# Patient Record
Sex: Male | Born: 1940 | Race: White | Hispanic: No | Marital: Married | Smoking: Former smoker
Health system: Southern US, Community
[De-identification: ages and names within clinical notes are randomized; demographics above are authoritative.]

## PROBLEM LIST (undated history)

## (undated) DIAGNOSIS — C801 Malignant (primary) neoplasm, unspecified: Secondary | ICD-10-CM

## (undated) DIAGNOSIS — R0981 Nasal congestion: Secondary | ICD-10-CM

## (undated) DIAGNOSIS — C880 Waldenstrom macroglobulinemia: Secondary | ICD-10-CM

## (undated) DIAGNOSIS — R531 Weakness: Secondary | ICD-10-CM

## (undated) DIAGNOSIS — D510 Vitamin B12 deficiency anemia due to intrinsic factor deficiency: Secondary | ICD-10-CM

## (undated) DIAGNOSIS — R634 Abnormal weight loss: Secondary | ICD-10-CM

## (undated) DIAGNOSIS — J449 Chronic obstructive pulmonary disease, unspecified: Secondary | ICD-10-CM

## (undated) DIAGNOSIS — R509 Fever, unspecified: Secondary | ICD-10-CM

## (undated) DIAGNOSIS — R6883 Chills (without fever): Secondary | ICD-10-CM

## (undated) DIAGNOSIS — E785 Hyperlipidemia, unspecified: Secondary | ICD-10-CM

## (undated) DIAGNOSIS — F1910 Other psychoactive substance abuse, uncomplicated: Secondary | ICD-10-CM

## (undated) DIAGNOSIS — R591 Generalized enlarged lymph nodes: Secondary | ICD-10-CM

## (undated) HISTORY — DX: Other psychoactive substance abuse, uncomplicated: F19.10

## (undated) HISTORY — DX: Abnormal weight loss: R63.4

## (undated) HISTORY — DX: Waldenstrom macroglobulinemia: C88.0

## (undated) HISTORY — DX: Vitamin B12 deficiency anemia due to intrinsic factor deficiency: D51.0

## (undated) HISTORY — DX: Nasal congestion: R09.81

## (undated) HISTORY — DX: Chronic obstructive pulmonary disease, unspecified: J44.9

## (undated) HISTORY — DX: Weakness: R53.1

## (undated) HISTORY — DX: Hyperlipidemia, unspecified: E78.5

## (undated) HISTORY — DX: Malignant (primary) neoplasm, unspecified: C80.1

## (undated) HISTORY — DX: Fever, unspecified: R50.9

## (undated) HISTORY — DX: Chills (without fever): R68.83

## (undated) HISTORY — DX: Generalized enlarged lymph nodes: R59.1

---

## 1998-04-15 HISTORY — PX: BACK SURGERY: SHX140

## 1999-03-19 ENCOUNTER — Encounter: Payer: Self-pay | Admitting: Family Medicine

## 1999-03-19 ENCOUNTER — Encounter: Admission: RE | Admit: 1999-03-19 | Discharge: 1999-03-19 | Payer: Self-pay | Admitting: Family Medicine

## 1999-04-04 ENCOUNTER — Encounter: Payer: Self-pay | Admitting: Neurosurgery

## 1999-04-06 ENCOUNTER — Encounter: Payer: Self-pay | Admitting: Neurosurgery

## 1999-04-06 ENCOUNTER — Ambulatory Visit (HOSPITAL_COMMUNITY): Admission: RE | Admit: 1999-04-06 | Discharge: 1999-04-06 | Payer: Self-pay | Admitting: Neurosurgery

## 2001-10-07 ENCOUNTER — Encounter: Admission: RE | Admit: 2001-10-07 | Discharge: 2001-10-07 | Payer: Self-pay | Admitting: Family Medicine

## 2001-10-07 ENCOUNTER — Encounter: Payer: Self-pay | Admitting: Family Medicine

## 2003-08-22 ENCOUNTER — Encounter: Admission: RE | Admit: 2003-08-22 | Discharge: 2003-08-22 | Payer: Self-pay | Admitting: Family Medicine

## 2003-08-29 ENCOUNTER — Encounter: Admission: RE | Admit: 2003-08-29 | Discharge: 2003-08-29 | Payer: Self-pay | Admitting: Family Medicine

## 2003-09-21 ENCOUNTER — Ambulatory Visit (HOSPITAL_COMMUNITY): Admission: RE | Admit: 2003-09-21 | Discharge: 2003-09-21 | Payer: Self-pay | Admitting: Family Medicine

## 2004-08-13 ENCOUNTER — Emergency Department (HOSPITAL_COMMUNITY): Admission: EM | Admit: 2004-08-13 | Discharge: 2004-08-13 | Payer: Self-pay | Admitting: Family Medicine

## 2004-09-03 ENCOUNTER — Encounter: Admission: RE | Admit: 2004-09-03 | Discharge: 2004-09-03 | Payer: Self-pay | Admitting: Family Medicine

## 2004-09-19 ENCOUNTER — Ambulatory Visit: Payer: Self-pay | Admitting: Pulmonary Disease

## 2004-09-25 ENCOUNTER — Ambulatory Visit: Payer: Self-pay | Admitting: Pulmonary Disease

## 2004-10-09 ENCOUNTER — Encounter (INDEPENDENT_AMBULATORY_CARE_PROVIDER_SITE_OTHER): Payer: Self-pay | Admitting: *Deleted

## 2004-10-09 ENCOUNTER — Ambulatory Visit: Admission: RE | Admit: 2004-10-09 | Discharge: 2004-10-09 | Payer: Self-pay | Admitting: Pulmonary Disease

## 2004-10-09 ENCOUNTER — Ambulatory Visit: Payer: Self-pay | Admitting: Pulmonary Disease

## 2004-11-01 ENCOUNTER — Ambulatory Visit: Payer: Self-pay | Admitting: Pulmonary Disease

## 2004-12-31 ENCOUNTER — Encounter: Admission: RE | Admit: 2004-12-31 | Discharge: 2004-12-31 | Payer: Self-pay | Admitting: Pulmonary Disease

## 2008-08-10 ENCOUNTER — Encounter: Admission: RE | Admit: 2008-08-10 | Discharge: 2008-08-10 | Payer: Self-pay | Admitting: Family Medicine

## 2009-01-01 ENCOUNTER — Encounter: Payer: Self-pay | Admitting: Emergency Medicine

## 2009-01-02 ENCOUNTER — Inpatient Hospital Stay (HOSPITAL_COMMUNITY): Admission: RE | Admit: 2009-01-02 | Discharge: 2009-01-05 | Payer: Self-pay

## 2009-10-30 ENCOUNTER — Encounter: Admission: RE | Admit: 2009-10-30 | Discharge: 2009-10-30 | Payer: Self-pay | Admitting: Internal Medicine

## 2010-02-14 ENCOUNTER — Encounter (INDEPENDENT_AMBULATORY_CARE_PROVIDER_SITE_OTHER): Payer: Self-pay | Admitting: *Deleted

## 2010-03-05 ENCOUNTER — Encounter (INDEPENDENT_AMBULATORY_CARE_PROVIDER_SITE_OTHER): Payer: Self-pay | Admitting: *Deleted

## 2010-03-07 ENCOUNTER — Ambulatory Visit: Payer: Self-pay | Admitting: Gastroenterology

## 2010-03-27 ENCOUNTER — Ambulatory Visit: Payer: Self-pay | Admitting: Gastroenterology

## 2010-05-15 NOTE — Miscellaneous (Signed)
Summary: [facility] Previsit/prep  Clinical Lists Changes  Medications: Added new medication of MOVIPREP 100 GM  SOLR (PEG-KCL-NACL-NASULF-NA ASC-C) As per prep instructions. - Signed Rx of MOVIPREP 100 GM  SOLR (PEG-KCL-NACL-NASULF-NA ASC-C) As per prep instructions.;  #1 x 0;  Signed;  Entered by: Wyona Almas RN;  Authorized by: Louis Meckel MD;  Method used: Print then Give to Patient Observations: Added new observation of NKA: T (03/07/2010 7:43)    Prescriptions: MOVIPREP 100 GM  SOLR (PEG-KCL-NACL-NASULF-NA ASC-C) As per prep instructions.  #1 x 0   Entered by:   Wyona Almas RN   Authorized by:   Louis Meckel MD   Signed by:   Wyona Almas RN on 03/07/2010   Method used:   Print then Give to Patient   RxID:   [id number]   Appended Document: [facility] Previsit/prep    Clinical Lists Changes  Medications: Added new medication of MOVIPREP 100 GM  SOLR (PEG-KCL-NACL-NASULF-NA ASC-C) As per prep instructions. - Signed Removed medication of MOVIPREP 100 GM  SOLR (PEG-KCL-NACL-NASULF-NA ASC-C) As per prep instructions. Rx of MOVIPREP 100 GM  SOLR (PEG-KCL-NACL-NASULF-NA ASC-C) As per prep instructions.;  #1 x 0;  Signed;  Entered by: Wyona Almas RN;  Authorized by: Louis Meckel MD;  Method used: Electronically to [facility]  [phone]*, [address], [place], [place], [place]  [address], Ph: [phone] or [phone], Fax: [phone]    Prescriptions: MOVIPREP 100 GM  SOLR (PEG-KCL-NACL-NASULF-NA ASC-C) As per prep instructions.  #1 x 0   Entered by:   Wyona Almas RN   Authorized by:   Louis Meckel MD   Signed by:   Wyona Almas RN on 03/07/2010   Method used:   Electronically to        [facility]  [phone]* (retail)       [address]       [place], [place]  [address]       Ph: [phone] or [phone]       Fax: [phone]   RxID:   [id number]

## 2010-05-15 NOTE — Letter (Signed)
Summary: [facility] Instructions  [facility]  [address] [place], [place] [address]   Phone: [phone]  Fax: [phone]       [name] [name]    1940/12/19    MRN: [id number]        Procedure Day /Date: Tuesday 03-27-10     Arrival Time: 10:00 a.m.     Procedure Time: 11:00 a.m.     Location of Procedure:                    _x _   [facility] (4th Floor)                        PREPARATION FOR COLONOSCOPY WITH MOVIPREP   Starting 5 days prior to your procedure  03-22-10 do not eat nuts, seeds, popcorn, corn, beans, peas,  salads, or any raw vegetables.  Do not take any fiber supplements (e.g. Metamucil, Citrucel, and Benefiber).  THE DAY BEFORE YOUR PROCEDURE         DATE:  03-26-10  DAY:  Monday  1.  Drink clear liquids the entire day-NO SOLID FOOD  2.  Do not drink anything colored red or purple.  Avoid juices with pulp.  No orange juice.  3.  Drink at least 64 oz. (8 glasses) of fluid/clear liquids during the day to prevent dehydration and help the prep work efficiently.  CLEAR LIQUIDS INCLUDE: Water Jello Ice Popsicles Tea (sugar ok, no milk/cream) Powdered fruit flavored drinks Coffee (sugar ok, no milk/cream) Gatorade Juice: apple, white grape, white cranberry  Lemonade Clear bullion, consomm, broth Carbonated beverages (any kind) Strained chicken noodle soup Hard Candy                             4.  In the morning, mix first dose of MoviPrep solution:    Empty 1 Pouch A and 1 Pouch B into the disposable container    Add lukewarm drinking water to the top line of the container. Mix to dissolve    Refrigerate (mixed solution should be used within 24 hrs)  5.  Begin drinking the prep at 5:00 p.m. The MoviPrep container is divided by 4 marks.   Every 15 minutes drink the solution down to the next mark (approximately 8 oz) until the full liter is complete.   6.  Follow completed prep with 16 oz of clear liquid of your choice  (Nothing red or purple).  Continue to drink clear liquids until bedtime.  7.  Before going to bed, mix second dose of MoviPrep solution:    Empty 1 Pouch A and 1 Pouch B into the disposable container    Add lukewarm drinking water to the top line of the container. Mix to dissolve    Refrigerate  THE DAY OF YOUR PROCEDURE      DATE:  03-27-10   DAY:  Tuesday  Beginning at  6:00 a.m. (5 hours before procedure):         1. Every 15 minutes, drink the solution down to the next mark (approx 8 oz) until the full liter is complete.  2. Follow completed prep with 16 oz. of clear liquid of your choice.    3. You may drink clear liquids until  9:00 a.m. (2 HOURS BEFORE PROCEDURE).   MEDICATION INSTRUCTIONS  Unless otherwise instructed, you should take regular prescription medications with a small sip of water  as early as possible the morning of your procedure.         OTHER INSTRUCTIONS  You will need a responsible adult at least 69 years of age to accompany you and drive you home.   This person must remain in the waiting room during your procedure.  Wear loose fitting clothing that is easily removed.  Leave jewelry and other valuables at home.  However, you may wish to bring a book to read or  an iPod/MP3 player to listen to music as you wait for your procedure to start.  Remove all body piercing jewelry and leave at home.  Total time from sign-in until discharge is approximately 2-3 hours.  You should go home directly after your procedure and rest.  You can resume normal activities the  day after your procedure.  The day of your procedure you should not:   Drive   Make legal decisions   Operate machinery   Drink alcohol   Return to work  You will receive specific instructions about eating, activities and medications before you leave.    The above instructions have been reviewed and explained to me by   Wyona Almas RN  March 07, 2010 8:23 AM     I  fully understand and can verbalize these instructions _____________________________ Date _________

## 2010-05-15 NOTE — Letter (Signed)
Summary: Pre Visit Letter Revised  [facility]  [address] [place], [place] [address]   Phone: [phone]  Fax: [phone]        02/14/2010 MRN: [id number] [facility] [address] [place], [place]  [address]             Procedure Date:  03/27/2010   Welcome to the Gastroenterology Division at [facility].    You are scheduled to see a nurse for your pre-procedure visit on 03/07/2010 at 8:00AM on the 3rd floor at [facility], [address]  We ask that you try to arrive at our office 15 minutes prior to your appointment time to allow for check-in.  Please take a minute to review the attached form.  If you answer "Yes" to one or more of the questions on the first page, we ask that you call the person listed at your earliest opportunity.  If you answer "No" to all of the questions, please complete the rest of the form and bring it to your appointment.    Your nurse visit will consist of discussing your medical and surgical history, your immediate family medical history, and your medications.   If you are unable to list all of your medications on the form, please bring the medication bottles to your appointment and we will list them.  We will need to be aware of both prescribed and over the counter drugs.  We will need to know exact dosage information as well.    Please be prepared to read and sign documents such as consent forms, a financial agreement, and acknowledgement forms.  If necessary, and with your consent, a friend or relative is welcome to sit-in on the nurse visit with you.  Please bring your insurance card so that we may make a copy of it.  If your insurance requires a referral to see a specialist, please bring your referral form from your primary care physician.  No co-pay is required for this nurse visit.     If you cannot keep your appointment, please call [phone] to cancel or reschedule prior to your appointment date.  This  allows [place] the opportunity to schedule an appointment for another patient in need of care.    Thank you for choosing [facility] for your medical needs.  We appreciate the opportunity to care for you.  Please visit [place] at our website  to learn more about our practice.  Sincerely, The Gastroenterology Division

## 2010-05-17 NOTE — Procedures (Signed)
Summary: Colonoscopy  Patient: [name] [name] Note: All result statuses are Final unless otherwise noted.  Tests: (1) Colonoscopy (COL)   COL Colonoscopy           DONE      [facility]     [address]     [place], [place]  [address]           COLONOSCOPY PROCEDURE REPORT           PATIENT:  [name], [name]  MR#:  [id number]     BIRTHDATE:  [birth year], 69 yrs. old  GENDER:  male           ENDOSCOPIST:  [name] Hair. Arlyce Dice, MD     Referred by:  [name] [name], M.D.           PROCEDURE DATE:  03/27/2010     PROCEDURE:  Diagnostic Colonoscopy     ASA CLASS:  Class II     INDICATIONS:  1) screening  2) history of pre-cancerous     (adenomatous) colon polyps Index polypectomy 2006           MEDICATIONS:   Fentanyl 100 mcg IV, Versed 10 mg IV           DESCRIPTION OF PROCEDURE:   After the risks benefits and     alternatives of the procedure were thoroughly explained, informed     consent was obtained.  Digital rectal exam was performed and     revealed no abnormalities.   The LB CF-[id number] endoscope     was introduced through the anus and advanced to the cecum, which     was identified by both the appendix and ileocecal valve, without     limitations.  The quality of the prep was excellent, using     MoviPrep.  The instrument was then slowly withdrawn as the colon     was fully examined.     <<PROCEDUREIMAGES>>           FINDINGS:  Mild diverticulosis was found in the sigmoid colon (see     image2).  Diverticula were found in the ascending colon (see     image4).  This was otherwise a normal examination of the colon     (see image3, image6, image9, image10, image13, and image14).     Retroflexed views in the rectum revealed no abnormalities.    The     time to cecum =  4.0  minutes. The scope was then withdrawn (time     =  5.0  min) from the patient and the procedure completed.           COMPLICATIONS:  None           ENDOSCOPIC IMPRESSION:     1) Mild diverticulosis  in the sigmoid colon     2) Diverticula in the ascending colon     3) Otherwise normal examination     RECOMMENDATIONS:     1) Colonoscopy           REPEAT EXAM:   10 year(s) Colonoscopy           ______________________________     [name] Hair. Arlyce Dice, MD           CC:           n.     eSIGNED:   Barbette Hair. [name] at 03/27/2010 12:02 PM           [name] [name], [id number]  Note: An  exclamation mark (!) indicates a result that was not dispersed into the flowsheet. Document Creation Date: 03/27/2010 12:05 PM _______________________________________________________________________  (1) Order result status: Final Collection or observation date-time: 03/27/2010 11:56 Requested date-time:  Receipt date-time:  Reported date-time:  Referring Physician:   Ordering Physician: [name] [name] [phone]) Specimen Source:  Source: [name] [name] Order Number: [id number] Lab site:   Appended Document: Colonoscopy    Clinical Lists Changes  Observations: Added new observation of COLONNXTDUE: 03/2020 (03/27/2010 12:20)

## 2010-05-18 ENCOUNTER — Other Ambulatory Visit: Payer: Self-pay | Admitting: Dermatology

## 2010-07-20 LAB — BASIC METABOLIC PANEL
BUN: 6 mg/dL (ref 6–23)
BUN: 7 mg/dL (ref 6–23)
CO2: 30 mEq/L (ref 19–32)
Chloride: 96 mEq/L (ref 96–112)
Creatinine, Ser: 0.66 mg/dL (ref 0.4–1.5)
GFR calc non Af Amer: >60 mL/min (ref >60)
GFR calc non Af Amer: >60 mL/min (ref >60)
Potassium: 3.9 mEq/L (ref 3.5–5.1)
Potassium: 4.3 mEq/L (ref 3.5–5.1)
Sodium: 134 mEq/L — ABNORMAL LOW (ref 135–145)

## 2010-07-20 LAB — CBC
HCT: 37.3 % — ABNORMAL LOW (ref 39.0–52.0)
HCT: 33.7 % — ABNORMAL LOW (ref 39.0–52.0)
Hemoglobin: 11.6 g/dL — ABNORMAL LOW (ref 13.0–17.0)
Hemoglobin: 12.7 g/dL — ABNORMAL LOW (ref 13.0–17.0)
MCHC: 34.6 g/dL (ref 30.0–36.0)
MCV: 95.5 fL (ref 78.0–100.0)
MCV: 95.9 fL (ref 78.0–100.0)
MCV: 96.6 fL (ref 78.0–100.0)
Platelets: 171 10*3/uL (ref 150–400)
Platelets: 152 10*3/uL (ref 150–400)
Platelets: 172 10*3/uL (ref 150–400)
RDW: 13.1 % (ref 11.5–15.5)
RDW: 13.3 % (ref 11.5–15.5)
RDW: 14.1 % (ref 11.5–15.5)
WBC: 7.2 10*3/uL (ref 4.0–10.5)
WBC: 7.9 10*3/uL (ref 4.0–10.5)

## 2010-07-20 LAB — URINALYSIS, ROUTINE W REFLEX MICROSCOPIC
Bilirubin Urine: NEGATIVE
Hgb urine dipstick: NEGATIVE
Nitrite: NEGATIVE
Protein, ur: NEGATIVE mg/dL
pH: 6 (ref 5.0–8.0)

## 2010-07-20 LAB — COMPREHENSIVE METABOLIC PANEL
ALT: 21 U/L (ref 0–53)
CO2: 25 mEq/L (ref 19–32)
Creatinine, Ser: 0.75 mg/dL (ref 0.4–1.5)
GFR calc Af Amer: >60 mL/min (ref >60)
GFR calc non Af Amer: >60 mL/min (ref >60)
Glucose, Bld: 125 mg/dL — ABNORMAL HIGH (ref 70–99)
Potassium: 4.1 mEq/L (ref 3.5–5.1)
Sodium: 132 mEq/L — ABNORMAL LOW (ref 135–145)
Total Bilirubin: 0.4 mg/dL (ref 0.3–1.2)
Total Protein: 7.1 g/dL (ref 6.0–8.3)

## 2010-07-20 LAB — POCT CARDIAC MARKERS: Troponin i, poc: <0.05 ng/mL (ref 0.00–0.09)

## 2010-07-20 LAB — DIFFERENTIAL
Basophils Absolute: 0 10*3/uL (ref 0.0–0.1)
Basophils Relative: 0 % (ref 0–1)
Lymphs Abs: 2.5 10*3/uL (ref 0.7–4.0)
Monocytes Absolute: 1.3 10*3/uL — ABNORMAL HIGH (ref 0.1–1.0)
Neutrophils Relative %: 66 % (ref 43–77)

## 2010-07-20 LAB — PROTIME-INR: Prothrombin Time: 12.8 seconds (ref 11.6–15.2)

## 2010-07-20 LAB — TYPE AND SCREEN

## 2010-07-20 LAB — APTT: aPTT: 26 seconds (ref 24–37)

## 2010-07-20 LAB — ABO/RH: ABO/RH(D): A NEG

## 2010-08-31 NOTE — Op Note (Signed)
NAME:  [name] [name], [name] [name]                  ACCOUNT NO.:  [id number]   MEDICAL RECORD NO.:  [id number]          PATIENT TYPE:  AMB   LOCATION:  CARD                         FACILITY:  [facility]   PHYSICIAN:  Marcelyn Bruins, M.D. [facility] DATE OF BIRTH:  Jun 26, 1940   DATE OF PROCEDURE:  10/09/2004  DATE OF DISCHARGE:                                 OPERATIVE REPORT   PROCEDURE:  Flexible fiberoptic bronchoscopy with biopsy.   INDICATION:  Innumerable pulmonary nodules of unknown etiology.   OPERATOR:  Dr. Shelle Iron.   ANESTHESIA:  Demerol 100 mg IV, Versed 10 mg IV, and topical 1% lidocaine in  both __________ and airways during the procedure.   DESCRIPTION:  After obtaining informed consent and under close  cardiopulmonary monitoring, the above preop anesthesia was given, and the  fiberoptic scope was passed through the right naris and into the posterior  pharynx where there were no lesions or other abnormalities seen.  Vocal  cords appeared to be within normal limits and moved bilaterally with  phonation.  The scope was then passed into the trachea where it was examined  along its entire length down to the level of the carina all of which was  normal.  The left and right tracheobronchial trees were examined serially to  the subsegmental level with no endobronchial abnormality being found.  Bronchoalveolar lavage was then done from the right upper lobe and the right  lower lobe with good return being obtained.  This was sent for the usual  cytologic and bacteriologic evaluation as well as cell count and  differential.  Transbronchial biopsies were then done under fluoroscopic  guidance from the right lower lobe and right middle lobe with good biopsies  and specimens being obtained.  There was no obvious pneumothorax using  fluoroscopy postprocedure.  Overall, the patient tolerated the procedure  well, and there were no complications.  A chest x-ray is pending at time of  dictation to formally rule out  pneumothorax postbiopsy.       KC/MEDQ  D:  10/09/2004  T:  10/09/2004  Job:  [id number]

## 2011-05-17 ENCOUNTER — Ambulatory Visit: Payer: Medicare Other

## 2011-05-17 ENCOUNTER — Ambulatory Visit (INDEPENDENT_AMBULATORY_CARE_PROVIDER_SITE_OTHER): Payer: Medicare Other | Admitting: Family Medicine

## 2011-05-17 VITALS — BP 112/68 | HR 96 | Temp 97.5°F | Resp 20 | Ht 68.0 in | Wt 148.8 lb

## 2011-05-17 DIAGNOSIS — R0600 Dyspnea, unspecified: Secondary | ICD-10-CM

## 2011-05-17 DIAGNOSIS — E785 Hyperlipidemia, unspecified: Secondary | ICD-10-CM

## 2011-05-17 DIAGNOSIS — R0609 Other forms of dyspnea: Secondary | ICD-10-CM

## 2011-05-17 DIAGNOSIS — R634 Abnormal weight loss: Secondary | ICD-10-CM

## 2011-05-17 DIAGNOSIS — H902 Conductive hearing loss, unspecified: Secondary | ICD-10-CM

## 2011-05-17 LAB — CBC WITH DIFFERENTIAL/PLATELET
Basophils Absolute: 0 10*3/uL (ref 0.0–0.1)
Basophils Relative: 1 % (ref 0–1)
Eosinophils Absolute: 0.1 10*3/uL (ref 0.0–0.7)
Eosinophils Relative: 2 % (ref 0–5)
HCT: 33.4 % — ABNORMAL LOW (ref 39.0–52.0)
Hemoglobin: 10.3 g/dL — ABNORMAL LOW (ref 13.0–17.0)
Lymphocytes Relative: 26 % (ref 12–46)
Lymphs Abs: 2.1 10*3/uL (ref 0.7–4.0)
MCH: 24.4 pg — ABNORMAL LOW (ref 26.0–34.0)
MCHC: 30.8 g/dL (ref 30.0–36.0)
MCV: 79.1 fL (ref 78.0–100.0)
Monocytes Absolute: 1.3 10*3/uL — ABNORMAL HIGH (ref 0.1–1.0)
Monocytes Relative: 17 % — ABNORMAL HIGH (ref 3–12)
Neutro Abs: 4.6 10*3/uL (ref 1.7–7.7)
Neutrophils Relative %: 57 % (ref 43–77)
Platelets: 297 10*3/uL (ref 150–400)
RBC: 4.22 MIL/uL (ref 4.22–5.81)
RDW: 15.8 % — ABNORMAL HIGH (ref 11.5–15.5)
WBC: 8.1 10*3/uL (ref 4.0–10.5)

## 2011-05-17 LAB — POCT UA - MICROSCOPIC ONLY
Bacteria, U Microscopic: NEGATIVE
Casts, Ur, LPF, POC: NEGATIVE
Crystals, Ur, HPF, POC: NEGATIVE
Epithelial cells, urine per micros: NEGATIVE
Yeast, UA: NEGATIVE

## 2011-05-17 LAB — COMPREHENSIVE METABOLIC PANEL
ALT: <8 U/L (ref 0–53)
AST: 7 U/L (ref 0–37)
Albumin: 3.1 g/dL — ABNORMAL LOW (ref 3.5–5.2)
Alkaline Phosphatase: 64 U/L (ref 39–117)
BUN: 10 mg/dL (ref 6–23)
CO2: 25 mEq/L (ref 19–32)
Calcium: 9.3 mg/dL (ref 8.4–10.5)
Chloride: 98 mEq/L (ref 96–112)
Creat: 0.68 mg/dL (ref 0.50–1.35)
Glucose, Bld: 107 mg/dL — ABNORMAL HIGH (ref 70–99)
Potassium: 4.6 mEq/L (ref 3.5–5.3)
Sodium: 137 mEq/L (ref 135–145)
Total Bilirubin: 0.5 mg/dL (ref 0.3–1.2)
Total Protein: 7.9 g/dL (ref 6.0–8.3)

## 2011-05-17 LAB — POCT URINALYSIS DIPSTICK
Blood, UA: NEGATIVE
Glucose, UA: NEGATIVE
Leukocytes, UA: NEGATIVE
Nitrite, UA: NEGATIVE
Spec Grav, UA: 1.015
Urobilinogen, UA: 1
pH, UA: 6

## 2011-05-17 LAB — IFOBT (OCCULT BLOOD): IFOBT: NEGATIVE

## 2011-05-17 LAB — POCT SEDIMENTATION RATE: POCT SED RATE: 123 mm/hr — AB (ref 0–22)

## 2011-05-17 LAB — TSH: TSH: 3.518 u[IU]/mL (ref 0.350–4.500)

## 2011-05-17 NOTE — Patient Instructions (Signed)
Stop drinking alcohol and return for lab results on Monday morning

## 2011-05-17 NOTE — Progress Notes (Signed)
  Subjective:    Patient ID: [name] [name], male    DOB: [birth year], 71 y.o.   MRN: [id number]  Shortness of Breath This is a chronic problem. The current episode started more than 1 year ago. The problem occurs constantly. The problem has been gradually worsening. Pertinent negatives include no abdominal pain, chest pain, claudication, coryza, ear pain, fever, headaches, orthopnea, PND, rash, rhinorrhea, sore throat, sputum production, syncope, vomiting or wheezing. Hemoptysis: night sweats. The symptoms are aggravated by any activity and occupational exposure. Risk factors include no known risk factors. He has tried beta agonist inhalers for the symptoms. The treatment provided no relief.   Ex-smoker   Review of Systems  Constitutional: Positive for chills and fatigue. Negative for fever.  HENT: Negative for ear pain, congestion, sore throat, facial swelling, rhinorrhea and neck stiffness.   Respiratory: Positive for shortness of breath. Negative for cough (rare nonproductive cough), sputum production and wheezing. Hemoptysis: night sweats.   Cardiovascular: Negative for chest pain, orthopnea, claudication, syncope and PND.  Gastrointestinal: Negative for vomiting and abdominal pain.  Musculoskeletal: Negative.   Skin: Negative for rash.  Neurological: Negative for headaches.  Hematological: Negative.        Objective:   Physical Exam  Constitutional: He is oriented to person, place, and time. He appears well-developed and well-nourished.  HENT:  Head: Normocephalic and atraumatic.  Mouth/Throat: Oropharynx is clear and moist.  Eyes: Conjunctivae are normal. Pupils are equal, round, and reactive to light.  Neck: Normal range of motion. Neck supple.  Cardiovascular: Normal rate, regular rhythm, normal heart sounds and intact distal pulses.   Pulmonary/Chest: Effort normal and breath sounds normal.  Abdominal: Soft. Bowel sounds are normal.  Musculoskeletal: Normal range of motion.    Neurological: He is alert and oriented to person, place, and time.  Skin: Skin is warm and dry.  Psychiatric: He has a normal mood and affect. His behavior is normal.   [facility] reading (PRIMARY) by  Dr. Milus Glazier:  Old left rib fx's.  No mass  ESR 123 U/A neg  I spent the better part of an hour with patient and significant other  Pulse ox was 90 initially and went up to 95 with walking     Assessment & Plan:  Weight loss and dyspnea on exertion which needs further investigation.  The former may be from alcohol overuse and the latter COPD.  We will need further referrals which have been made.  Also we will get an audiology referral.

## 2011-05-18 ENCOUNTER — Encounter: Payer: Self-pay | Admitting: Family Medicine

## 2011-05-20 ENCOUNTER — Ambulatory Visit (INDEPENDENT_AMBULATORY_CARE_PROVIDER_SITE_OTHER): Payer: Medicare Other | Admitting: Family Medicine

## 2011-05-20 ENCOUNTER — Encounter: Payer: Self-pay | Admitting: Family Medicine

## 2011-05-20 ENCOUNTER — Telehealth: Payer: Self-pay

## 2011-05-20 VITALS — BP 108/68 | HR 100 | Temp 98.8°F | Resp 16 | Ht 68.5 in | Wt 157.0 lb

## 2011-05-20 DIAGNOSIS — F1020 Alcohol dependence, uncomplicated: Secondary | ICD-10-CM

## 2011-05-20 DIAGNOSIS — J449 Chronic obstructive pulmonary disease, unspecified: Secondary | ICD-10-CM | POA: Insufficient documentation

## 2011-05-20 DIAGNOSIS — R0600 Dyspnea, unspecified: Secondary | ICD-10-CM

## 2011-05-20 DIAGNOSIS — R634 Abnormal weight loss: Secondary | ICD-10-CM

## 2011-05-20 DIAGNOSIS — D649 Anemia, unspecified: Secondary | ICD-10-CM

## 2011-05-20 DIAGNOSIS — F1010 Alcohol abuse, uncomplicated: Secondary | ICD-10-CM

## 2011-05-20 DIAGNOSIS — R5381 Other malaise: Secondary | ICD-10-CM

## 2011-05-20 MED ORDER — ALBUTEROL SULFATE HFA 108 (90 BASE) MCG/ACT IN AERS: 2.0000 | INHALATION_SPRAY | Freq: Four times a day (QID) | RESPIRATORY_TRACT | Status: DC | PRN

## 2011-05-20 MED ORDER — METHYLPREDNISOLONE 4 MG PO KIT: PACK | ORAL | Status: AC

## 2011-05-20 MED ORDER — PRENATAL RX 60-1 MG PO TABS: 1.0000 | ORAL_TABLET | Freq: Every day | ORAL | Status: DC

## 2011-05-20 NOTE — Telephone Encounter (Signed)
Patient wife called to let Dr. Milus Glazier know that patient would not be coming in till after his ct scan instead of coming in Tuesday at 430pm, patient will come in on tues or Friday of next week.

## 2011-05-20 NOTE — Progress Notes (Signed)
71-year-old gentleman comes in for a recheck. His main complaint is weakness of the legs and 30 pound weight loss over the last 3 months. In addition he has hearing loss and dyspnea. I saw him today with his ex-wife who takes care of him. She explains that he drinks 3 sixpacks of beer every day and that he just sits around and does nothing.  The patient had a colonoscopy several years ago and told that he did not have to come back for 10 years patient denies cough nausea vomiting blood in stool or chest pain. HBG 10.7, ESR 123  Objective: Labs were reviewed today. The anemia in particular was identified and the patient was told that he should not drink anymore.  Chest was clear abdomen was soft without hepatosplenomegaly, and no tenderness. He is alert and cooperative and was ambulating normally.  Assessment: Abnormal weight loss possibly related to alcoholism but also there is a possibility the patient has an occult cancer.  Plan: CT of abdomen, prenatal vitamins, referrals have been made to audiology. I will see the patient back in one week and he is instructed to avoid alcohol. I spent 35 minutes with the patient and his ex-wife outlining the plan and reviewing laboratory results.

## 2011-05-20 NOTE — Patient Instructions (Signed)
Return next Monday around 8:15

## 2011-05-21 NOTE — Telephone Encounter (Signed)
See message. FYI. 

## 2011-05-22 ENCOUNTER — Ambulatory Visit
Admission: RE | Admit: 2011-05-22 | Discharge: 2011-05-22 | Disposition: A | Discharge status: Home / Self Care | Payer: Medicare Other | Source: Ambulatory Visit | Attending: Family Medicine | Admitting: Family Medicine

## 2011-05-22 DIAGNOSIS — R634 Abnormal weight loss: Secondary | ICD-10-CM

## 2011-05-22 MED ORDER — IOHEXOL 300 MG/ML  SOLN
100.0000 mL | Freq: Once | INTRAMUSCULAR | Status: AC | PRN
  Administered 2011-05-22: 100 mL via INTRAVENOUS

## 2011-05-23 ENCOUNTER — Telehealth: Payer: Self-pay

## 2011-05-23 NOTE — Telephone Encounter (Signed)
.  [facility] BETTY WOULD LIKE TO KNOW RESULTS OF HER HUSBAND'S MRI PLEASE CALL HER AT [phone], HE IS HARD OF HEARING

## 2011-05-24 ENCOUNTER — Other Ambulatory Visit: Payer: Self-pay | Admitting: Internal Medicine

## 2011-05-24 DIAGNOSIS — R634 Abnormal weight loss: Secondary | ICD-10-CM

## 2011-05-24 DIAGNOSIS — R1909 Other intra-abdominal and pelvic swelling, mass and lump: Secondary | ICD-10-CM

## 2011-05-24 NOTE — Telephone Encounter (Signed)
Ct scan done on Feb 4th showed mass in the right groin.  A follow-up study (Pet scan) has been ordered today to get a closer look at this area to see what the next step is.  Please follow-up here in the clinic a few days after the Pet scan is done so Dr L. Can see how pt is doing.

## 2011-05-25 NOTE — Telephone Encounter (Signed)
Spoke with wife and informed her of the results.  Wife states understanding and will call back if any questions or concerns

## 2011-05-28 ENCOUNTER — Institutional Professional Consult (permissible substitution): Payer: Self-pay | Admitting: Critical Care Medicine

## 2011-05-31 ENCOUNTER — Telehealth: Payer: Self-pay

## 2011-05-31 NOTE — Telephone Encounter (Signed)
[employer] called about this pts prior auth for a PET scan. They require addtl info before they can approve. They require labs, previous imaging results, documentation of findings/symptoms, or any info that will prove the existence of a solid tumor before they will approve PET scan. [facility] phone [phone] Fax [phone] Case # [id number]

## 2011-06-01 NOTE — Telephone Encounter (Signed)
[name] [name],   Please get this started.  Thanks, [employer]

## 2011-06-03 NOTE — Telephone Encounter (Signed)
Called [employer] back to check status of precert. PET has been approved. Notification #[id number], exp 07/18/11

## 2011-06-03 NOTE — Telephone Encounter (Signed)
Called [employer] and provided add'l clinical information. Had to go to Phys review, but marked "urgent" - should receive fax within 3 hours with decision if PET approved.

## 2011-06-10 NOTE — Telephone Encounter (Signed)
I'm sure that I routed this back to you after receiving this notification, but am not seeing it on routing hx, so I'll send it again.

## 2011-06-10 NOTE — Telephone Encounter (Signed)
This appears to have been approved so it likely needs to go to referrals to be scheduled.  Mikle Sternberg

## 2011-06-10 NOTE — Telephone Encounter (Signed)
I also had routed message back to Referrals and talked with Lupita Leash to make sure she was aware.

## 2011-06-13 ENCOUNTER — Encounter (HOSPITAL_COMMUNITY)
Admission: RE | Admit: 2011-06-13 | Discharge: 2011-06-13 | Disposition: A | Discharge status: Home / Self Care | Payer: Medicare Other | Source: Ambulatory Visit | Attending: Internal Medicine | Admitting: Internal Medicine

## 2011-06-13 DIAGNOSIS — R634 Abnormal weight loss: Secondary | ICD-10-CM

## 2011-06-13 DIAGNOSIS — R599 Enlarged lymph nodes, unspecified: Secondary | ICD-10-CM | POA: Insufficient documentation

## 2011-06-13 DIAGNOSIS — Z85828 Personal history of other malignant neoplasm of skin: Secondary | ICD-10-CM | POA: Insufficient documentation

## 2011-06-13 DIAGNOSIS — I2510 Atherosclerotic heart disease of native coronary artery without angina pectoris: Secondary | ICD-10-CM | POA: Insufficient documentation

## 2011-06-13 DIAGNOSIS — R1909 Other intra-abdominal and pelvic swelling, mass and lump: Secondary | ICD-10-CM

## 2011-06-13 MED ORDER — FLUDEOXYGLUCOSE F - 18 (FDG) INJECTION
22.0000 | Freq: Once | INTRAVENOUS | Status: AC | PRN
  Administered 2011-06-13: 19.9 via INTRAVENOUS

## 2011-06-15 ENCOUNTER — Other Ambulatory Visit: Payer: Self-pay | Admitting: Family Medicine

## 2011-06-15 DIAGNOSIS — R591 Generalized enlarged lymph nodes: Secondary | ICD-10-CM

## 2011-06-19 ENCOUNTER — Ambulatory Visit (INDEPENDENT_AMBULATORY_CARE_PROVIDER_SITE_OTHER): Payer: Medicare Other | Admitting: Family Medicine

## 2011-06-19 VITALS — BP 98/59 | HR 94 | Temp 98.5°F | Resp 16 | Ht 68.0 in | Wt 145.0 lb

## 2011-06-19 DIAGNOSIS — H9190 Unspecified hearing loss, unspecified ear: Secondary | ICD-10-CM | POA: Insufficient documentation

## 2011-06-19 DIAGNOSIS — R634 Abnormal weight loss: Secondary | ICD-10-CM

## 2011-06-19 NOTE — Progress Notes (Signed)
71 yo man with progressive weight loss, night sweats, weakness, knee pain, but good appetite for the past 1 year.  Since he was seen here 1 month ago, he has completely stopped alcohol.  He continues to lose weight, though. 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  We still don't have an oncology referral made.  O:  Seen with ex-wife Alert. 7-8 cm smooth liver edge MCL RUQ, nontender.  No masses. Heart reg, 80 bpm, no murmur Chest: clear Skin:  Clear.  A:  Suspect lymphoma  P:  Consult Arlan Organ, MD

## 2011-06-21 ENCOUNTER — Telehealth: Payer: Self-pay | Admitting: Hematology & Oncology

## 2011-06-21 NOTE — Telephone Encounter (Signed)
Left pt message to call for appointment °

## 2011-06-28 ENCOUNTER — Other Ambulatory Visit (HOSPITAL_BASED_OUTPATIENT_CLINIC_OR_DEPARTMENT_OTHER): Payer: Medicare Other | Admitting: Lab

## 2011-06-28 ENCOUNTER — Ambulatory Visit (HOSPITAL_BASED_OUTPATIENT_CLINIC_OR_DEPARTMENT_OTHER): Payer: Medicare Other | Admitting: Hematology & Oncology

## 2011-06-28 ENCOUNTER — Ambulatory Visit: Payer: Medicare Other

## 2011-06-28 VITALS — BP 101/62 | HR 94 | Temp 97.2°F | Ht 69.0 in | Wt 147.0 lb

## 2011-06-28 DIAGNOSIS — C8590 Non-Hodgkin lymphoma, unspecified, unspecified site: Secondary | ICD-10-CM

## 2011-06-28 DIAGNOSIS — D649 Anemia, unspecified: Secondary | ICD-10-CM

## 2011-06-28 DIAGNOSIS — R599 Enlarged lymph nodes, unspecified: Secondary | ICD-10-CM

## 2011-06-28 LAB — CHCC SATELLITE - SMEAR

## 2011-06-28 LAB — CBC WITH DIFFERENTIAL (CANCER CENTER ONLY)
Eosinophils Absolute: 0 10*3/uL (ref 0.0–0.5)
MONO#: 1.3 10*3/uL — ABNORMAL HIGH (ref 0.1–0.9)
MONO%: 19 % — ABNORMAL HIGH (ref 0.0–13.0)
NEUT#: 3.6 10*3/uL (ref 1.5–6.5)
Platelets: 245 10*3/uL (ref 145–400)
RBC: 3.8 10*6/uL — ABNORMAL LOW (ref 4.20–5.70)
WBC: 6.7 10*3/uL (ref 4.0–10.0)
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
## 2011-06-28 NOTE — Progress Notes (Signed)
This office note has been dictated.

## 2011-07-02 LAB — PROTEIN ELECTROPHORESIS, SERUM, WITH REFLEX
Beta 2: 31.5 % — ABNORMAL HIGH (ref 3.2–6.5)
Beta Globulin: 5.5 % (ref 4.7–7.2)
Gamma Globulin: 3.6 % — ABNORMAL LOW (ref 11.1–18.8)
M-Spike, %: 2.09 g/dL
Total Protein, Serum Electrophoresis: 6.9 g/dL (ref 6.0–8.3)

## 2011-07-02 LAB — COMPREHENSIVE METABOLIC PANEL
ALT: <8 U/L (ref 0–53)
Albumin: 2.9 g/dL — ABNORMAL LOW (ref 3.5–5.2)
Alkaline Phosphatase: 60 U/L (ref 39–117)
CO2: 25 mEq/L (ref 19–32)
Glucose, Bld: 98 mg/dL (ref 70–99)
Potassium: 4.9 mEq/L (ref 3.5–5.3)
Sodium: 135 mEq/L (ref 135–145)
Total Protein: 6.9 g/dL (ref 6.0–8.3)

## 2011-07-02 LAB — IFE INTERPRETATION

## 2011-07-02 LAB — KAPPA/LAMBDA LIGHT CHAINS
Kappa:Lambda Ratio: 277.27 — ABNORMAL HIGH (ref 0.26–1.65)
Lambda Free Lght Chn: 0.11 mg/dL — ABNORMAL LOW (ref 0.57–2.63)

## 2011-07-02 LAB — IGG, IGA, IGM: IgG (Immunoglobin G), Serum: 262 mg/dL — ABNORMAL LOW (ref 650–1600)

## 2011-07-04 ENCOUNTER — Encounter (INDEPENDENT_AMBULATORY_CARE_PROVIDER_SITE_OTHER): Payer: Self-pay

## 2011-07-05 ENCOUNTER — Ambulatory Visit (INDEPENDENT_AMBULATORY_CARE_PROVIDER_SITE_OTHER): Payer: Medicare Other | Admitting: Surgery

## 2011-07-05 ENCOUNTER — Encounter (INDEPENDENT_AMBULATORY_CARE_PROVIDER_SITE_OTHER): Payer: Self-pay | Admitting: Surgery

## 2011-07-05 VITALS — BP 110/58 | HR 68 | Temp 97.9°F | Resp 16 | Ht 68.0 in | Wt 146.6 lb

## 2011-07-05 DIAGNOSIS — R591 Generalized enlarged lymph nodes: Secondary | ICD-10-CM

## 2011-07-05 DIAGNOSIS — R634 Abnormal weight loss: Secondary | ICD-10-CM

## 2011-07-05 DIAGNOSIS — R599 Enlarged lymph nodes, unspecified: Secondary | ICD-10-CM
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
## 2011-07-05 NOTE — Patient Instructions (Signed)
1.  I will speak to Dr. Myna Hidalgo, then call Mrs. Dawood.

## 2011-07-06 DIAGNOSIS — R591 Generalized enlarged lymph nodes: Secondary | ICD-10-CM | POA: Insufficient documentation

## 2011-07-08 ENCOUNTER — Telehealth: Payer: Self-pay | Admitting: *Deleted

## 2011-07-08 ENCOUNTER — Other Ambulatory Visit: Payer: Self-pay | Admitting: Hematology & Oncology

## 2011-07-08 DIAGNOSIS — R599 Enlarged lymph nodes, unspecified: Secondary | ICD-10-CM

## 2011-07-09 ENCOUNTER — Ambulatory Visit (HOSPITAL_BASED_OUTPATIENT_CLINIC_OR_DEPARTMENT_OTHER): Payer: Medicare Other | Admitting: Hematology & Oncology

## 2011-07-09 ENCOUNTER — Other Ambulatory Visit (HOSPITAL_BASED_OUTPATIENT_CLINIC_OR_DEPARTMENT_OTHER): Payer: Medicare Other | Admitting: Lab

## 2011-07-09 ENCOUNTER — Ambulatory Visit: Payer: Medicare Other

## 2011-07-09 ENCOUNTER — Other Ambulatory Visit (HOSPITAL_COMMUNITY)
Admission: RE | Admit: 2011-07-09 | Discharge: 2011-07-09 | Disposition: A | Discharge status: Home / Self Care | Payer: Medicare Other | Source: Ambulatory Visit | Attending: Hematology & Oncology | Admitting: Hematology & Oncology

## 2011-07-09 VITALS — BP 96/51 | HR 89 | Temp 97.2°F | Wt 141.0 lb

## 2011-07-09 DIAGNOSIS — R591 Generalized enlarged lymph nodes: Secondary | ICD-10-CM

## 2011-07-09 DIAGNOSIS — R599 Enlarged lymph nodes, unspecified: Secondary | ICD-10-CM

## 2011-07-09 DIAGNOSIS — D509 Iron deficiency anemia, unspecified: Secondary | ICD-10-CM | POA: Insufficient documentation

## 2011-07-09 DIAGNOSIS — D6481 Anemia due to antineoplastic chemotherapy: Secondary | ICD-10-CM

## 2011-07-09 DIAGNOSIS — C8589 Other specified types of non-Hodgkin lymphoma, extranodal and solid organ sites: Secondary | ICD-10-CM | POA: Insufficient documentation

## 2011-07-09 LAB — CBC WITH DIFFERENTIAL (CANCER CENTER ONLY)
BASO%: 0.5 % (ref 0.0–2.0)
EOS%: 0.3 % (ref 0.0–7.0)
LYMPH#: 2.1 10*3/uL (ref 0.9–3.3)
MONO#: 1.6 10*3/uL — ABNORMAL HIGH (ref 0.1–0.9)
NEUT#: 3.9 10*3/uL (ref 1.5–6.5)
Platelets: 231 10*3/uL (ref 145–400)
RDW: 16.2 % — ABNORMAL HIGH (ref 11.1–15.7)
WBC: 7.7 10*3/uL (ref 4.0–10.0)

## 2011-07-09 NOTE — Progress Notes (Unsigned)
Opened in error

## 2011-07-09 NOTE — Progress Notes (Signed)
This office note has been dictated.

## 2011-07-09 NOTE — Patient Instructions (Signed)
Bone Marrow Aspiration, Bone Marrow Biopsy Care After Read the instructions outlined below and refer to this sheet in the next few weeks. These discharge instructions provide you with general information on caring for yourself after you leave the hospital. Your caregiver may also give you specific instructions. While your treatment has been planned according to the most current medical practices available, unavoidable complications occasionally occur. If you have any problems or questions after discharge, call Dr. Myna Hidalgo.  FINDING OUT THE RESULTS OF YOUR TEST Not all test results are available during your visit. If your test results are not back during the visit, make an appointment with your caregiver to find out the results. Do not assume everything is normal if you have not heard from your caregiver or the medical facility. It is important for you to follow up on all of your test results.   HOME CARE INSTRUCTIONS   Only take over-the-counter or prescription medicines for pain, discomfort, and or fever as directed by your caregiver.    Keep your dressing clean and dry. You may replace dressing with a bandage after 24 hours.   You may take a bath or shower after 24 hours.   Use an ice pack for 20 minutes every 2 hours while awake for pain as needed.  SEEK MEDICAL CARE IF:   There is redness, swelling, or increasing pain at the biopsy site.   There is pus coming from the biopsy site.   There is drainage from a biopsy site lasting longer than one day.   An unexplained oral temperature above 102 F (38.9 C) develops.  SEEK IMMEDIATE MEDICAL CARE IF:   You develop a rash.   You have difficulty breathing.   You develop any reaction or side effects to medications given.  Document Released: 10/19/2004 Document Revised: 03/21/2011 Document Reviewed: 03/29/2008 [facility] Patient Information 2012 [place], [place].

## 2011-07-09 NOTE — Procedures (Signed)
[name] [name] was brought to the treatment room at the [facility].  We needed to do a bone marrow biopsy on him for his lymphadenopathy.  He signed his consent form.  We did the appropriate time-out procedure.  His left posterior iliac crest region was prepped and draped in sterile fashion.  10 cc of 2% lidocaine was infiltrated under the skin down to the periosteum.  A #11 scalpel was used to make an incision into the skin.  We then obtained an aspirate.  Unfortunately, the aspirate was very dilute without any spicules.  I then tried a second aspirate without much success.  I then used a Jamshidi biopsy needle.  I got about a 5 cm core.  This core will be sent for flow cytometry and cytogenetics.  We will also have it sent off for pathology.  [name] [name] tolerated the procedure well.  I dressed the entry site in the left posterior iliac crest region sterilely.  There were no complications with the procedure.    ______________________________ Josph Macho, M.D. PRE/MEDQ  D:  07/09/2011  T:  07/09/2011  Job:  [id number]

## 2011-07-09 NOTE — Progress Notes (Unsigned)
[facility] BONE MARROW BIOPSY/ASPIRATE PROGRESS NOTE  Patient tolerated well. Pressure dressing applied to the left hip with instructions to leave in place for 24 hours. Patient instructed to report any bleeding that saturates dressing and to take pain medication Tylenol as directed. Dressing dry and intact to the left hip on discharge.

## 2011-07-10 ENCOUNTER — Other Ambulatory Visit: Payer: Self-pay | Admitting: Hematology & Oncology

## 2011-07-10 ENCOUNTER — Encounter: Payer: Self-pay | Admitting: Hematology & Oncology

## 2011-07-10 ENCOUNTER — Telehealth: Payer: Self-pay | Admitting: Hematology & Oncology

## 2011-07-10 DIAGNOSIS — C880 Waldenstrom macroglobulinemia: Secondary | ICD-10-CM

## 2011-07-10 HISTORY — DX: Waldenstrom macroglobulinemia: C88.0

## 2011-07-10 NOTE — Telephone Encounter (Signed)
I called the patient's common-law wife. Although they're not married, she really takes care of all of his medical issues.  I told her that he has a lymphoma. 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  I told her that chemotherapy we have 80-90% chance okay remission  Unfortunately he is drinking quite a bit. I that his alcohol use were too low for this lymph will. I told  her that with chemotherapy, that patients often stop drinking..  I would use Rituxan/Cytoxan/Decadron I think is a very reasonable option for him given his overall performance status and other health issues. I do expect an 80-90% response with this.  I am glad that we did do his bone marrow test. This occurred only gives [place] the diagnosis and that we do not need to proceed with any other testing.  We will try to get started next week.

## 2011-07-11 ENCOUNTER — Telehealth: Payer: Self-pay | Admitting: Hematology & Oncology

## 2011-07-11 NOTE — Telephone Encounter (Signed)
Pt aware of 4-4 and 4-25 appointments

## 2011-07-17 ENCOUNTER — Other Ambulatory Visit: Payer: Self-pay | Admitting: Hematology & Oncology

## 2011-07-18 ENCOUNTER — Other Ambulatory Visit (HOSPITAL_BASED_OUTPATIENT_CLINIC_OR_DEPARTMENT_OTHER): Payer: Medicare Other | Admitting: Lab

## 2011-07-18 ENCOUNTER — Ambulatory Visit: Payer: Medicare Other

## 2011-07-18 ENCOUNTER — Other Ambulatory Visit: Payer: Medicare Other | Admitting: Lab

## 2011-07-18 ENCOUNTER — Ambulatory Visit (HOSPITAL_BASED_OUTPATIENT_CLINIC_OR_DEPARTMENT_OTHER): Payer: Medicare Other

## 2011-07-18 VITALS — BP 90/56 | HR 83 | Temp 97.4°F | Ht 68.0 in | Wt 146.5 lb

## 2011-07-18 DIAGNOSIS — C8589 Other specified types of non-Hodgkin lymphoma, extranodal and solid organ sites: Secondary | ICD-10-CM

## 2011-07-18 DIAGNOSIS — Z5111 Encounter for antineoplastic chemotherapy: Secondary | ICD-10-CM

## 2011-07-18 DIAGNOSIS — C880 Waldenstrom macroglobulinemia: Secondary | ICD-10-CM

## 2011-07-18 DIAGNOSIS — Z5112 Encounter for antineoplastic immunotherapy: Secondary | ICD-10-CM

## 2011-07-18 LAB — CBC WITH DIFFERENTIAL (CANCER CENTER ONLY)
BASO#: 0.1 10*3/uL (ref 0.0–0.2)
BASO%: 0.8 % (ref 0.0–2.0)
HCT: 28.6 % — ABNORMAL LOW (ref 38.7–49.9)
HGB: 8.7 g/dL — ABNORMAL LOW (ref 13.0–17.1)
LYMPH#: 1.8 10*3/uL (ref 0.9–3.3)
MONO#: 1.4 10*3/uL — ABNORMAL HIGH (ref 0.1–0.9)
NEUT#: 3.1 10*3/uL (ref 1.5–6.5)
NEUT%: 49.4 % (ref 40.0–80.0)
WBC: 6.4 10*3/uL (ref 4.0–10.0)

## 2011-07-18 MED ORDER — SODIUM CHLORIDE 0.9 % IV SOLN
600.0000 mg/m2 | Freq: Once | INTRAVENOUS | Status: AC
  Administered 2011-07-18: 1060 mg via INTRAVENOUS
  Filled 2011-07-18: qty 53

## 2011-07-18 MED ORDER — ONDANSETRON HCL 8 MG PO TABS: 8.0000 mg | ORAL_TABLET | Freq: Three times a day (TID) | ORAL | Status: DC | PRN

## 2011-07-18 MED ORDER — PREDNISONE 20 MG PO TABS: ORAL_TABLET | ORAL | Status: DC

## 2011-07-18 MED ORDER — PROMETHAZINE HCL 12.5 MG PO TABS: 12.5000 mg | ORAL_TABLET | Freq: Four times a day (QID) | ORAL | Status: DC | PRN

## 2011-07-18 MED ORDER — VINCRISTINE SULFATE CHEMO INJECTION 1 MG/ML
1.5000 mg | Freq: Once | INTRAVENOUS | Status: AC
  Administered 2011-07-18: 1.5 mg via INTRAVENOUS
  Filled 2011-07-18: qty 1.5

## 2011-07-18 MED ORDER — DIPHENHYDRAMINE HCL 25 MG PO CAPS
50.0000 mg | ORAL_CAPSULE | Freq: Once | ORAL | Status: AC
  Administered 2011-07-18: 50 mg via ORAL

## 2011-07-18 MED ORDER — ONDANSETRON 16 MG/50ML IVPB (CHCC)
16.0000 mg | Freq: Once | INTRAVENOUS | Status: AC
  Administered 2011-07-18: 16 mg via INTRAVENOUS

## 2011-07-18 MED ORDER — SODIUM CHLORIDE 0.9 % IV SOLN
375.0000 mg/m2 | Freq: Once | INTRAVENOUS | Status: AC
  Administered 2011-07-18: 700 mg via INTRAVENOUS
  Filled 2011-07-18: qty 70

## 2011-07-18 MED ORDER — DEXAMETHASONE SODIUM PHOSPHATE 4 MG/ML IJ SOLN
20.0000 mg | Freq: Once | INTRAMUSCULAR | Status: AC
  Administered 2011-07-18: 20 mg via INTRAVENOUS

## 2011-07-18 MED ORDER — SODIUM CHLORIDE 0.9 % IV SOLN
Freq: Once | INTRAVENOUS | Status: AC
  Administered 2011-07-18: 10:00:00 via INTRAVENOUS

## 2011-07-18 MED ORDER — ACETAMINOPHEN 325 MG PO TABS
650.0000 mg | ORAL_TABLET | Freq: Once | ORAL | Status: AC
  Administered 2011-07-18: 650 mg via ORAL

## 2011-07-18 NOTE — Patient Instructions (Signed)
[facility] Discharge Instructions for Patients Receiving Chemotherapy  Today you received the following chemotherapy agents Rituxan, Cytoxan, Vincristine, Prednisone (to start tomorrow)  Prednisone 20 mg - take 3 tabs (60 mg) daily for 4 days starting the day after chemo. Restart with each cycle of treatment.  To help prevent nausea and vomiting after your treatment, we encourage you to take your nausea medication:  Phenergan 12.5 mg - take 1 tab every 6 hours as need for nausea  Zofran 8mg  - take 1 tab every 8 hours as needed for nausea or vomiting --- may take 1 tab every 12 hours starting the day after treatment for 2 days to                        prevent nausea.   Colace - this is a stool softener. Take 100mg  capsule 2-12 times a day as needed. If you have to take more than 6 capsules of Colace a day call the Cancer Center.  Senna - this is a mild laxative used to treat mild constipation. May take 2 tabs by mouth daily or up to twice a day as needed for mild constipation and Milk of Magnesia - this is a laxative used to treat moderate to severe constipation. May take 2-4 tablespoons every 8 hours as needed. May increase to 8 tablespoons x 1 dose and if no bowel movement call the Cancer Center    If you develop nausea and vomiting that is not controlled by your nausea medication, call the clinic. If it is after clinic hours your family physician or the after hours number for the clinic or go to the Emergency Department.   BELOW ARE SYMPTOMS THAT SHOULD BE REPORTED IMMEDIATELY:  *FEVER GREATER THAN 100.5 F  *CHILLS WITH OR WITHOUT FEVER  NAUSEA AND VOMITING THAT IS NOT CONTROLLED WITH YOUR NAUSEA MEDICATION  *UNUSUAL SHORTNESS OF BREATH  *UNUSUAL BRUISING OR BLEEDING  TENDERNESS IN MOUTH AND THROAT WITH OR WITHOUT PRESENCE OF ULCERS  *URINARY PROBLEMS  *BOWEL PROBLEMS  UNUSUAL RASH Items with * indicate a potential emergency and should be followed up as  soon as possible.  One of the nurses will contact you 24 hours after your treatment. Please let the nurse know about any problems that you may have experienced. Feel free to call the clinic you have any questions or concerns. The clinic phone number is [phone].   I have been informed and understand all the instructions given to me. I know to contact the clinic, my physician, or go to the Emergency Department if any problems should occur. I do not have any questions at this time, but understand that I may call the clinic during office hours   should I have any questions or need assistance in obtaining follow up care.    __________________________________________  _____________  __________ Signature of Patient or Authorized Representative            Date                   Time    __________________________________________ Nurse's Signature

## 2011-07-22 ENCOUNTER — Encounter: Payer: Self-pay | Admitting: *Deleted

## 2011-07-22 ENCOUNTER — Other Ambulatory Visit: Payer: Self-pay | Admitting: Family Medicine

## 2011-07-22 ENCOUNTER — Other Ambulatory Visit: Payer: Self-pay | Admitting: *Deleted

## 2011-07-22 LAB — COMPREHENSIVE METABOLIC PANEL
ALT: 8 U/L (ref 0–53)
AST: 9 U/L (ref 0–37)
Albumin: 2.8 g/dL — ABNORMAL LOW (ref 3.5–5.2)
BUN: 7 mg/dL (ref 6–23)
CO2: 28 mEq/L (ref 19–32)
Calcium: 8.7 mg/dL (ref 8.4–10.5)
Chloride: 96 mEq/L (ref 96–112)
Creatinine, Ser: 0.67 mg/dL (ref 0.50–1.35)
Potassium: 4.5 mEq/L (ref 3.5–5.3)

## 2011-07-22 LAB — PROTEIN ELECTROPHORESIS, SERUM, WITH REFLEX
Alpha-2-Globulin: 16 % — ABNORMAL HIGH (ref 7.1–11.8)
Beta Globulin: 5.2 % (ref 4.7–7.2)
Gamma Globulin: 3.5 % — ABNORMAL LOW (ref 11.1–18.8)
M-Spike, %: 2.18 g/dL
Total Protein, Serum Electrophoresis: 7 g/dL (ref 6.0–8.3)

## 2011-07-22 LAB — KAPPA/LAMBDA LIGHT CHAINS: Lambda Free Lght Chn: 0.05 mg/dL — ABNORMAL LOW (ref 0.57–2.63)

## 2011-07-22 LAB — IGG, IGA, IGM: IgG (Immunoglobin G), Serum: 289 mg/dL — ABNORMAL LOW (ref 650–1600)

## 2011-07-22 MED ORDER — LORAZEPAM 0.5 MG PO TABS: ORAL_TABLET | ORAL | Status: DC

## 2011-07-22 MED ORDER — TRAMADOL HCL 50 MG PO TABS: ORAL_TABLET | ORAL | Status: DC

## 2011-07-25 ENCOUNTER — Encounter: Payer: Self-pay | Admitting: Hematology & Oncology

## 2011-07-30 ENCOUNTER — Encounter (HOSPITAL_COMMUNITY): Payer: Self-pay | Admitting: Emergency Medicine

## 2011-07-30 ENCOUNTER — Emergency Department (HOSPITAL_COMMUNITY)
Admission: EM | Admit: 2011-07-30 | Discharge: 2011-07-30 | Disposition: A | Discharge status: Home / Self Care | Payer: Medicare Other | Attending: Emergency Medicine | Admitting: Emergency Medicine

## 2011-07-30 ENCOUNTER — Telehealth: Payer: Self-pay | Admitting: *Deleted

## 2011-07-30 ENCOUNTER — Emergency Department (HOSPITAL_COMMUNITY): Payer: Medicare Other

## 2011-07-30 DIAGNOSIS — Z79899 Other long term (current) drug therapy: Secondary | ICD-10-CM | POA: Insufficient documentation

## 2011-07-30 DIAGNOSIS — Z7982 Long term (current) use of aspirin: Secondary | ICD-10-CM | POA: Insufficient documentation

## 2011-07-30 DIAGNOSIS — E785 Hyperlipidemia, unspecified: Secondary | ICD-10-CM | POA: Insufficient documentation

## 2011-07-30 DIAGNOSIS — R059 Cough, unspecified: Secondary | ICD-10-CM | POA: Insufficient documentation

## 2011-07-30 DIAGNOSIS — J4489 Other specified chronic obstructive pulmonary disease: Secondary | ICD-10-CM | POA: Insufficient documentation

## 2011-07-30 DIAGNOSIS — R05 Cough: Secondary | ICD-10-CM

## 2011-07-30 DIAGNOSIS — J40 Bronchitis, not specified as acute or chronic: Secondary | ICD-10-CM

## 2011-07-30 DIAGNOSIS — J449 Chronic obstructive pulmonary disease, unspecified: Secondary | ICD-10-CM | POA: Insufficient documentation

## 2011-07-30 LAB — DIFFERENTIAL
Basophils Absolute: 0.1 10*3/uL (ref 0.0–0.1)
Basophils Relative: 1 % (ref 0–1)
Eosinophils Absolute: 0.1 10*3/uL (ref 0.0–0.7)
Eosinophils Relative: 1 % (ref 0–5)
Lymphocytes Relative: 16 % (ref 12–46)
Lymphs Abs: 0.9 10*3/uL (ref 0.7–4.0)
Monocytes Absolute: 1.1 10*3/uL — ABNORMAL HIGH (ref 0.1–1.0)
Monocytes Relative: 21 % — ABNORMAL HIGH (ref 3–12)
Neutro Abs: 3.3 10*3/uL (ref 1.7–7.7)
Neutrophils Relative %: 61 % (ref 43–77)

## 2011-07-30 LAB — CBC
HCT: 27.7 % — ABNORMAL LOW (ref 39.0–52.0)
Hemoglobin: 8.5 g/dL — ABNORMAL LOW (ref 13.0–17.0)
MCH: 23.5 pg — ABNORMAL LOW (ref 26.0–34.0)
MCHC: 30.7 g/dL (ref 30.0–36.0)
MCV: 76.7 fL — ABNORMAL LOW (ref 78.0–100.0)
Platelets: 219 10*3/uL (ref 150–400)
RBC: 3.61 MIL/uL — ABNORMAL LOW (ref 4.22–5.81)
RDW: 16.1 % — ABNORMAL HIGH (ref 11.5–15.5)
WBC: 5.4 10*3/uL (ref 4.0–10.5)

## 2011-07-30 LAB — BASIC METABOLIC PANEL
BUN: 8 mg/dL (ref 6–23)
CO2: 27 mEq/L (ref 19–32)
Calcium: 9.3 mg/dL (ref 8.4–10.5)
Chloride: 93 mEq/L — ABNORMAL LOW (ref 96–112)
Creatinine, Ser: 0.58 mg/dL (ref 0.50–1.35)
GFR calc Af Amer: >90 mL/min (ref >90)
GFR calc non Af Amer: >90 mL/min (ref >90)
Glucose, Bld: 98 mg/dL (ref 70–99)
Potassium: 4.1 mEq/L (ref 3.5–5.1)
Sodium: 129 mEq/L — ABNORMAL LOW (ref 135–145)

## 2011-07-30 MED ORDER — IPRATROPIUM BROMIDE 0.02 % IN SOLN
0.5000 mg | Freq: Once | RESPIRATORY_TRACT | Status: AC
  Administered 2011-07-30: 0.5 mg via RESPIRATORY_TRACT
  Filled 2011-07-30: qty 2.5

## 2011-07-30 MED ORDER — ALBUTEROL SULFATE (5 MG/ML) 0.5% IN NEBU
5.0000 mg | INHALATION_SOLUTION | Freq: Once | RESPIRATORY_TRACT | Status: AC
  Administered 2011-07-30: 5 mg via RESPIRATORY_TRACT
  Filled 2011-07-30: qty 1

## 2011-07-30 MED ORDER — SODIUM CHLORIDE 0.9 % IV BOLUS (SEPSIS)
1000.0000 mL | Freq: Once | INTRAVENOUS | Status: AC
  Administered 2011-07-30: 1000 mL via INTRAVENOUS

## 2011-07-30 MED ORDER — PROMETHAZINE-DM 6.25-15 MG/5ML PO SYRP: 5.0000 mL | ORAL_SOLUTION | Freq: Four times a day (QID) | ORAL | Status: AC | PRN

## 2011-07-30 MED ORDER — PREDNISONE 50 MG PO TABS: 50.0000 mg | ORAL_TABLET | Freq: Every day | ORAL | Status: AC

## 2011-07-30 MED ORDER — GUAIFENESIN ER 1200 MG PO TB12: 1.0000 | ORAL_TABLET | Freq: Two times a day (BID) | ORAL | Status: DC

## 2011-07-30 NOTE — Telephone Encounter (Signed)
Pt's wife called. He is having pain in his legs and knees. Is also unable to sleep at night. Reviewed with Dr Myna Hidalgo. This is most likely related to the Prednisone. To try Ultram for pain and Lorazepam for sleep. Reasoning for side effects and directions for use of medication given to wife with understanding. Will send to pharmacy on file.

## 2011-07-30 NOTE — ED Notes (Signed)
Thayer Ohm wants to ambulate pt to see how he will do on pulse ox.  Went into room.  Pt still has fluids to go in and wants to eat more of lunch before ambulation.

## 2011-07-30 NOTE — ED Provider Notes (Signed)
History     CSN: [id number]  Arrival date & time 07/30/11  1035   First MD Initiated Contact with Patient 07/30/11 1044      Chief Complaint  Patient presents with  . Cough    HPI Patient presents emergency Dept. with three-day history of cough.  The wife states, that he has had pneumonia  in the past.  Patient states that he has not had any fevers, chest pain, shortness of breath abdominal pain, nausea/vomiting, back pain, or weakness.  Patient states, that he has not tried any medications for his cough.  Patient was sent here by his primary care doctor.  Patient, states she is coughing up clear mucus. Past Medical History  Diagnosis Date  . Lymphadenopathy   . Hyperlipidemia   . COPD (chronic obstructive pulmonary disease)   . Chills   . Low grade fever   . Unintentional weight loss   . Nasal congestion   . Hearing loss   . Weakness   . Substance abuse     beer consumption - 6 per day.  . Cancer     lymphoma  . 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 Back surgery 2000    Family History  Problem Relation Age of Onset  . Cancer Mother     stomach  . Stroke Father     History  Substance Use Topics  . Smoking status: Former Smoker    Quit date: 07/04/2001  . Smokeless tobacco: Never Used  . Alcohol Use: Yes     6 beers per day.      Review of Systems All pertinent positives and negatives reviewed in the history of present illness  Allergies  Review of patient's allergies indicates no known allergies.  Home Medications   Current Outpatient Rx  Name Route Sig Dispense Refill  . ASPIRIN 81 MG PO TABS Oral Take 81 mg by mouth daily.    . OMEGA-3 FATTY ACIDS 1000 MG PO CAPS Oral Take 360 mg by mouth 2 (two) times daily.    Marland Kitchen LORAZEPAM 0.5 MG PO TABS  1-2 tabs PO/SL every 8 hours as needed for anxiety, sleep, or nausea. 30 tablet 0  . ONDANSETRON HCL 8 MG PO TABS Oral Take 1 tablet (8 mg total) by mouth every 8  (eight) hours as needed for nausea. 20 tablet 3  . PRAVASTATIN SODIUM 10 MG PO TABS Oral Take 10 mg by mouth daily.    Marland Kitchen TARON-C DHA 53.5-38-1 MG PO CAPS  TAKE 1 CAPSULE BY MOUTH DAILY 30 capsule 11  . PRESCRIPTION MEDICATION  [facility]11 pts on every 3 week cycle. Next treatment 08-08-11 followed by Dr Myna Hidalgo    . PROAIR HFA 108 (90 BASE) MCG/ACT IN AERS  as needed.    Marland Kitchen TRAMADOL HCL 50 MG PO TABS  1 - 2 tabs every 6 ours as needed 60 tablet 1  . PROMETHAZINE HCL 12.5 MG PO TABS Oral Take 1 tablet (12.5 mg total) by mouth every 6 (six) hours as needed for nausea. 30 tablet 3    BP 98/61  Pulse 89  Temp(Src) 98.5 F (36.9 C) (Oral)  Resp 19  SpO2 97%  Physical Exam  Constitutional: He is oriented to person, place, and time. He appears well-developed and well-nourished. No distress.  HENT:  Head: Normocephalic and atraumatic. No trismus in the jaw.  Right Ear: Tympanic membrane normal.  Left Ear: Tympanic membrane  normal.  Nose: Nose normal.  Mouth/Throat: Uvula is midline, oropharynx is clear and moist and mucous membranes are normal. No uvula swelling. No oropharyngeal exudate.  Eyes: Pupils are equal, round, and reactive to light.  Neck: Normal range of motion. Neck supple.  Pulmonary/Chest: Effort normal and breath sounds normal. No respiratory distress. He has no wheezes. He has no rales.       Patient has some mild rhonchi noted bilaterally.  Neurological: He is alert and oriented to person, place, and time.  Skin: Skin is warm and dry. No rash noted.    ED Course  Procedures (including critical care time)  Labs Reviewed  CBC - Abnormal; Notable for the following:    RBC 3.61 (*)    Hemoglobin 8.5 (*)    HCT 27.7 (*)    MCV 76.7 (*)    MCH 23.5 (*)    RDW 16.1 (*)    All other components within normal limits  DIFFERENTIAL - Abnormal; Notable for the following:    Monocytes Relative 21 (*)    Monocytes Absolute 1.1 (*)    All other components  within normal limits  BASIC METABOLIC PANEL - Abnormal; Notable for the following:    Sodium 129 (*)    Chloride 93 (*)    All other components within normal limits   Dg Chest 2 View  07/30/2011  *RADIOLOGY REPORT*  Clinical Data: Cough.  CHEST - 2 VIEW  Comparison: 05/17/2011  Findings: There is hyperinflation of the lungs compatible with COPD.  Chronic peribronchial thickening. Heart and mediastinal contours are within normal limits.  No focal opacities or effusions.  No acute bony abnormality.  IMPRESSION: COPD/chronic bronchitis.  Original Report Authenticated By: Cyndie Chime, M.D.    The patient is have any signs of pneumonia on chest x-ray.  Patient maintained his oxygen saturations in the upper 90s, as well as been here in the emergency room.  I have asked the nurse to ambulate the patient with the pulse oximetry.  He was given IV fluids as well.  Patient ambulated without any difficulty.  He is told to follow up with his primary care doctor for a recheck.  Advised him to return here for any worsening in his condition.  Patient ambulated with no issues and pulse ox remained stable.  MDM  MDM Reviewed: nursing note and vitals Interpretation: labs and x-ray            Carlyle Dolly, PA-C 07/30/11 2052

## 2011-07-30 NOTE — ED Notes (Signed)
Pt was fine ambulating in hallway.  States he would like to go home

## 2011-07-30 NOTE — Telephone Encounter (Signed)
Pt's wife called with concerns about [name] [name] breathing and "wet cough" Stated that "he sounds like his lungs are full of fluid". Asked if he was breathing heavy and she confirmed that he was especially when he coughs. He had a temp of 99.6 last night. She said she wanted to take him to the ER then. [name] [name] said he has "bad lungs...he gets pneumonia at least twice a year". Placed her on hold and reviewed with Dr Myna Hidalgo. He advised evaluation in the ED. [name] [name] said she was going to take him to [facility] ER. Instructed her to call EMS if she didn't feel safe driving him or if his condition deteriorated. She verbalized understanding but stated that she has been through this before and would be able to drive him.

## 2011-07-30 NOTE — Discharge Instructions (Signed)
Return here as needed. Follow up with your PCP for a recheck. Increase your fluid intake as well.

## 2011-07-30 NOTE — ED Notes (Signed)
Pt complains of productive cough for past 2-3 days.  Pt's wife states that pt has had pneumonia before but this time it is different.  Pt able to speak full sentence, no respiratory distress noted.  Pt states that he has SOB went moving around.  Pt on chemo for lymphoma.  Last chemo treatment 5 days ago, next treatment on 25th.

## 2011-07-31 NOTE — ED Provider Notes (Signed)
Medical screening examination/treatment/procedure(s) were performed by non-physician practitioner and as supervising physician I was immediately available for consultation/collaboration.  Raeford Razor, MD 07/31/11 [phone]

## 2011-08-08 ENCOUNTER — Other Ambulatory Visit (HOSPITAL_BASED_OUTPATIENT_CLINIC_OR_DEPARTMENT_OTHER): Payer: Medicare Other | Admitting: Lab

## 2011-08-08 ENCOUNTER — Ambulatory Visit (HOSPITAL_BASED_OUTPATIENT_CLINIC_OR_DEPARTMENT_OTHER): Payer: Medicare Other

## 2011-08-08 ENCOUNTER — Ambulatory Visit (HOSPITAL_BASED_OUTPATIENT_CLINIC_OR_DEPARTMENT_OTHER): Payer: Medicare Other | Admitting: Hematology & Oncology

## 2011-08-08 VITALS — BP 93/56 | HR 89 | Temp 97.0°F

## 2011-08-08 VITALS — BP 104/64 | HR 109 | Temp 96.8°F | Ht 68.0 in | Wt 145.0 lb

## 2011-08-08 DIAGNOSIS — C880 Waldenstrom macroglobulinemia: Secondary | ICD-10-CM

## 2011-08-08 DIAGNOSIS — C8589 Other specified types of non-Hodgkin lymphoma, extranodal and solid organ sites: Secondary | ICD-10-CM

## 2011-08-08 DIAGNOSIS — Z5111 Encounter for antineoplastic chemotherapy: Secondary | ICD-10-CM

## 2011-08-08 DIAGNOSIS — F1020 Alcohol dependence, uncomplicated: Secondary | ICD-10-CM

## 2011-08-08 DIAGNOSIS — R110 Nausea: Secondary | ICD-10-CM

## 2011-08-08 LAB — CBC WITH DIFFERENTIAL (CANCER CENTER ONLY)
BASO%: 0.4 % (ref 0.0–2.0)
Eosinophils Absolute: 0.1 10*3/uL (ref 0.0–0.5)
MONO#: 1.8 10*3/uL — ABNORMAL HIGH (ref 0.1–0.9)
MONO%: 20.4 % — ABNORMAL HIGH (ref 0.0–13.0)
NEUT#: 5.2 10*3/uL (ref 1.5–6.5)
Platelets: 260 10*3/uL (ref 145–400)
RBC: 4.05 10*6/uL — ABNORMAL LOW (ref 4.20–5.70)
WBC: 9 10*3/uL (ref 4.0–10.0)

## 2011-08-08 MED ORDER — ALBUTEROL SULFATE (2.5 MG/3ML) 0.083% IN NEBU
2.5000 mg | INHALATION_SOLUTION | Freq: Once | RESPIRATORY_TRACT | Status: AC
  Administered 2011-08-08: 2.5 mg via RESPIRATORY_TRACT
  Filled 2011-08-08: qty 3

## 2011-08-08 MED ORDER — SODIUM CHLORIDE 0.9 % IV SOLN
Freq: Once | INTRAVENOUS | Status: AC
  Administered 2011-08-08: 10:00:00 via INTRAVENOUS

## 2011-08-08 MED ORDER — SODIUM CHLORIDE 0.9 % IV SOLN
375.0000 mg/m2 | Freq: Once | INTRAVENOUS | Status: AC
  Administered 2011-08-08: 700 mg via INTRAVENOUS
  Filled 2011-08-08: qty 70

## 2011-08-08 MED ORDER — IPRATROPIUM-ALBUTEROL 0.5-2.5 (3) MG/3ML IN SOLN: 3.0000 mL | RESPIRATORY_TRACT | Status: DC

## 2011-08-08 MED ORDER — ONDANSETRON 16 MG/50ML IVPB (CHCC)
16.0000 mg | Freq: Once | INTRAVENOUS | Status: AC
  Administered 2011-08-08: 16 mg via INTRAVENOUS

## 2011-08-08 MED ORDER — LORAZEPAM 2 MG/ML IJ SOLN
0.5000 mg | Freq: Once | INTRAMUSCULAR | Status: AC
  Administered 2011-08-08: 13:00:00 via INTRAVENOUS

## 2011-08-08 MED ORDER — ACETAMINOPHEN 325 MG PO TABS
650.0000 mg | ORAL_TABLET | Freq: Once | ORAL | Status: AC
  Administered 2011-08-08: 650 mg via ORAL

## 2011-08-08 MED ORDER — SODIUM CHLORIDE 0.9 % IV SOLN
600.0000 mg/m2 | Freq: Once | INTRAVENOUS | Status: AC
  Administered 2011-08-08: 1060 mg via INTRAVENOUS
  Filled 2011-08-08: qty 53

## 2011-08-08 MED ORDER — VINCRISTINE SULFATE CHEMO INJECTION 1 MG/ML
1.5000 mg | Freq: Once | INTRAVENOUS | Status: AC
  Administered 2011-08-08: 1.5 mg via INTRAVENOUS
  Filled 2011-08-08: qty 1.5

## 2011-08-08 MED ORDER — IPRATROPIUM BROMIDE 0.02 % IN SOLN
0.5000 mg | Freq: Once | RESPIRATORY_TRACT | Status: AC
  Administered 2011-08-08: 0.5 mg via RESPIRATORY_TRACT
  Filled 2011-08-08: qty 2.5

## 2011-08-08 MED ORDER — DEXAMETHASONE SODIUM PHOSPHATE 4 MG/ML IJ SOLN
20.0000 mg | Freq: Once | INTRAMUSCULAR | Status: AC
  Administered 2011-08-08: 20 mg via INTRAVENOUS

## 2011-08-08 MED ORDER — DIPHENHYDRAMINE HCL 25 MG PO CAPS
50.0000 mg | ORAL_CAPSULE | Freq: Once | ORAL | Status: AC
  Administered 2011-08-08: 25 mg via ORAL

## 2011-08-08 NOTE — Patient Instructions (Signed)
[facility] Discharge Instructions for Patients Receiving Chemotherapy  Today you received the following chemotherapy agents Rituxan, Vincristine, Cytoxan To help prevent nausea and vomiting after your treatment, we encourage you to take your nausea medication    If you develop nausea and vomiting that is not controlled by your nausea medication, call the clinic. If it is after clinic hours your family physician or the after hours number for the clinic or go to the Emergency Department.   BELOW ARE SYMPTOMS THAT SHOULD BE REPORTED IMMEDIATELY:  *FEVER GREATER THAN 100.5 F  *CHILLS WITH OR WITHOUT FEVER  NAUSEA AND VOMITING THAT IS NOT CONTROLLED WITH YOUR NAUSEA MEDICATION  *UNUSUAL SHORTNESS OF BREATH  *UNUSUAL BRUISING OR BLEEDING  TENDERNESS IN MOUTH AND THROAT WITH OR WITHOUT PRESENCE OF ULCERS  *URINARY PROBLEMS  *BOWEL PROBLEMS  UNUSUAL RASH Items with * indicate a potential emergency and should be followed up as soon as possible.  One of the nurses will contact you 24 hours after your treatment. Please let the nurse know about any problems that you may have experienced. Feel free to call the clinic you have any questions or concerns. The clinic phone number is [phone].   I have been informed and understand all the instructions given to me. I know to contact the clinic, my physician, or go to the Emergency Department if any problems should occur. I do not have any questions at this time, but understand that I may call the clinic during office hours   should I have any questions or need assistance in obtaining follow up care.    __________________________________________  _____________  __________ Signature of Patient or Authorized Representative            Date                   Time    __________________________________________ Nurse's Signature    

## 2011-08-08 NOTE — Progress Notes (Signed)
This office note has been dictated.

## 2011-08-09 NOTE — Progress Notes (Signed)
CC:   Elvina Sidle, M.D.  DIAGNOSIS:  Lymphoplasmacytic lymphoma.  CURRENT THERAPY:  Status post cycle 1 of R-CHOP.  INTERVAL HISTORY:  [name] [name] comes in for followup.  He had his first cycle of chemotherapy 3 weeks ago.  He actually did fairly well.  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  We made the diagnosis via a bone marrow biopsy.  He, unfortunately, is still drinking quite a bit according to his ex- wife.  He tells me that he is not drinking.  He has had no nausea or vomiting. He did go to the emergency room because of "dehydration."  He had "congestion."  He does have COPD.  He is not smoking.  He had a chest x-ray, which did not show any pneumonia.  He has had no diarrhea.  His appetite comes and goes.  Again, from what his wife says, he is drinking beer more than doing anything else.  PHYSICAL EXAMINATION:  This is a thin, white gentleman in no obvious distress.  Vital signs: 96.8, pulse 109, respiratory rate 18, blood pressure 104/63.  Weight is 145.  Head and neck:  Normocephalic, atraumatic skull.  There are no ocular or oral lesions.  There are no palpable cervical or supraclavicular lymph nodes.  Lungs:  Clear bilaterally.  Cardiac: Regular rate and rhythm with a normal S1 and S2. There are no murmurs, rubs or bruits.  Abdomen:  Soft with good bowel sounds.  There is no palpable abdominal mass.  There is no palpable hepatosplenomegaly. Back: No tenderness of the spine, ribs, or hips. Extremities:  No clubbing, cyanosis or edema.  LABORATORY STUDIES:  White cell count is 9, hemoglobin 9.7, hematocrit 31.5, platelet count 260.  MCV is 78.  IMPRESSION:  Mr. Caraway is a 71 year old gentleman with a lymphoplasmacytic lymphoma.  Again, diagnosed with a bone marrow biopsy.  I talked with Mr. Coker at length about alcohol use.  I really told him that alcohol and chemotherapy do not myocardial infarction.  I told that if he continues to drink that his liver will have  issues.  For now, will go ahead and plan for his 2nd cycle of chemo.  We will be checking his monoclonal studies.   ______________________________ Josph Macho, M.D. PRE/MEDQ  D:  08/08/2011  T:  08/09/2011  Job:  [id number]

## 2011-08-13 ENCOUNTER — Encounter: Payer: Self-pay | Admitting: Gastroenterology

## 2011-08-14 ENCOUNTER — Telehealth: Payer: Self-pay | Admitting: *Deleted

## 2011-08-14 NOTE — Telephone Encounter (Signed)
Pt's wife called requesting a refill of his cough medicine that he was given in the ER as he has continued to have a productive cough of clear/spit like fluid. He is having trouble sleeping at night because of it. Denied any fevers or discoloration of phlegm. She stated that "he sounds really congested and wet in his lungs". Reviewed with Dr Myna Hidalgo. Pt to be evaluated in the ER as this could be a sign of CHF. Mrs Morrish said that he didn't want to go because he felt to bad. Explained that this is when he needs to go. She is going to try to get him to the ER but in the meantime knows Dr Myna Hidalgo is not going to renew is cough medication until he knows what the underlying problem is. She verbalized understanding.

## 2011-08-16 ENCOUNTER — Other Ambulatory Visit: Payer: Self-pay | Admitting: Hematology & Oncology

## 2011-08-29 ENCOUNTER — Encounter: Payer: Self-pay | Admitting: Hematology & Oncology

## 2011-08-29 ENCOUNTER — Ambulatory Visit (HOSPITAL_BASED_OUTPATIENT_CLINIC_OR_DEPARTMENT_OTHER): Payer: Medicare Other

## 2011-08-29 ENCOUNTER — Other Ambulatory Visit: Payer: Self-pay | Admitting: Lab

## 2011-08-29 ENCOUNTER — Other Ambulatory Visit (HOSPITAL_BASED_OUTPATIENT_CLINIC_OR_DEPARTMENT_OTHER): Payer: Medicare Other | Admitting: Lab

## 2011-08-29 ENCOUNTER — Ambulatory Visit (HOSPITAL_BASED_OUTPATIENT_CLINIC_OR_DEPARTMENT_OTHER): Payer: Medicare Other | Admitting: Hematology & Oncology

## 2011-08-29 VITALS — BP 86/50 | HR 84 | Temp 97.1°F

## 2011-08-29 VITALS — BP 99/61 | HR 106 | Temp 97.1°F | Ht 68.0 in | Wt 145.0 lb

## 2011-08-29 DIAGNOSIS — J449 Chronic obstructive pulmonary disease, unspecified: Secondary | ICD-10-CM

## 2011-08-29 DIAGNOSIS — Z5111 Encounter for antineoplastic chemotherapy: Secondary | ICD-10-CM

## 2011-08-29 DIAGNOSIS — C8800 Waldenstrom macroglobulinemia not having achieved remission: Secondary | ICD-10-CM

## 2011-08-29 DIAGNOSIS — D649 Anemia, unspecified: Secondary | ICD-10-CM

## 2011-08-29 DIAGNOSIS — D6481 Anemia due to antineoplastic chemotherapy: Secondary | ICD-10-CM

## 2011-08-29 DIAGNOSIS — T451X5A Adverse effect of antineoplastic and immunosuppressive drugs, initial encounter: Secondary | ICD-10-CM

## 2011-08-29 DIAGNOSIS — C8589 Other specified types of non-Hodgkin lymphoma, extranodal and solid organ sites: Secondary | ICD-10-CM

## 2011-08-29 LAB — CBC WITH DIFFERENTIAL (CANCER CENTER ONLY)
Eosinophils Absolute: 0.1 10*3/uL (ref 0.0–0.5)
HCT: 30.6 % — ABNORMAL LOW (ref 38.7–49.9)
LYMPH#: 1.5 10*3/uL (ref 0.9–3.3)
LYMPH%: 24.3 % (ref 14.0–48.0)
MCV: 79 fL — ABNORMAL LOW (ref 82–98)
MONO#: 1.4 10*3/uL — ABNORMAL HIGH (ref 0.1–0.9)
Platelets: 251 10*3/uL (ref 145–400)
RBC: 3.89 10*6/uL — ABNORMAL LOW (ref 4.20–5.70)
WBC: 6 10*3/uL (ref 4.0–10.0)

## 2011-08-29 MED ORDER — ONDANSETRON 16 MG/50ML IVPB (CHCC)
16.0000 mg | Freq: Once | INTRAVENOUS | Status: AC
  Administered 2011-08-29: 16 mg via INTRAVENOUS

## 2011-08-29 MED ORDER — SODIUM CHLORIDE 0.9 % IV SOLN
375.0000 mg/m2 | Freq: Once | INTRAVENOUS | Status: AC
  Administered 2011-08-29: 700 mg via INTRAVENOUS
  Filled 2011-08-29: qty 70

## 2011-08-29 MED ORDER — SODIUM CHLORIDE 0.9 % IV SOLN
700.0000 mg/m2 | Freq: Once | INTRAVENOUS | Status: AC
  Administered 2011-08-29: 1220 mg via INTRAVENOUS
  Filled 2011-08-29: qty 61

## 2011-08-29 MED ORDER — SODIUM CHLORIDE 0.9 % IJ SOLN
10.0000 mL | INTRAMUSCULAR | Status: DC | PRN
  Filled 2011-08-29: qty 10

## 2011-08-29 MED ORDER — DEXAMETHASONE SODIUM PHOSPHATE 4 MG/ML IJ SOLN
20.0000 mg | Freq: Once | INTRAMUSCULAR | Status: AC
  Administered 2011-08-29: 20 mg via INTRAVENOUS

## 2011-08-29 MED ORDER — HEPARIN SOD (PORK) LOCK FLUSH 100 UNIT/ML IV SOLN
500.0000 [IU] | Freq: Once | INTRAVENOUS | Status: DC | PRN
  Filled 2011-08-29: qty 5

## 2011-08-29 MED ORDER — DIPHENHYDRAMINE HCL 25 MG PO CAPS
50.0000 mg | ORAL_CAPSULE | Freq: Once | ORAL | Status: AC
  Administered 2011-08-29: 50 mg via ORAL

## 2011-08-29 MED ORDER — LORAZEPAM 0.5 MG PO TABS: ORAL_TABLET | ORAL | Status: DC

## 2011-08-29 MED ORDER — SODIUM CHLORIDE 0.9 % IV SOLN
Freq: Once | INTRAVENOUS | Status: AC
  Administered 2011-08-29: 10:00:00 via INTRAVENOUS

## 2011-08-29 MED ORDER — DARBEPOETIN ALFA-POLYSORBATE 300 MCG/0.6ML IJ SOLN
300.0000 ug | Freq: Once | INTRAMUSCULAR | Status: AC
  Administered 2011-08-29: 300 ug via SUBCUTANEOUS

## 2011-08-29 MED ORDER — ACETAMINOPHEN 325 MG PO TABS
650.0000 mg | ORAL_TABLET | Freq: Once | ORAL | Status: AC
  Administered 2011-08-29: 650 mg via ORAL

## 2011-08-29 MED ORDER — VINCRISTINE SULFATE CHEMO INJECTION 1 MG/ML
1.5000 mg | Freq: Once | INTRAVENOUS | Status: AC
  Administered 2011-08-29: 1.5 mg via INTRAVENOUS
  Filled 2011-08-29: qty 1.5

## 2011-08-29 NOTE — Progress Notes (Signed)
Addended by: Arlan Organ R on: 08/29/2011 09:25 AM   Modules accepted: Orders

## 2011-08-29 NOTE — Patient Instructions (Signed)
Darbepoetin Alfa injection What is this medicine? DARBEPOETIN ALFA (dar be POE e tin AL fa) helps your body make more red blood cells. It is used to treat anemia caused by chronic kidney failure and chemotherapy. This medicine may be used for other purposes; ask your health care provider or pharmacist if you have questions. What should I tell my health care provider before I take this medicine? They need to know if you have any of these conditions: -blood clotting disorders or history of blood clots -cancer patient not on chemotherapy -cystic fibrosis -heart disease, such as angina, heart failure, or a history of a heart attack -hemoglobin level of 12 g/dL or greater -high blood pressure -low levels of folate, iron, or vitamin B12 -seizures -an unusual or allergic reaction to darbepoetin, erythropoietin, albumin, hamster proteins, latex, other medicines, foods, dyes, or preservatives -pregnant or trying to get pregnant -breast-feeding How should I use this medicine? This medicine is for injection into a vein or under the skin. It is usually given by a health care professional in a hospital or clinic setting. If you get this medicine at home, you will be taught how to prepare and give this medicine. Do not shake the solution before you withdraw a dose. Use exactly as directed. Take your medicine at regular intervals. Do not take your medicine more often than directed. It is important that you put your used needles and syringes in a special sharps container. Do not put them in a trash can. If you do not have a sharps container, call your pharmacist or healthcare provider to get one. Talk to your pediatrician regarding the use of this medicine in children. While this medicine may be used in children as young as 1 year for selected conditions, precautions do apply. Overdosage: If you think you have taken too much of this medicine contact a poison control center or emergency room at once. NOTE:  This medicine is only for you. Do not share this medicine with others. What if I miss a dose? If you miss a dose, take it as soon as you can. If it is almost time for your next dose, take only that dose. Do not take double or extra doses. What may interact with this medicine? Do not take this medicine with any of the following medications: -epoetin alfa This list may not describe all possible interactions. Give your health care provider a list of all the medicines, herbs, non-prescription drugs, or dietary supplements you use. Also tell them if you smoke, drink alcohol, or use illegal drugs. Some items may interact with your medicine. What should I watch for while using this medicine? Visit your prescriber or health care professional for regular checks on your progress and for the needed blood tests and blood pressure measurements. It is especially important for the doctor to make sure your hemoglobin level is in the desired range, to limit the risk of potential side effects and to give you the best benefit. Keep all appointments for any recommended tests. Check your blood pressure as directed. Ask your doctor what your blood pressure should be and when you should contact him or her. As your body makes more red blood cells, you may need to take iron, folic acid, or vitamin B supplements. Ask your doctor or health care provider which products are right for you. If you have kidney disease continue dietary restrictions, even though this medication can make you feel better. Talk with your doctor or health care professional about the   foods you eat and the vitamins that you take. What side effects may I notice from receiving this medicine? Side effects that you should report to your doctor or health care professional as soon as possible: -allergic reactions like skin rash, itching or hives, swelling of the face, lips, or tongue -breathing problems -changes in vision -chest pain -confusion, trouble speaking  or understanding -feeling faint or lightheaded, falls -high blood pressure -muscle aches or pains -pain, swelling, warmth in the leg -rapid weight gain -severe headaches -sudden numbness or weakness of the face, arm or leg -trouble walking, dizziness, loss of balance or coordination -seizures (convulsions) -swelling of the ankles, feet, hands -unusually weak or tired Side effects that usually do not require medical attention (report to your doctor or health care professional if they continue or are bothersome): -diarrhea -fever, chills (flu-like symptoms) -headaches -nausea, vomiting -redness, stinging, or swelling at site where injected This list may not describe all possible side effects. Call your doctor for medical advice about side effects. You may report side effects to FDA at 1-800-FDA-1088. Where should I keep my medicine? Keep out of the reach of children. Store in a refrigerator between 2 and 8 degrees C (36 and 46 degrees F). Do not freeze. Do not shake. Throw away any unused portion if using a single-dose vial. Throw away any unused medicine after the expiration date. NOTE: This sheet is a summary. It may not cover all possible information. If you have questions about this medicine, talk to your doctor, pharmacist, or health care provider.  2012, [employer]. (03/15/2008 10:23:57 AM)Rituximab injection What is this medicine? RITUXIMAB (ri TUX i mab) is a monoclonal antibody. This medicine changes the way the body's immune system works. It is used commonly to treat non-Hodgkin's lymphoma and other conditions. In cancer cells, this drug targets a specific protein within cancer cells and stops the cancer cells from growing. It is also used to treat rhuematoid arthritis (RA). In RA, this medicine slow the inflammatory process and help reduce joint pain and swelling. This medicine is often used with other cancer or arthritis medications. This medicine may be used for other  purposes; ask your health care provider or pharmacist if you have questions. What should I tell my health care provider before I take this medicine? They need to know if you have any of these conditions: -blood disorders -heart disease -history of hepatitis B -infection (especially a virus infection such as chickenpox, cold sores, or herpes) -irregular heartbeat -kidney disease -lung or breathing disease, like asthma -lupus -an unusual or allergic reaction to rituximab, mouse proteins, other medicines, foods, dyes, or preservatives -pregnant or trying to get pregnant -breast-feeding How should I use this medicine? This medicine is for infusion into a vein. It is administered in a hospital or clinic by a specially trained health care professional. A special MedGuide will be given to you by the pharmacist with each prescription and refill. Be sure to read this information carefully each time. Talk to your pediatrician regarding the use of this medicine in children. This medicine is not approved for use in children. Overdosage: If you think you have taken too much of this medicine contact a poison control center or emergency room at once. NOTE: This medicine is only for you. Do not share this medicine with others. What if I miss a dose? It is important not to miss a dose. Call your doctor or health care professional if you are unable to keep an appointment. What may  interact with this medicine? -cisplatin -medicines for blood pressure -some other medicines for arthritis -vaccines This list may not describe all possible interactions. Give your health care provider a list of all the medicines, herbs, non-prescription drugs, or dietary supplements you use. Also tell them if you smoke, drink alcohol, or use illegal drugs. Some items may interact with your medicine. What should I watch for while using this medicine? Report any side effects that you notice during your treatment right away, such  as changes in your breathing, fever, chills, dizziness or lightheadedness. These effects are more common with the first dose. Visit your prescriber or health care professional for checks on your progress. You will need to have regular blood work. Report any other side effects. The side effects of this medicine can continue after you finish your treatment. Continue your course of treatment even though you feel ill unless your doctor tells you to stop. Call your doctor or health care professional for advice if you get a fever, chills or sore throat, or other symptoms of a cold or flu. Do not treat yourself. This drug decreases your body's ability to fight infections. Try to avoid being around people who are sick. This medicine may increase your risk to bruise or bleed. Call your doctor or health care professional if you notice any unusual bleeding. Be careful brushing and flossing your teeth or using a toothpick because you may get an infection or bleed more easily. If you have any dental work done, tell your dentist you are receiving this medicine. Avoid taking products that contain aspirin, acetaminophen, ibuprofen, naproxen, or ketoprofen unless instructed by your doctor. These medicines may hide a fever. Do not become pregnant while taking this medicine. Women should inform their doctor if they wish to become pregnant or think they might be pregnant. There is a potential for serious side effects to an unborn child. Talk to your health care professional or pharmacist for more information. Do not breast-feed an infant while taking this medicine. What side effects may I notice from receiving this medicine? Side effects that you should report to your doctor or health care professional as soon as possible: -allergic reactions like skin rash, itching or hives, swelling of the face, lips, or tongue -low blood counts - this medicine may decrease the number of white blood cells, red blood cells and platelets. You  may be at increased risk for infections and bleeding. -signs of infection - fever or chills, cough, sore throat, pain or difficulty passing urine -signs of decreased platelets or bleeding - bruising, pinpoint red spots on the skin, black, tarry stools, blood in the urine -signs of decreased red blood cells - unusually weak or tired, fainting spells, lightheadedness -breathing problems -confused, not responsive -chest pain -fast, irregular heartbeat -feeling faint or lightheaded, falls -mouth sores -redness, blistering, peeling or loosening of the skin, including inside the mouth -stomach pain -swelling of the ankles, feet, or hands -trouble passing urine or change in the amount of urine Side effects that usually do not require medical attention (report to your doctor or other health care professional if they continue or are bothersome): -anxiety -headache -loss of appetite -muscle aches -nausea -night sweats This list may not describe all possible side effects. Call your doctor for medical advice about side effects. You may report side effects to FDA at 1-800-FDA-1088. Where should I keep my medicine? This drug is given in a hospital or clinic and will not be stored at home. NOTE: This sheet  is a summary. It may not cover all possible information. If you have questions about this medicine, talk to your doctor, pharmacist, or health care provider.  2012, [employer]. (11/30/2007 2:04:59 PM)Cyclophosphamide injection What is this medicine? CYCLOPHOSPHAMIDE (sye kloe FOSS fa mide) is a chemotherapy drug. It slows the growth of cancer cells. This medicine is used to treat many types of cancer like lymphoma, myeloma, leukemia, breast cancer, and ovarian cancer, to name a few. It is also used to treat nephrotic syndrome in children. This medicine may be used for other purposes; ask your health care provider or pharmacist if you have questions. What should I tell my health care  provider before I take this medicine? They need to know if you have any of these conditions: -blood disorders -history of other chemotherapy -history of radiation therapy -infection -kidney disease -liver disease -tumors in the bone marrow -an unusual or allergic reaction to cyclophosphamide, other chemotherapy, other medicines, foods, dyes, or preservatives -pregnant or trying to get pregnant -breast-feeding How should I use this medicine? This drug is usually given as an injection into a vein or muscle or by infusion into a vein. It is administered in a hospital or clinic by a specially trained health care professional. Talk to your pediatrician regarding the use of this medicine in children. While this drug may be prescribed for selected conditions, precautions do apply. Overdosage: If you think you have taken too much of this medicine contact a poison control center or emergency room at once. NOTE: This medicine is only for you. Do not share this medicine with others. What if I miss a dose? It is important not to miss your dose. Call your doctor or health care professional if you are unable to keep an appointment. What may interact with this medicine? Do not take this medicine with any of the following medications: -mibefradil -nalidixic acid This medicine may also interact with the following medications: -doxorubicin -etanercept -medicines to increase blood counts like filgrastim, pegfilgrastim, sargramostim -medicines that block muscle or nerve pain -St. John's Wort -phenobarbital -succinylcholine chloride -trastuzumab -vaccines Talk to your doctor or health care professional before taking any of these medicines: -acetaminophen -aspirin -ibuprofen -ketoprofen -naproxen This list may not describe all possible interactions. Give your health care provider a list of all the medicines, herbs, non-prescription drugs, or dietary supplements you use. Also tell them if you smoke,  drink alcohol, or use illegal drugs. Some items may interact with your medicine. What should I watch for while using this medicine? Visit your doctor for checks on your progress. This drug may make you feel generally unwell. This is not uncommon, as chemotherapy can affect healthy cells as well as cancer cells. Report any side effects. Continue your course of treatment even though you feel ill unless your doctor tells you to stop. Drink water or other fluids as directed. Urinate often, even at night. In some cases, you may be given additional medicines to help with side effects. Follow all directions for their use. Call your doctor or health care professional for advice if you get a fever, chills or sore throat, or other symptoms of a cold or flu. Do not treat yourself. This drug decreases your body's ability to fight infections. Try to avoid being around people who are sick. This medicine may increase your risk to bruise or bleed. Call your doctor or health care professional if you notice any unusual bleeding. Be careful brushing and flossing your teeth or using a toothpick because  you may get an infection or bleed more easily. If you have any dental work done, tell your dentist you are receiving this medicine. Avoid taking products that contain aspirin, acetaminophen, ibuprofen, naproxen, or ketoprofen unless instructed by your doctor. These medicines may hide a fever. Do not become pregnant while taking this medicine. Women should inform their doctor if they wish to become pregnant or think they might be pregnant. There is a potential for serious side effects to an unborn child. Talk to your health care professional or pharmacist for more information. Do not breast-feed an infant while taking this medicine. Men should inform their doctor if they wish to father a child. This medicine may lower sperm counts. If you are going to have surgery, tell your doctor or health care professional that you have  taken this medicine. What side effects may I notice from receiving this medicine? Side effects that you should report to your doctor or health care professional as soon as possible: -allergic reactions like skin rash, itching or hives, swelling of the face, lips, or tongue -low blood counts - this medicine may decrease the number of white blood cells, red blood cells and platelets. You may be at increased risk for infections and bleeding. -signs of infection - fever or chills, cough, sore throat, pain or difficulty passing urine -signs of decreased platelets or bleeding - bruising, pinpoint red spots on the skin, black, tarry stools, blood in the urine -signs of decreased red blood cells - unusually weak or tired, fainting spells, lightheadedness -breathing problems -dark urine -mouth sores -pain, swelling, redness at site where injected -swelling of the ankles, feet, hands -trouble passing urine or change in the amount of urine -weight gain -yellowing of the eyes or skin Side effects that usually do not require medical attention (report to your doctor or health care professional if they continue or are bothersome): -changes in nail or skin color -diarrhea -hair loss -loss of appetite -missed menstrual periods -nausea, vomiting -stomach pain This list may not describe all possible side effects. Call your doctor for medical advice about side effects. You may report side effects to FDA at 1-800-FDA-1088. Where should I keep my medicine? This drug is given in a hospital or clinic and will not be stored at home. NOTE: This sheet is a summary. It may not cover all possible information. If you have questions about this medicine, talk to your doctor, pharmacist, or health care provider.  2012, [employer] Standard. (07/07/2007 2:32:25 PM)Vincristine injection What is this medicine? VINCRISTINE (vin KRIS teen) is a chemotherapy drug. It slows the growth of cancer cells. This medicine is used  to treat many types of cancer like Hodgkin's disease, leukemia, non-Hodgkin's lymphoma, neuroblastoma (brain cancer), rhabdomyosarcoma, and Wilms' tumor. This medicine may be used for other purposes; ask your health care provider or pharmacist if you have questions. What should I tell my health care provider before I take this medicine? They need to know if you have any of these conditions: -blood disorders -gout -infection (especially chickenpox, cold sores, or herpes) -kidney disease -liver disease -lung disease -nervous system disease like Charcot-Marie-Tooth (CMT) -recent or ongoing radiation therapy -an unusual or allergic reaction to vincristine, other chemotherapy agents, other medicines, foods, dyes, or preservatives -pregnant or trying to get pregnant -breast-feeding How should I use this medicine? This drug is given as an infusion into a vein. It is administered in a hospital or clinic by a specially trained health care professional. If you have pain, swelling,  burning, or any unusual feeling around the site of your injection, tell your health care professional right away. Talk to your pediatrician regarding the use of this medicine in children. While this drug may be prescribed for selected conditions, precautions do apply. Overdosage: If you think you have taken too much of this medicine contact a poison control center or emergency room at once. NOTE: This medicine is only for you. Do not share this medicine with others. What if I miss a dose? It is important not to miss your dose. Call your doctor or health care professional if you are unable to keep an appointment. What may interact with this medicine? Do not take this medicine with any of the following medications: -itraconazole -mibefradil -voriconazole This medicine may also interact with the following medications: -cyclosporine -erythromycin -fluconazole -ketoconazole -medicines for HIV like delavirdine, efavirenz,  nevirapine -medicines for seizures like ethotoin, fosphenotoin, phenytoin -medicines to increase blood counts like filgrastim, pegfilgrastim, sargramostim -other chemotherapy drugs like cisplatin, L-asparaginase, methotrexate, mitomycin, paclitaxel -pegaspargase -vaccines -zalcitabine, ddC Talk to your doctor or health care professional before taking any of these medicines: -acetaminophen -aspirin -ibuprofen -ketoprofen -naproxen This list may not describe all possible interactions. Give your health care provider a list of all the medicines, herbs, non-prescription drugs, or dietary supplements you use. Also tell them if you smoke, drink alcohol, or use illegal drugs. Some items may interact with your medicine. What should I watch for while using this medicine? Your condition will be monitored carefully while you are receiving this medicine. You will need important blood work done while you are taking this medicine. This drug may make you feel generally unwell. This is not uncommon, as chemotherapy can affect healthy cells as well as cancer cells. Report any side effects. Continue your course of treatment even though you feel ill unless your doctor tells you to stop. In some cases, you may be given additional medicines to help with side effects. Follow all directions for their use. Call your doctor or health care professional for advice if you get a fever, chills or sore throat, or other symptoms of a cold or flu. Do not treat yourself. Avoid taking products that contain aspirin, acetaminophen, ibuprofen, naproxen, or ketoprofen unless instructed by your doctor. These medicines may hide a fever. Do not become pregnant while taking this medicine. Women should inform their doctor if they wish to become pregnant or think they might be pregnant. There is a potential for serious side effects to an unborn child. Talk to your health care professional or pharmacist for more information. Do not  breast-feed an infant while taking this medicine. Men may have a lower sperm count while taking this medicine. Talk to your doctor if you plan to father a child. What side effects may I notice from receiving this medicine? Side effects that you should report to your doctor or health care professional as soon as possible: -allergic reactions like skin rash, itching or hives, swelling of the face, lips, or tongue -breathing problems -confusion or changes in emotions or moods -constipation -cough -mouth sores -muscle weakness -nausea and vomiting -pain, swelling, redness or irritation at the injection site -pain, tingling, numbness in the hands or feet -problems with balance, talking, walking -seizures -stomach pain -trouble passing urine or change in the amount of urine Side effects that usually do not require medical attention (report to your doctor or health care professional if they continue or are bothersome): -diarrhea -hair loss -jaw pain -loss of appetite This list  may not describe all possible side effects. Call your doctor for medical advice about side effects. You may report side effects to FDA at 1-800-FDA-1088. Where should I keep my medicine? This drug is given in a hospital or clinic and will not be stored at home. NOTE: This sheet is a summary. It may not cover all possible information. If you have questions about this medicine, talk to your doctor, pharmacist, or health care provider.  2012, [employer]. (12/28/2007 5:17:13 PM)

## 2011-08-29 NOTE — Progress Notes (Signed)
This office note has been dictated.

## 2011-08-30 NOTE — Progress Notes (Signed)
CC:   Elvina Sidle, M.D.  DIAGNOSIS:  Lymphoplasmacytic lymphoma.  CURRENT THERAPY:  Patient is status post 2 cycles of R-CVP.  INTERIM HISTORY:  Mr. Reetz comes in for his followup.  I think he is doing okay.  The blood studies show that he is responding.  His monoclonal levels are improving.  His monoclonal spike was 2.18 back in early February.  His kappa light chain was 35 mg/dL.  IgM level was 5620 mg/dL.  We are checking these today.  He has had no fever.  He said he is not drinking any longer.  He said he still weak.  There has been no bleeding.  He has had no bony pain.  He has had a little bit of "congestion."  He sometimes goes to the emergency room because of this suggestion.  His x-rays have never shown any pneumonia.  PHYSICAL EXAMINATION:  This is a thin but well-nourished white gentleman in no obvious distress.  Vital signs:  97.1, pulse 106, respiratory rate 18, blood pressure 99/61.  Weight is 144.  Head and neck: Normocephalic, atraumatic skull.  There are no ocular or oral lesions. There are no palpable cervical or supraclavicular lymph nodes.  Lungs: Clear bilaterally.  He has no rales, wheezes or rhonchi.  Cardiac: Regular rate and rhythm with a normal S1 and S2.  There are no murmurs, rubs or bruits.  Abdomen:  Soft with good bowel sounds.  There is no palpable abdominal mass.  There is no fluid wave.  There is no palpable hepatosplenomegaly.  Extremities:  No clubbing, cyanosis or edema. Neurologic:  No focal neurological deficits.  Skin: No  rashes, ecchymoses or petechia.  LABORATORY STUDIES:  White count is 6, hemoglobin 9.4, hematocrit 30.6, platelet count is 151.  MCV is 79.  IMPRESSION:  Mr. Liew is a 71 year old gentleman with the lymphoplasmacytic lymphoma.  He has had 2 cycles of chemotherapy with R- CVP.  Again, I believe that he is responding.  We will go ahead and give him a dose of Aranesp today because of the anemia.  The anemia is part  and parcel associated with the chemotherapy.  Will go ahead and plan for another cycle in 3 more weeks and then will probably rescan him.    ______________________________ Josph Macho, M.D. PRE/MEDQ  D:  08/29/2011  T:  08/30/2011  Job:  [id number]

## 2011-09-02 LAB — IRON AND TIBC: %SAT: 8 % — ABNORMAL LOW (ref 20–55)

## 2011-09-02 LAB — COMPREHENSIVE METABOLIC PANEL
ALT: <8 U/L (ref 0–53)
Albumin: 2.9 g/dL — ABNORMAL LOW (ref 3.5–5.2)
CO2: 25 mEq/L (ref 19–32)
Calcium: 8.5 mg/dL (ref 8.4–10.5)
Chloride: 97 mEq/L (ref 96–112)
Glucose, Bld: 119 mg/dL — ABNORMAL HIGH (ref 70–99)
Sodium: 133 mEq/L — ABNORMAL LOW (ref 135–145)
Total Bilirubin: 0.4 mg/dL (ref 0.3–1.2)
Total Protein: 6.6 g/dL (ref 6.0–8.3)

## 2011-09-02 LAB — PROTEIN ELECTROPHORESIS, SERUM, WITH REFLEX
Alpha-2-Globulin: 15.2 % — ABNORMAL HIGH (ref 7.1–11.8)
Beta 2: 29.8 % — ABNORMAL HIGH (ref 3.2–6.5)
Beta Globulin: 5.8 % (ref 4.7–7.2)
Gamma Globulin: 3.8 % — ABNORMAL LOW (ref 11.1–18.8)
M-Spike, %: 1.91 g/dL
Total Protein, Serum Electrophoresis: 6.6 g/dL (ref 6.0–8.3)

## 2011-09-02 LAB — IGG, IGA, IGM: IgG (Immunoglobin G), Serum: 272 mg/dL — ABNORMAL LOW (ref 650–1600)

## 2011-09-02 LAB — LACTATE DEHYDROGENASE: LDH: 84 U/L — ABNORMAL LOW (ref 94–250)

## 2011-09-02 LAB — IFE INTERPRETATION

## 2011-09-05 ENCOUNTER — Telehealth: Payer: Self-pay | Admitting: Hematology & Oncology

## 2011-09-05 ENCOUNTER — Other Ambulatory Visit: Payer: Self-pay | Admitting: *Deleted

## 2011-09-05 ENCOUNTER — Telehealth: Payer: Self-pay | Admitting: *Deleted

## 2011-09-05 DIAGNOSIS — D6481 Anemia due to antineoplastic chemotherapy: Secondary | ICD-10-CM

## 2011-09-05 NOTE — Telephone Encounter (Signed)
Pt aware of 5-28 iron

## 2011-09-05 NOTE — Telephone Encounter (Signed)
Message copied by Anselm Jungling on Thu Sep 05, 2011 10:23 AM ------      Message from: Arlan Organ R      Created: Mon Sep 02, 2011  6:17 PM       Call his wife!!! Iron is very low!!!  This is why he is tired!!  He needs Feraheme 1020mg  this week!!!!  Cindee Lame

## 2011-09-05 NOTE — Telephone Encounter (Signed)
Called patients wife to let her know that [name] [name] iron levels were extremely low which is why he is so tired.  He needs a dose of Feraheme 1020 mg this week.  Left this message on patients personal cell phone.  Asked patient to call us bck to schedule

## 2011-09-10 ENCOUNTER — Other Ambulatory Visit: Payer: Self-pay | Admitting: *Deleted

## 2011-09-10 ENCOUNTER — Ambulatory Visit (HOSPITAL_BASED_OUTPATIENT_CLINIC_OR_DEPARTMENT_OTHER): Payer: Medicare Other

## 2011-09-10 VITALS — BP 92/50 | HR 90 | Temp 98.0°F

## 2011-09-10 DIAGNOSIS — D649 Anemia, unspecified: Secondary | ICD-10-CM

## 2011-09-10 DIAGNOSIS — D6481 Anemia due to antineoplastic chemotherapy: Secondary | ICD-10-CM

## 2011-09-10 MED ORDER — SODIUM CHLORIDE 0.9 % IV SOLN
Freq: Once | INTRAVENOUS | Status: AC
  Administered 2011-09-10: 13:00:00 via INTRAVENOUS

## 2011-09-10 MED ORDER — SODIUM CHLORIDE 0.9 % IV SOLN
1020.0000 mg | Freq: Once | INTRAVENOUS | Status: AC
  Administered 2011-09-10: 1020 mg via INTRAVENOUS
  Filled 2011-09-10: qty 34

## 2011-09-10 NOTE — Patient Instructions (Signed)
Ferumoxytol injection What is this medicine? FERUMOXYTOL is an iron complex. Iron is used to make healthy red blood cells, which carry oxygen and nutrients throughout the body. This medicine is used to treat iron deficiency anemia in people with chronic kidney disease. This medicine may be used for other purposes; ask your health care provider or pharmacist if you have questions. What should I tell my health care provider before I take this medicine? They need to know if you have any of these conditions: -anemia not caused by low iron levels -high levels of iron in the blood -magnetic resonance imaging (MRI) test scheduled -an unusual or allergic reaction to iron, other medicines, foods, dyes, or preservatives -pregnant or trying to get pregnant -breast-feeding How should I use this medicine? This medicine is for infusion into a vein. It is given by a health care professional in a hospital or clinic setting. Talk to your pediatrician regarding the use of this medicine in children. Special care may be needed. Overdosage: If you think you've taken too much of this medicine contact a poison control center or emergency room at once. Overdosage: If you think you have taken too much of this medicine contact a poison control center or emergency room at once. NOTE: This medicine is only for you. Do not share this medicine with others. What if I miss a dose? It is important not to miss your dose. Call your doctor or health care professional if you are unable to keep an appointment. What may interact with this medicine? This medicine may interact with the following medications: -other iron products This list may not describe all possible interactions. Give your health care provider a list of all the medicines, herbs, non-prescription drugs, or dietary supplements you use. Also tell them if you smoke, drink alcohol, or use illegal drugs. Some items may interact with your medicine. What should I watch  for while using this medicine? Visit your doctor or healthcare professional regularly. Tell your doctor or healthcare professional if your symptoms do not start to get better or if they get worse. You may need blood work done while you are taking this medicine. You may need to follow a special diet. Talk to your doctor. Foods that contain iron include: whole grains/cereals, dried fruits, beans, or peas, leafy green vegetables, and organ meats (liver, kidney). What side effects may I notice from receiving this medicine? Side effects that you should report to your doctor or health care professional as soon as possible: -allergic reactions like skin rash, itching or hives, swelling of the face, lips, or tongue -breathing problems -changes in blood pressure -feeling faint or lightheaded, falls -fever or chills -flushing, sweating, or hot feelings -swelling of the ankles or feet Side effects that usually do not require medical attention (Report these to your doctor or health care professional if they continue or are bothersome.): -diarrhea -headache -nausea, vomiting -stomach pain This list may not describe all possible side effects. Call your doctor for medical advice about side effects. You may report side effects to FDA at 1-800-FDA-1088. Where should I keep my medicine? This drug is given in a hospital or clinic and will not be stored at home. NOTE: This sheet is a summary. It may not cover all possible information. If you have questions about this medicine, talk to your doctor, pharmacist, or health care provider.  2012, [employer]. (12/23/2007 9:48:25 PM) 

## 2011-09-19 ENCOUNTER — Ambulatory Visit (HOSPITAL_BASED_OUTPATIENT_CLINIC_OR_DEPARTMENT_OTHER): Payer: Medicare Other

## 2011-09-19 ENCOUNTER — Ambulatory Visit (HOSPITAL_BASED_OUTPATIENT_CLINIC_OR_DEPARTMENT_OTHER): Payer: Medicare Other | Admitting: Hematology & Oncology

## 2011-09-19 ENCOUNTER — Other Ambulatory Visit (HOSPITAL_BASED_OUTPATIENT_CLINIC_OR_DEPARTMENT_OTHER): Payer: Medicare Other | Admitting: Lab

## 2011-09-19 VITALS — BP 85/50 | HR 80 | Temp 97.0°F

## 2011-09-19 VITALS — BP 89/56 | HR 97 | Temp 97.1°F | Ht 68.0 in | Wt 145.0 lb

## 2011-09-19 DIAGNOSIS — Z5111 Encounter for antineoplastic chemotherapy: Secondary | ICD-10-CM

## 2011-09-19 DIAGNOSIS — J449 Chronic obstructive pulmonary disease, unspecified: Secondary | ICD-10-CM

## 2011-09-19 DIAGNOSIS — C880 Waldenstrom macroglobulinemia: Secondary | ICD-10-CM

## 2011-09-19 DIAGNOSIS — C8589 Other specified types of non-Hodgkin lymphoma, extranodal and solid organ sites: Secondary | ICD-10-CM

## 2011-09-19 DIAGNOSIS — D649 Anemia, unspecified: Secondary | ICD-10-CM

## 2011-09-19 DIAGNOSIS — Z5112 Encounter for antineoplastic immunotherapy: Secondary | ICD-10-CM

## 2011-09-19 LAB — CBC WITH DIFFERENTIAL (CANCER CENTER ONLY)
BASO#: 0.1 10*3/uL (ref 0.0–0.2)
EOS%: 2.7 % (ref 0.0–7.0)
HGB: 11 g/dL — ABNORMAL LOW (ref 13.0–17.1)
LYMPH#: 1.6 10*3/uL (ref 0.9–3.3)
MCHC: 31.1 g/dL — ABNORMAL LOW (ref 32.0–35.9)
NEUT#: 3.4 10*3/uL (ref 1.5–6.5)
RBC: 4.29 10*6/uL (ref 4.20–5.70)

## 2011-09-19 MED ORDER — SODIUM CHLORIDE 0.9 % IJ SOLN
1.5000 mg | Freq: Once | INTRAMUSCULAR | Status: AC
  Administered 2011-09-19: 1.5 mg via INTRAVENOUS
  Filled 2011-09-19: qty 1.5

## 2011-09-19 MED ORDER — DEXAMETHASONE SODIUM PHOSPHATE 4 MG/ML IJ SOLN
20.0000 mg | Freq: Once | INTRAMUSCULAR | Status: AC
Start: 2011-09-19 — End: 2011-09-19
  Administered 2011-09-19: 20 mg via INTRAVENOUS

## 2011-09-19 MED ORDER — SODIUM CHLORIDE 0.9 % IV SOLN
Freq: Once | INTRAVENOUS | Status: AC
  Administered 2011-09-19: 09:00:00 via INTRAVENOUS

## 2011-09-19 MED ORDER — SODIUM CHLORIDE 0.9 % IV SOLN
375.0000 mg/m2 | Freq: Once | INTRAVENOUS | Status: AC
  Administered 2011-09-19: 700 mg via INTRAVENOUS
  Filled 2011-09-19: qty 70

## 2011-09-19 MED ORDER — ACETAMINOPHEN 325 MG PO TABS
650.0000 mg | ORAL_TABLET | Freq: Once | ORAL | Status: AC
  Administered 2011-09-19: 650 mg via ORAL

## 2011-09-19 MED ORDER — GUAIFENESIN ER 1200 MG PO TB12: 1.0000 | ORAL_TABLET | Freq: Two times a day (BID) | ORAL | Status: DC

## 2011-09-19 MED ORDER — ONDANSETRON 16 MG/50ML IVPB (CHCC)
16.0000 mg | Freq: Once | INTRAVENOUS | Status: AC
  Administered 2011-09-19: 16 mg via INTRAVENOUS

## 2011-09-19 MED ORDER — DIPHENHYDRAMINE HCL 25 MG PO CAPS
50.0000 mg | ORAL_CAPSULE | Freq: Once | ORAL | Status: AC
  Administered 2011-09-19: 50 mg via ORAL

## 2011-09-19 MED ORDER — SODIUM CHLORIDE 0.9 % IV SOLN
800.0000 mg/m2 | Freq: Once | INTRAVENOUS | Status: AC
  Administered 2011-09-19: 1400 mg via INTRAVENOUS
  Filled 2011-09-19: qty 70

## 2011-09-19 NOTE — Patient Instructions (Signed)
[facility] Discharge Instructions for Patients Receiving Chemotherapy  Today you received the following chemotherapy agents Rituxan, Cytoxan, Vincristine  To help prevent nausea and vomiting after your treatment, we encourage you to take your nausea medication    If you develop nausea and vomiting that is not controlled by your nausea medication, call the clinic. If it is after clinic hours your family physician or the after hours number for the clinic or go to the Emergency Department.   BELOW ARE SYMPTOMS THAT SHOULD BE REPORTED IMMEDIATELY:  *FEVER GREATER THAN 100.5 F  *CHILLS WITH OR WITHOUT FEVER  NAUSEA AND VOMITING THAT IS NOT CONTROLLED WITH YOUR NAUSEA MEDICATION  *UNUSUAL SHORTNESS OF BREATH  *UNUSUAL BRUISING OR BLEEDING  TENDERNESS IN MOUTH AND THROAT WITH OR WITHOUT PRESENCE OF ULCERS  *URINARY PROBLEMS  *BOWEL PROBLEMS  UNUSUAL RASH Items with * indicate a potential emergency and should be followed up as soon as possible.  One of the nurses will contact you 24 hours after your treatment. Please let the nurse know about any problems that you may have experienced. Feel free to call the clinic you have any questions or concerns. The clinic phone number is [phone].   I have been informed and understand all the instructions given to me. I know to contact the clinic, my physician, or go to the Emergency Department if any problems should occur. I do not have any questions at this time, but understand that I may call the clinic during office hours   should I have any questions or need assistance in obtaining follow up care.    __________________________________________  _____________  __________ Signature of Patient or Authorized Representative            Date                   Time    __________________________________________ Nurse's Signature    

## 2011-09-23 LAB — COMPREHENSIVE METABOLIC PANEL
ALT: <8 U/L (ref 0–53)
AST: 6 U/L (ref 0–37)
Albumin: 2.9 g/dL — ABNORMAL LOW (ref 3.5–5.2)
BUN: 8 mg/dL (ref 6–23)
Calcium: 8.7 mg/dL (ref 8.4–10.5)
Chloride: 102 mEq/L (ref 96–112)
Potassium: 4.4 mEq/L (ref 3.5–5.3)
Total Protein: 6.3 g/dL (ref 6.0–8.3)

## 2011-09-23 LAB — PROTEIN ELECTROPHORESIS, SERUM, WITH REFLEX
Alpha-1-Globulin: 7.7 % — ABNORMAL HIGH (ref 2.9–4.9)
Alpha-2-Globulin: 12.5 % — ABNORMAL HIGH (ref 7.1–11.8)
Beta 2: 28.9 % — ABNORMAL HIGH (ref 3.2–6.5)
Beta Globulin: 6.1 % (ref 4.7–7.2)
Gamma Globulin: 3.8 % — ABNORMAL LOW (ref 11.1–18.8)

## 2011-09-23 LAB — IGG, IGA, IGM: IgA: 8 mg/dL — ABNORMAL LOW (ref 68–379)

## 2011-09-23 LAB — KAPPA/LAMBDA LIGHT CHAINS: Kappa free light chain: 37.8 mg/dL — ABNORMAL HIGH (ref 0.33–1.94)

## 2011-09-24 NOTE — Progress Notes (Signed)
This office note has been dictated.

## 2011-09-24 NOTE — Progress Notes (Signed)
CC:   Elvina Sidle, M.D.  DIAGNOSIS:  Lymphoplasmacytic lymphoma.  CURRENT THERAPY:  The patient is status post 3 cycles of R-CVP.  INTERIM HISTORY:  [name] [name] comes in for followup.  He actually is looking better.  He is tolerating chemotherapy pretty well from my point of view.  There are still some issues with respect to him drinking but hopefully I think these appear to be better.  His response to chemo has been fairly consistent.  His monoclonal studies have improved.  Of note, we did give him some iron with his last dose of chemotherapy. His ferritin was only 215 but his iron saturation was 8.  His appetite has picked up a little bit.  He seems to have a little more energy.  PHYSICAL EXAMINATION:  General:  This is a fairly well-nourished white gentleman in no obvious distress.  Vital signs:  97.1, pulse 97, respiratory rate 20, blood pressure 90/56.  Weight is 145.  Head and neck:  Exam shows a normocephalic, atraumatic skull.  There are no ocular or oral lesions.  There are no palpable cervical or supraclavicular lymph nodes.  Lungs:  Clear bilaterally.  Cardiac: Regular rate and rhythm with normal S1 and S2.  There are no murmurs, rubs or bruits.  Abdomen:  Soft with good bowel sounds.  There is no palpable abdominal mass.  There is no fluid wave.  There is no palpable hepatosplenomegaly.  Back:  No tenderness over the spine, ribs or hips. Extremities:  Shows no clubbing, cyanosis or edema.  Skin:  No rashes, ecchymoses or petechiae.  LABORATORY STUDIES:  White cell count is 7, hemoglobin 11, hematocrit 35.4, platelet count 236.  Monoclonal spike is 1.76 g/dL.  IgM level is 2730 mg/dL.  His serum kappa light chain is 37.8 mg/dL.  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  Again, he is responding.  His protein levels are improving. I think he is tolerating chemo very nicely.  Again, we got him to stop drinking which I thought  was critical.  We will go ahead and plan to get him back to see [place] in another 3 weeks.    ______________________________ Josph Macho, M.D. PRE/MEDQ  D:  09/24/2011  T:  09/24/2011  Job:  [id number]

## 2011-10-11 ENCOUNTER — Ambulatory Visit (HOSPITAL_BASED_OUTPATIENT_CLINIC_OR_DEPARTMENT_OTHER): Payer: Medicare Other

## 2011-10-11 ENCOUNTER — Ambulatory Visit (HOSPITAL_BASED_OUTPATIENT_CLINIC_OR_DEPARTMENT_OTHER): Payer: Medicare Other | Admitting: Hematology & Oncology

## 2011-10-11 ENCOUNTER — Other Ambulatory Visit: Payer: Medicare Other | Admitting: Lab

## 2011-10-11 ENCOUNTER — Ambulatory Visit: Payer: Medicare Other | Admitting: Hematology & Oncology

## 2011-10-11 ENCOUNTER — Other Ambulatory Visit (HOSPITAL_BASED_OUTPATIENT_CLINIC_OR_DEPARTMENT_OTHER): Payer: Medicare Other | Admitting: Lab

## 2011-10-11 ENCOUNTER — Ambulatory Visit: Payer: Medicare Other

## 2011-10-11 VITALS — BP 98/60 | HR 104 | Temp 97.4°F | Ht 68.0 in | Wt 144.0 lb

## 2011-10-11 DIAGNOSIS — D649 Anemia, unspecified: Secondary | ICD-10-CM

## 2011-10-11 DIAGNOSIS — J449 Chronic obstructive pulmonary disease, unspecified: Secondary | ICD-10-CM

## 2011-10-11 DIAGNOSIS — Z5111 Encounter for antineoplastic chemotherapy: Secondary | ICD-10-CM

## 2011-10-11 DIAGNOSIS — C8589 Other specified types of non-Hodgkin lymphoma, extranodal and solid organ sites: Secondary | ICD-10-CM

## 2011-10-11 DIAGNOSIS — C880 Waldenstrom macroglobulinemia: Secondary | ICD-10-CM

## 2011-10-11 LAB — CBC WITH DIFFERENTIAL (CANCER CENTER ONLY)
BASO%: 1.4 % (ref 0.0–2.0)
EOS%: 4.1 % (ref 0.0–7.0)
HCT: 35.2 % — ABNORMAL LOW (ref 38.7–49.9)
LYMPH%: 22.6 % (ref 14.0–48.0)
MCH: 26.8 pg — ABNORMAL LOW (ref 28.0–33.4)
MCHC: 32.4 g/dL (ref 32.0–35.9)
MCV: 83 fL (ref 82–98)
MONO%: 23.4 % — ABNORMAL HIGH (ref 0.0–13.0)
NEUT%: 48.5 % (ref 40.0–80.0)
Platelets: 232 10*3/uL (ref 145–400)
RDW: 19.5 % — ABNORMAL HIGH (ref 11.1–15.7)
WBC: 6.4 10*3/uL (ref 4.0–10.0)

## 2011-10-11 MED ORDER — SODIUM CHLORIDE 0.9 % IV SOLN
Freq: Once | INTRAVENOUS | Status: AC
  Administered 2011-10-11: 09:00:00 via INTRAVENOUS

## 2011-10-11 MED ORDER — SODIUM CHLORIDE 0.9 % IV SOLN
375.0000 mg/m2 | Freq: Once | INTRAVENOUS | Status: AC
  Administered 2011-10-11: 700 mg via INTRAVENOUS
  Filled 2011-10-11: qty 70

## 2011-10-11 MED ORDER — SODIUM CHLORIDE 0.9 % IV SOLN
800.0000 mg/m2 | Freq: Once | INTRAVENOUS | Status: AC
  Administered 2011-10-11: 1400 mg via INTRAVENOUS
  Filled 2011-10-11: qty 70

## 2011-10-11 MED ORDER — DIPHENHYDRAMINE HCL 25 MG PO CAPS
50.0000 mg | ORAL_CAPSULE | Freq: Once | ORAL | Status: AC
  Administered 2011-10-11: 25 mg via ORAL

## 2011-10-11 MED ORDER — ACETAMINOPHEN-CODEINE #3 300-30 MG PO TABS: 1.0000 | ORAL_TABLET | Freq: Two times a day (BID) | ORAL | Status: AC | PRN

## 2011-10-11 MED ORDER — DEXAMETHASONE SODIUM PHOSPHATE 4 MG/ML IJ SOLN
20.0000 mg | Freq: Once | INTRAMUSCULAR | Status: AC
  Administered 2011-10-11: 20 mg via INTRAVENOUS

## 2011-10-11 MED ORDER — ONDANSETRON 16 MG/50ML IVPB (CHCC)
16.0000 mg | Freq: Once | INTRAVENOUS | Status: AC
  Administered 2011-10-11: 16 mg via INTRAVENOUS

## 2011-10-11 MED ORDER — VINCRISTINE SULFATE CHEMO INJECTION 1 MG/ML
1.5000 mg | Freq: Once | INTRAVENOUS | Status: AC
  Administered 2011-10-11: 1.5 mg via INTRAVENOUS
  Filled 2011-10-11: qty 1.5

## 2011-10-11 MED ORDER — ACETAMINOPHEN 325 MG PO TABS
650.0000 mg | ORAL_TABLET | Freq: Once | ORAL | Status: AC
  Administered 2011-10-11: 650 mg via ORAL

## 2011-10-11 NOTE — Progress Notes (Signed)
This office note has been dictated.

## 2011-10-11 NOTE — Progress Notes (Signed)
CC:   [name] [name], M.D.  DIAGNOSIS:  Lymphoplasmacytic lymphoma.  CURRENT THERAPY:  Patient status post 4 cycles of R-CVP.  INTERIM HISTORY:  [name] [name] comes in for a followup.  He is holding his own.  He is still having these falling episodes.  He says he just gets weak from his knees down.  This, in my mind, is not anything related to his treatment or to his lymphoma.  To me, I think this may be more related to some type of neuropathy.  He still has some breathing issues.  He is not coughing up anything.  He has had no fever.  There is no bleeding.  He has had no problem with bowels or bladder.  He says he is not drinking alcohol any longer.  We did give him iron back in May.  This helped with his anemia.  When we last checked his monoclonal studies, his monoclonal spike was 1.76 g/dL.  His IgM level was 2730 mg/dL.  His kappa light chain was 38 mg/dL.  PHYSICAL EXAMINATION:  This is a thin but very well-nourished white gentleman in no obvious distress.  Vital signs:  97.4, pulse 104, respiratory rate 22, blood pressure 98/60.  Weight is 144.  Head and neck:  Normocephalic, atraumatic skull.  There are no ocular or oral lesions.  There are no palpable cervical or supraclavicular lymph nodes. Lungs:  Clear to percussion and auscultation bilaterally.  Cardiac: Regular rate and rhythm with a normal S1 and S2.  There are no murmurs, rubs or bruits.  Abdomen:  Soft with good bowel sounds.  There is no palpable abdominal mass.  There is no palpable hepatosplenomegaly. Extremities:  No clubbing, cyanosis or edema.  Neurological:  No focal neurological deficits.  He has good strength in his legs bilaterally.  LABORATORY STUDIES:  White cell count is 6.4, hemoglobin 11.4, hematocrit 35.2, platelet count 232.  MCV is 83.  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  Clinically, he is responding.  He has really done well with chemo from my point  of view.  We will go ahead and plan for his 5th cycle of treatment.  I will set him up with a followup CT scan after his 6th cycle of treatment.  I would just like to see how he is responding.    ______________________________ [name] [name], M.D. PRE/MEDQ  D:  10/11/2011  T:  10/11/2011  Job:  [id number]

## 2011-10-11 NOTE — Patient Instructions (Addendum)
[facility] Discharge Instructions for Patients Receiving Chemotherapy  Today you received the following chemotherapy agents Rituxan, Vincristine, Cytoxan To help prevent nausea and vomiting after your treatment, we encourage you to take your nausea medication    If you develop nausea and vomiting that is not controlled by your nausea medication, call the clinic. If it is after clinic hours your family physician or the after hours number for the clinic or go to the Emergency Department.   BELOW ARE SYMPTOMS THAT SHOULD BE REPORTED IMMEDIATELY:  *FEVER GREATER THAN 100.5 F  *CHILLS WITH OR WITHOUT FEVER  NAUSEA AND VOMITING THAT IS NOT CONTROLLED WITH YOUR NAUSEA MEDICATION  *UNUSUAL SHORTNESS OF BREATH  *UNUSUAL BRUISING OR BLEEDING  TENDERNESS IN MOUTH AND THROAT WITH OR WITHOUT PRESENCE OF ULCERS  *URINARY PROBLEMS  *BOWEL PROBLEMS  UNUSUAL RASH Items with * indicate a potential emergency and should be followed up as soon as possible.  One of the nurses will contact you 24 hours after your treatment. Please let the nurse know about any problems that you may have experienced. Feel free to call the clinic you have any questions or concerns. The clinic phone number is [phone].   I have been informed and understand all the instructions given to me. I know to contact the clinic, my physician, or go to the Emergency Department if any problems should occur. I do not have any questions at this time, but understand that I may call the clinic during office hours   should I have any questions or need assistance in obtaining follow up care.    __________________________________________  _____________  __________ Signature of Patient or Authorized Representative            Date                   Time    __________________________________________ Nurse's Signature    

## 2011-10-15 LAB — COMPREHENSIVE METABOLIC PANEL
ALT: <8 U/L (ref 0–53)
AST: 7 U/L (ref 0–37)
Alkaline Phosphatase: 61 U/L (ref 39–117)
CO2: 27 mEq/L (ref 19–32)
Creatinine, Ser: 0.66 mg/dL (ref 0.50–1.35)
Sodium: 140 mEq/L (ref 135–145)
Total Bilirubin: 0.4 mg/dL (ref 0.3–1.2)
Total Protein: 6.7 g/dL (ref 6.0–8.3)

## 2011-10-15 LAB — PROTEIN ELECTROPHORESIS, SERUM, WITH REFLEX
Albumin ELP: 44.1 % — ABNORMAL LOW (ref 55.8–66.1)
Alpha-1-Globulin: 6.9 % — ABNORMAL HIGH (ref 2.9–4.9)
Beta 2: 27.6 % — ABNORMAL HIGH (ref 3.2–6.5)
Beta Globulin: 5.7 % (ref 4.7–7.2)

## 2011-10-15 LAB — IRON AND TIBC
TIBC: 239 ug/dL (ref 215–435)
UIBC: 175 ug/dL (ref 125–400)

## 2011-10-15 LAB — IGG, IGA, IGM: IgG (Immunoglobin G), Serum: 342 mg/dL — ABNORMAL LOW (ref 650–1600)

## 2011-10-15 LAB — KAPPA/LAMBDA LIGHT CHAINS: Kappa free light chain: 31.7 mg/dL — ABNORMAL HIGH (ref 0.33–1.94)

## 2011-10-31 ENCOUNTER — Ambulatory Visit (HOSPITAL_BASED_OUTPATIENT_CLINIC_OR_DEPARTMENT_OTHER): Payer: Medicare Other

## 2011-10-31 ENCOUNTER — Ambulatory Visit (HOSPITAL_BASED_OUTPATIENT_CLINIC_OR_DEPARTMENT_OTHER): Payer: Medicare Other | Admitting: Hematology & Oncology

## 2011-10-31 ENCOUNTER — Other Ambulatory Visit: Payer: Self-pay | Admitting: *Deleted

## 2011-10-31 ENCOUNTER — Other Ambulatory Visit (HOSPITAL_BASED_OUTPATIENT_CLINIC_OR_DEPARTMENT_OTHER): Payer: Medicare Other | Admitting: Lab

## 2011-10-31 VITALS — BP 99/56 | HR 91 | Temp 97.3°F | Ht 68.0 in | Wt 150.0 lb

## 2011-10-31 DIAGNOSIS — C880 Waldenstrom macroglobulinemia: Secondary | ICD-10-CM

## 2011-10-31 DIAGNOSIS — C8589 Other specified types of non-Hodgkin lymphoma, extranodal and solid organ sites: Secondary | ICD-10-CM

## 2011-10-31 DIAGNOSIS — J449 Chronic obstructive pulmonary disease, unspecified: Secondary | ICD-10-CM

## 2011-10-31 DIAGNOSIS — Z5111 Encounter for antineoplastic chemotherapy: Secondary | ICD-10-CM

## 2011-10-31 DIAGNOSIS — D649 Anemia, unspecified: Secondary | ICD-10-CM

## 2011-10-31 LAB — CBC WITH DIFFERENTIAL (CANCER CENTER ONLY)
BASO%: 1.3 % (ref 0.0–2.0)
EOS%: 2.8 % (ref 0.0–7.0)
MCH: 29.2 pg (ref 28.0–33.4)
MCHC: 32.8 g/dL (ref 32.0–35.9)
MONO%: 23 % — ABNORMAL HIGH (ref 0.0–13.0)
NEUT#: 3.7 10*3/uL (ref 1.5–6.5)
Platelets: 188 10*3/uL (ref 145–400)
RBC: 3.87 10*6/uL — ABNORMAL LOW (ref 4.20–5.70)
RDW: 19.8 % — ABNORMAL HIGH (ref 11.1–15.7)

## 2011-10-31 MED ORDER — SODIUM CHLORIDE 0.9 % IV SOLN
375.0000 mg/m2 | Freq: Once | INTRAVENOUS | Status: AC
  Administered 2011-10-31: 700 mg via INTRAVENOUS
  Filled 2011-10-31: qty 70

## 2011-10-31 MED ORDER — SODIUM CHLORIDE 0.9 % IV SOLN
800.0000 mg/m2 | Freq: Once | INTRAVENOUS | Status: AC
  Administered 2011-10-31: 1400 mg via INTRAVENOUS
  Filled 2011-10-31: qty 70

## 2011-10-31 MED ORDER — PREDNISONE 20 MG PO TABS: 60.0000 mg | ORAL_TABLET | Freq: Every day | ORAL | Status: DC

## 2011-10-31 MED ORDER — LORAZEPAM 0.5 MG PO TABS: ORAL_TABLET | ORAL | Status: DC

## 2011-10-31 MED ORDER — ONDANSETRON 16 MG/50ML IVPB (CHCC)
16.0000 mg | Freq: Once | INTRAVENOUS | Status: AC
  Administered 2011-10-31: 16 mg via INTRAVENOUS

## 2011-10-31 MED ORDER — SODIUM CHLORIDE 0.9 % IV SOLN
Freq: Once | INTRAVENOUS | Status: AC
  Administered 2011-10-31: 09:00:00 via INTRAVENOUS

## 2011-10-31 MED ORDER — VINCRISTINE SULFATE CHEMO INJECTION 1 MG/ML
1.5000 mg | Freq: Once | INTRAVENOUS | Status: AC
  Administered 2011-10-31: 1.5 mg via INTRAVENOUS
  Filled 2011-10-31: qty 1.5

## 2011-10-31 MED ORDER — ACETAMINOPHEN 325 MG PO TABS
650.0000 mg | ORAL_TABLET | Freq: Once | ORAL | Status: AC
  Administered 2011-10-31: 650 mg via ORAL

## 2011-10-31 MED ORDER — DEXAMETHASONE SODIUM PHOSPHATE 4 MG/ML IJ SOLN
20.0000 mg | Freq: Once | INTRAMUSCULAR | Status: AC
  Administered 2011-10-31: 20 mg via INTRAVENOUS

## 2011-10-31 MED ORDER — DIPHENHYDRAMINE HCL 25 MG PO CAPS
50.0000 mg | ORAL_CAPSULE | Freq: Once | ORAL | Status: AC
  Administered 2011-10-31: 50 mg via ORAL

## 2011-10-31 NOTE — Progress Notes (Signed)
This office note has been dictated.

## 2011-10-31 NOTE — Patient Instructions (Signed)
Cyclophosphamide injection What is this medicine? CYCLOPHOSPHAMIDE (sye kloe FOSS fa mide) is a chemotherapy drug. It slows the growth of cancer cells. This medicine is used to treat many types of cancer like lymphoma, myeloma, leukemia, breast cancer, and ovarian cancer, to name a few. It is also used to treat nephrotic syndrome in children. This medicine may be used for other purposes; ask your health care provider or pharmacist if you have questions. What should I tell my health care provider before I take this medicine? They need to know if you have any of these conditions: -blood disorders -history of other chemotherapy -history of radiation therapy -infection -kidney disease -liver disease -tumors in the bone marrow -an unusual or allergic reaction to cyclophosphamide, other chemotherapy, other medicines, foods, dyes, or preservatives -pregnant or trying to get pregnant -breast-feeding How should I use this medicine? This drug is usually given as an injection into a vein or muscle or by infusion into a vein. It is administered in a hospital or clinic by a specially trained health care professional. Talk to your pediatrician regarding the use of this medicine in children. While this drug may be prescribed for selected conditions, precautions do apply. Overdosage: If you think you have taken too much of this medicine contact a poison control center or emergency room at once. NOTE: This medicine is only for you. Do not share this medicine with others. What if I miss a dose? It is important not to miss your dose. Call your doctor or health care professional if you are unable to keep an appointment. What may interact with this medicine? Do not take this medicine with any of the following medications: -mibefradil -nalidixic acid This medicine may also interact with the following medications: -doxorubicin -etanercept -medicines to increase blood counts like filgrastim, pegfilgrastim,  sargramostim -medicines that block muscle or nerve pain -St. John's Wort -phenobarbital -succinylcholine chloride -trastuzumab -vaccines Talk to your doctor or health care professional before taking any of these medicines: -acetaminophen -aspirin -ibuprofen -ketoprofen -naproxen This list may not describe all possible interactions. Give your health care provider a list of all the medicines, herbs, non-prescription drugs, or dietary supplements you use. Also tell them if you smoke, drink alcohol, or use illegal drugs. Some items may interact with your medicine. What should I watch for while using this medicine? Visit your doctor for checks on your progress. This drug may make you feel generally unwell. This is not uncommon, as chemotherapy can affect healthy cells as well as cancer cells. Report any side effects. Continue your course of treatment even though you feel ill unless your doctor tells you to stop. Drink water or other fluids as directed. Urinate often, even at night. In some cases, you may be given additional medicines to help with side effects. Follow all directions for their use. Call your doctor or health care professional for advice if you get a fever, chills or sore throat, or other symptoms of a cold or flu. Do not treat yourself. This drug decreases your body's ability to fight infections. Try to avoid being around people who are sick. This medicine may increase your risk to bruise or bleed. Call your doctor or health care professional if you notice any unusual bleeding. Be careful brushing and flossing your teeth or using a toothpick because you may get an infection or bleed more easily. If you have any dental work done, tell your dentist you are receiving this medicine. Avoid taking products that contain aspirin, acetaminophen, ibuprofen, naproxen,  or ketoprofen unless instructed by your doctor. These medicines may hide a fever. Do not become pregnant while taking this  medicine. Women should inform their doctor if they wish to become pregnant or think they might be pregnant. There is a potential for serious side effects to an unborn child. Talk to your health care professional or pharmacist for more information. Do not breast-feed an infant while taking this medicine. Men should inform their doctor if they wish to father a child. This medicine may lower sperm counts. If you are going to have surgery, tell your doctor or health care professional that you have taken this medicine. What side effects may I notice from receiving this medicine? Side effects that you should report to your doctor or health care professional as soon as possible: -allergic reactions like skin rash, itching or hives, swelling of the face, lips, or tongue -low blood counts - this medicine may decrease the number of white blood cells, red blood cells and platelets. You may be at increased risk for infections and bleeding. -signs of infection - fever or chills, cough, sore throat, pain or difficulty passing urine -signs of decreased platelets or bleeding - bruising, pinpoint red spots on the skin, black, tarry stools, blood in the urine -signs of decreased red blood cells - unusually weak or tired, fainting spells, lightheadedness -breathing problems -dark urine -mouth sores -pain, swelling, redness at site where injected -swelling of the ankles, feet, hands -trouble passing urine or change in the amount of urine -weight gain -yellowing of the eyes or skin Side effects that usually do not require medical attention (report to your doctor or health care professional if they continue or are bothersome): -changes in nail or skin color -diarrhea -hair loss -loss of appetite -missed menstrual periods -nausea, vomiting -stomach pain This list may not describe all possible side effects. Call your doctor for medical advice about side effects. You may report side effects to FDA at  1-800-FDA-1088. Where should I keep my medicine? This drug is given in a hospital or clinic and will not be stored at home. NOTE: This sheet is a summary. It may not cover all possible information. If you have questions about this medicine, talk to your doctor, pharmacist, or health care provider.  2012, [employer] Standard. (07/07/2007 2:32:25 PM)Vincristine injection What is this medicine? VINCRISTINE (vin KRIS teen) is a chemotherapy drug. It slows the growth of cancer cells. This medicine is used to treat many types of cancer like Hodgkin's disease, leukemia, non-Hodgkin's lymphoma, neuroblastoma (brain cancer), rhabdomyosarcoma, and Wilms' tumor. This medicine may be used for other purposes; ask your health care provider or pharmacist if you have questions. What should I tell my health care provider before I take this medicine? They need to know if you have any of these conditions: -blood disorders -gout -infection (especially chickenpox, cold sores, or herpes) -kidney disease -liver disease -lung disease -nervous system disease like Charcot-Marie-Tooth (CMT) -recent or ongoing radiation therapy -an unusual or allergic reaction to vincristine, other chemotherapy agents, other medicines, foods, dyes, or preservatives -pregnant or trying to get pregnant -breast-feeding How should I use this medicine? This drug is given as an infusion into a vein. It is administered in a hospital or clinic by a specially trained health care professional. If you have pain, swelling, burning, or any unusual feeling around the site of your injection, tell your health care professional right away. Talk to your pediatrician regarding the use of this medicine in children. While this drug  may be prescribed for selected conditions, precautions do apply. Overdosage: If you think you have taken too much of this medicine contact a poison control center or emergency room at once. NOTE: This medicine is only for  you. Do not share this medicine with others. What if I miss a dose? It is important not to miss your dose. Call your doctor or health care professional if you are unable to keep an appointment. What may interact with this medicine? Do not take this medicine with any of the following medications: -itraconazole -mibefradil -voriconazole This medicine may also interact with the following medications: -cyclosporine -erythromycin -fluconazole -ketoconazole -medicines for HIV like delavirdine, efavirenz, nevirapine -medicines for seizures like ethotoin, fosphenotoin, phenytoin -medicines to increase blood counts like filgrastim, pegfilgrastim, sargramostim -other chemotherapy drugs like cisplatin, L-asparaginase, methotrexate, mitomycin, paclitaxel -pegaspargase -vaccines -zalcitabine, ddC Talk to your doctor or health care professional before taking any of these medicines: -acetaminophen -aspirin -ibuprofen -ketoprofen -naproxen This list may not describe all possible interactions. Give your health care provider a list of all the medicines, herbs, non-prescription drugs, or dietary supplements you use. Also tell them if you smoke, drink alcohol, or use illegal drugs. Some items may interact with your medicine. What should I watch for while using this medicine? Your condition will be monitored carefully while you are receiving this medicine. You will need important blood work done while you are taking this medicine. This drug may make you feel generally unwell. This is not uncommon, as chemotherapy can affect healthy cells as well as cancer cells. Report any side effects. Continue your course of treatment even though you feel ill unless your doctor tells you to stop. In some cases, you may be given additional medicines to help with side effects. Follow all directions for their use. Call your doctor or health care professional for advice if you get a fever, chills or sore throat, or other  symptoms of a cold or flu. Do not treat yourself. Avoid taking products that contain aspirin, acetaminophen, ibuprofen, naproxen, or ketoprofen unless instructed by your doctor. These medicines may hide a fever. Do not become pregnant while taking this medicine. Women should inform their doctor if they wish to become pregnant or think they might be pregnant. There is a potential for serious side effects to an unborn child. Talk to your health care professional or pharmacist for more information. Do not breast-feed an infant while taking this medicine. Men may have a lower sperm count while taking this medicine. Talk to your doctor if you plan to father a child. What side effects may I notice from receiving this medicine? Side effects that you should report to your doctor or health care professional as soon as possible: -allergic reactions like skin rash, itching or hives, swelling of the face, lips, or tongue -breathing problems -confusion or changes in emotions or moods -constipation -cough -mouth sores -muscle weakness -nausea and vomiting -pain, swelling, redness or irritation at the injection site -pain, tingling, numbness in the hands or feet -problems with balance, talking, walking -seizures -stomach pain -trouble passing urine or change in the amount of urine Side effects that usually do not require medical attention (report to your doctor or health care professional if they continue or are bothersome): -diarrhea -hair loss -jaw pain -loss of appetite This list may not describe all possible side effects. Call your doctor for medical advice about side effects. You may report side effects to FDA at 1-800-FDA-1088. Where should I keep my medicine? This drug  is given in a hospital or clinic and will not be stored at home. NOTE: This sheet is a summary. It may not cover all possible information. If you have questions about this medicine, talk to your doctor, pharmacist, or health care  provider.  2012, [employer] Standard. (12/28/2007 5:17:13 PM)Rituximab injection What is this medicine? RITUXIMAB (ri TUX i mab) is a monoclonal antibody. This medicine changes the way the body's immune system works. It is used commonly to treat non-Hodgkin's lymphoma and other conditions. In cancer cells, this drug targets a specific protein within cancer cells and stops the cancer cells from growing. It is also used to treat rhuematoid arthritis (RA). In RA, this medicine slow the inflammatory process and help reduce joint pain and swelling. This medicine is often used with other cancer or arthritis medications. This medicine may be used for other purposes; ask your health care provider or pharmacist if you have questions. What should I tell my health care provider before I take this medicine? They need to know if you have any of these conditions: -blood disorders -heart disease -history of hepatitis B -infection (especially a virus infection such as chickenpox, cold sores, or herpes) -irregular heartbeat -kidney disease -lung or breathing disease, like asthma -lupus -an unusual or allergic reaction to rituximab, mouse proteins, other medicines, foods, dyes, or preservatives -pregnant or trying to get pregnant -breast-feeding How should I use this medicine? This medicine is for infusion into a vein. It is administered in a hospital or clinic by a specially trained health care professional. A special MedGuide will be given to you by the pharmacist with each prescription and refill. Be sure to read this information carefully each time. Talk to your pediatrician regarding the use of this medicine in children. This medicine is not approved for use in children. Overdosage: If you think you have taken too much of this medicine contact a poison control center or emergency room at once. NOTE: This medicine is only for you. Do not share this medicine with others. What if I miss a dose? It is  important not to miss a dose. Call your doctor or health care professional if you are unable to keep an appointment. What may interact with this medicine? -cisplatin -medicines for blood pressure -some other medicines for arthritis -vaccines This list may not describe all possible interactions. Give your health care provider a list of all the medicines, herbs, non-prescription drugs, or dietary supplements you use. Also tell them if you smoke, drink alcohol, or use illegal drugs. Some items may interact with your medicine. What should I watch for while using this medicine? Report any side effects that you notice during your treatment right away, such as changes in your breathing, fever, chills, dizziness or lightheadedness. These effects are more common with the first dose. Visit your prescriber or health care professional for checks on your progress. You will need to have regular blood work. Report any other side effects. The side effects of this medicine can continue after you finish your treatment. Continue your course of treatment even though you feel ill unless your doctor tells you to stop. Call your doctor or health care professional for advice if you get a fever, chills or sore throat, or other symptoms of a cold or flu. Do not treat yourself. This drug decreases your body's ability to fight infections. Try to avoid being around people who are sick. This medicine may increase your risk to bruise or bleed. Call your doctor or health care  professional if you notice any unusual bleeding. Be careful brushing and flossing your teeth or using a toothpick because you may get an infection or bleed more easily. If you have any dental work done, tell your dentist you are receiving this medicine. Avoid taking products that contain aspirin, acetaminophen, ibuprofen, naproxen, or ketoprofen unless instructed by your doctor. These medicines may hide a fever. Do not become pregnant while taking this medicine.  Women should inform their doctor if they wish to become pregnant or think they might be pregnant. There is a potential for serious side effects to an unborn child. Talk to your health care professional or pharmacist for more information. Do not breast-feed an infant while taking this medicine. What side effects may I notice from receiving this medicine? Side effects that you should report to your doctor or health care professional as soon as possible: -allergic reactions like skin rash, itching or hives, swelling of the face, lips, or tongue -low blood counts - this medicine may decrease the number of white blood cells, red blood cells and platelets. You may be at increased risk for infections and bleeding. -signs of infection - fever or chills, cough, sore throat, pain or difficulty passing urine -signs of decreased platelets or bleeding - bruising, pinpoint red spots on the skin, black, tarry stools, blood in the urine -signs of decreased red blood cells - unusually weak or tired, fainting spells, lightheadedness -breathing problems -confused, not responsive -chest pain -fast, irregular heartbeat -feeling faint or lightheaded, falls -mouth sores -redness, blistering, peeling or loosening of the skin, including inside the mouth -stomach pain -swelling of the ankles, feet, or hands -trouble passing urine or change in the amount of urine Side effects that usually do not require medical attention (report to your doctor or other health care professional if they continue or are bothersome): -anxiety -headache -loss of appetite -muscle aches -nausea -night sweats This list may not describe all possible side effects. Call your doctor for medical advice about side effects. You may report side effects to FDA at 1-800-FDA-1088. Where should I keep my medicine? This drug is given in a hospital or clinic and will not be stored at home. NOTE: This sheet is a summary. It may not cover all possible  information. If you have questions about this medicine, talk to your doctor, pharmacist, or health care provider.  2012, [employer]/Gold Standard. (11/30/2007 2:04:59 PM)

## 2011-11-01 NOTE — Progress Notes (Signed)
CC:   [name] [name], M.D.  DIAGNOSIS:  Lymphoplastmocytic lymphoma.  CURRENT THERAPY:  Status post 5 cycles of RCVP.  INTERIM HISTORY:  [name] [name] comes in for followup.  He continues to do quite well.  I think he is tolerating chemotherapy nicely.  He still has some weakness in his legs.  I think he needs some physical therapy.  I do not believe that this is, at all, related to his lymphoma or to his chemotherapy.  He has had no fever.  He has had no sweats.  He has had no abdominal pain.  His weight is up a little bit.  His appetite is doing better.  When we checked his protein studies, his monoclonal spike was down to 1.7 g/dL.  IgM level was 2800 mg/dL.  Her kappa light chain is 32 mg/dL.  His last iron studies looked much better with iron saturation 27% and ferritin of 289.  PHYSICAL EXAM:  General:  This is a well-developed, well-nourished, white gentleman in no obvious distress.  Vital signs:  Show a temperature of 97.3, pulse 91, respiratory 20, blood pressure 99/56. Weight is 150.  Head and neck:  Normocephalic, atraumatic skull.  There are no ocular or oral lesions.  There are no palpable cervical or supraclavicular lymph nodes.  Lungs:  Clear bilaterally.  Cardiac: Regular rate and rhythm with normal S1, S2.  There are no murmurs, rubs or bruits.  Abdomen:  Soft with good bowel sounds.  There is no palpable abdominal mass.  There is no fluid wave.  There is no palpable hepatosplenomegaly.  Back:  No tenderness over the spine, ribs, or hips. Extremities:  Shows no clubbing, cyanosis or edema.  Neurological: Shows no focal neurological deficits.  Skin:  No rash, ecchymosis or petechia.  LABORATORY STUDIES:  White cell count is 6.7, hemoglobin 11.3, hematocrit 34.4, platelet count 188.  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  He is responding.  His blood counts have improved nicely.  He has had a decrease in his monoclonal  studies.  We will go ahead and plan for a followup CT scan after his 6th cycle of treatment.  I would like to see how things look with respect to lymphadenopathy and splenomegaly.  He will need to have physical therapy.  I really believe that physical therapy can help him out.  We will plan for 7th cycle of treatment in 3 weeks.    ______________________________ [name] [name], M.D. PRE/MEDQ  D:  10/31/2011  T:  11/01/2011  Job:  [id number]

## 2011-11-04 LAB — PROTEIN ELECTROPHORESIS, SERUM, WITH REFLEX
Albumin ELP: 44.4 % — ABNORMAL LOW (ref 55.8–66.1)
Alpha-1-Globulin: 6.7 % — ABNORMAL HIGH (ref 2.9–4.9)
Beta 2: 28.2 % — ABNORMAL HIGH (ref 3.2–6.5)
Beta Globulin: 5.4 % (ref 4.7–7.2)
Total Protein, Serum Electrophoresis: 6.3 g/dL (ref 6.0–8.3)

## 2011-11-04 LAB — COMPREHENSIVE METABOLIC PANEL
AST: 8 U/L (ref 0–37)
Albumin: 3.1 g/dL — ABNORMAL LOW (ref 3.5–5.2)
Alkaline Phosphatase: 47 U/L (ref 39–117)
Potassium: 4.5 mEq/L (ref 3.5–5.3)
Sodium: 139 mEq/L (ref 135–145)
Total Protein: 6.3 g/dL (ref 6.0–8.3)

## 2011-11-04 LAB — KAPPA/LAMBDA LIGHT CHAINS
Kappa:Lambda Ratio: 56.62 — ABNORMAL HIGH (ref 0.26–1.65)
Lambda Free Lght Chn: 0.74 mg/dL (ref 0.57–2.63)

## 2011-11-07 ENCOUNTER — Ambulatory Visit: Payer: Medicare Other | Admitting: Rehabilitation

## 2011-11-14 ENCOUNTER — Ambulatory Visit (HOSPITAL_BASED_OUTPATIENT_CLINIC_OR_DEPARTMENT_OTHER)
Admission: RE | Admit: 2011-11-14 | Discharge: 2011-11-14 | Disposition: A | Discharge status: Home / Self Care | Payer: Medicare Other | Source: Ambulatory Visit | Attending: Hematology & Oncology | Admitting: Hematology & Oncology

## 2011-11-14 DIAGNOSIS — C8800 Waldenstrom macroglobulinemia not having achieved remission: Secondary | ICD-10-CM | POA: Insufficient documentation

## 2011-11-14 DIAGNOSIS — Z9221 Personal history of antineoplastic chemotherapy: Secondary | ICD-10-CM | POA: Insufficient documentation

## 2011-11-14 MED ORDER — IOHEXOL 300 MG/ML  SOLN: 100.0000 mL | Freq: Once | INTRAMUSCULAR | Status: AC | PRN

## 2011-11-21 ENCOUNTER — Other Ambulatory Visit (HOSPITAL_BASED_OUTPATIENT_CLINIC_OR_DEPARTMENT_OTHER): Payer: Medicare Other | Admitting: Lab

## 2011-11-21 ENCOUNTER — Ambulatory Visit (HOSPITAL_BASED_OUTPATIENT_CLINIC_OR_DEPARTMENT_OTHER): Payer: Medicare Other

## 2011-11-21 ENCOUNTER — Ambulatory Visit (HOSPITAL_BASED_OUTPATIENT_CLINIC_OR_DEPARTMENT_OTHER): Payer: Medicare Other | Admitting: Medical

## 2011-11-21 VITALS — BP 93/54 | HR 83 | Temp 97.3°F | Resp 20 | Ht 68.0 in | Wt 149.0 lb

## 2011-11-21 VITALS — BP 106/67 | HR 75 | Temp 97.0°F | Resp 18

## 2011-11-21 DIAGNOSIS — C8589 Other specified types of non-Hodgkin lymphoma, extranodal and solid organ sites: Secondary | ICD-10-CM

## 2011-11-21 DIAGNOSIS — C880 Waldenstrom macroglobulinemia: Secondary | ICD-10-CM

## 2011-11-21 DIAGNOSIS — Z5111 Encounter for antineoplastic chemotherapy: Secondary | ICD-10-CM

## 2011-11-21 DIAGNOSIS — C8590 Non-Hodgkin lymphoma, unspecified, unspecified site: Secondary | ICD-10-CM

## 2011-11-21 LAB — CBC WITH DIFFERENTIAL (CANCER CENTER ONLY)
BASO%: 1.4 % (ref 0.0–2.0)
EOS%: 4.1 % (ref 0.0–7.0)
Eosinophils Absolute: 0.3 10*3/uL (ref 0.0–0.5)
LYMPH%: 25.4 % (ref 14.0–48.0)
MCH: 30.5 pg (ref 28.0–33.4)
MCHC: 33.8 g/dL (ref 32.0–35.9)
MONO%: 21.9 % — ABNORMAL HIGH (ref 0.0–13.0)
NEUT#: 3 10*3/uL (ref 1.5–6.5)
Platelets: 216 10*3/uL (ref 145–400)
RBC: 3.87 10*6/uL — ABNORMAL LOW (ref 4.20–5.70)
RDW: 16.7 % — ABNORMAL HIGH (ref 11.1–15.7)

## 2011-11-21 MED ORDER — SODIUM CHLORIDE 0.9 % IV SOLN
375.0000 mg/m2 | Freq: Once | INTRAVENOUS | Status: AC
  Administered 2011-11-21: 700 mg via INTRAVENOUS
  Filled 2011-11-21: qty 70

## 2011-11-21 MED ORDER — ACETAMINOPHEN 325 MG PO TABS
650.0000 mg | ORAL_TABLET | Freq: Once | ORAL | Status: AC
  Administered 2011-11-21: 650 mg via ORAL

## 2011-11-21 MED ORDER — DIPHENHYDRAMINE HCL 25 MG PO CAPS
50.0000 mg | ORAL_CAPSULE | Freq: Once | ORAL | Status: AC
  Administered 2011-11-21: 50 mg via ORAL

## 2011-11-21 MED ORDER — SODIUM CHLORIDE 0.9 % IJ SOLN
1.5000 mg | Freq: Once | INTRAMUSCULAR | Status: AC
  Administered 2011-11-21: 1.5 mg via INTRAVENOUS
  Filled 2011-11-21: qty 1.5

## 2011-11-21 MED ORDER — SODIUM CHLORIDE 0.9 % IV SOLN
Freq: Once | INTRAVENOUS | Status: AC
  Administered 2011-11-21: 10:00:00 via INTRAVENOUS

## 2011-11-21 MED ORDER — SODIUM CHLORIDE 0.9 % IV SOLN
800.0000 mg/m2 | Freq: Once | INTRAVENOUS | Status: AC
  Administered 2011-11-21: 1400 mg via INTRAVENOUS
  Filled 2011-11-21: qty 70

## 2011-11-21 MED ORDER — DEXAMETHASONE SODIUM PHOSPHATE 4 MG/ML IJ SOLN
20.0000 mg | Freq: Once | INTRAMUSCULAR | Status: AC
  Administered 2011-11-21: 20 mg via INTRAVENOUS

## 2011-11-21 MED ORDER — ONDANSETRON 16 MG/50ML IVPB (CHCC)
16.0000 mg | Freq: Once | INTRAVENOUS | Status: AC
  Administered 2011-11-21: 16 mg via INTRAVENOUS

## 2011-11-21 MED ORDER — SODIUM CHLORIDE 0.9 % IJ SOLN
10.0000 mL | INTRAMUSCULAR | Status: DC | PRN
  Filled 2011-11-21: qty 10

## 2011-11-21 NOTE — Patient Instructions (Signed)
[facility] Discharge Instructions for Patients Receiving Chemotherapy  Today you received the following chemotherapy agents Rituxan, Vincristine, Cytoxan To help prevent nausea and vomiting after your treatment, we encourage you to take your nausea medication    If you develop nausea and vomiting that is not controlled by your nausea medication, call the clinic. If it is after clinic hours your family physician or the after hours number for the clinic or go to the Emergency Department.   BELOW ARE SYMPTOMS THAT SHOULD BE REPORTED IMMEDIATELY:  *FEVER GREATER THAN 100.5 F  *CHILLS WITH OR WITHOUT FEVER  NAUSEA AND VOMITING THAT IS NOT CONTROLLED WITH YOUR NAUSEA MEDICATION  *UNUSUAL SHORTNESS OF BREATH  *UNUSUAL BRUISING OR BLEEDING  TENDERNESS IN MOUTH AND THROAT WITH OR WITHOUT PRESENCE OF ULCERS  *URINARY PROBLEMS  *BOWEL PROBLEMS  UNUSUAL RASH Items with * indicate a potential emergency and should be followed up as soon as possible.  One of the nurses will contact you 24 hours after your treatment. Please let the nurse know about any problems that you may have experienced. Feel free to call the clinic you have any questions or concerns. The clinic phone number is [phone].   I have been informed and understand all the instructions given to me. I know to contact the clinic, my physician, or go to the Emergency Department if any problems should occur. I do not have any questions at this time, but understand that I may call the clinic during office hours   should I have any questions or need assistance in obtaining follow up care.    __________________________________________  _____________  __________ Signature of Patient or Authorized Representative            Date                   Time    __________________________________________ Nurse's Signature

## 2011-11-21 NOTE — Progress Notes (Signed)
Patient Name : [name] [name], [name] MR #[id number] DOB: 1940/11/21 Encounter Date: 11/21/2011 Dictated by Eunice Blase, PA-C  Diagnoses: Lymphoplasmacytic lymphoma.  Current therapy: Status post 6 cycles of RCVP.  Interim history: [name] [name] [name] Maduro presents today for an office followup visit.  Overall, he continues to do, white, well and is tolerating the chemotherapy, nicely.  He still continues to report some weakness in his legs.  He, reports that his insurance will not cover him to have physical therapy.  I really can't see why his insurance won't cover a short period of physical therapy and I advised his wife to call the company back.  If there is any documentation or anything that we need to do on hour and we will be glad to do it.  He is not reporting any fevers, chills, or night sweats.  He is not reporting any abdominal pain.  His weight is holding stable.  He has a good appetite.  We checked his protein studies last his IgM level was 2580, kappa light chain 42, an M spike 1.7 g/dL. Marland Kitchen  He denies any headaches or blurry visions.  He denies any obvious, bleeding.  He did have a CT scan of the chest, abdomen, and pelvis back on 11/14/2011.  The CT of the chest, revealed improving of mediastinal/retrocrural,  Lymphadenopathy.  His retrocrural lymphadenopathy, measuring 14 mm, short axis on the right, an 11 mm on the left previously, was 22, and 13 mm, respectively.  Small pre-vascular/mediastinal nodes, measuring up to 7 mm short axis, and 8 mm short axis, anterior paracardial node was previously 9 mm.  CT of the abdomen, and pelvis revealed improving upper abdominal/retroperitoneal lymphadenopathy.  There is no abdominopelvic ascites.  Of note, his spleen is normal size, measuring 9.0 cm. .  Overall, this is a good report and he is responding to chemotherapy.  Review of Systems: Pt. Denies any changes in their vision, hearing, adenopathy, fevers, chills, nausea, vomiting, diarrhea, constipation, chest pain,  shortness of breath, passing blood, passing out, blacking out,  any changes in skin, joints, neurologic or psychiatric except as noted.  Physical Exam: This is a pleasant, 71 year old, well-developed, well-nourished, gentleman, in no obvious distress Vitals: Temperature is 97.3 degrees, pulse 83, respirations 20, blood pressure 93/54 weight 149 pounds HEENT reveals a normocephalic, atraumatic skull, no scleral icterus, no oral lesions  Neck is supple without any cervical or supraclavicular adenopathy.  Lungs are clear to auscultation bilaterally. There are no wheezes, rales or rhonci Cardiac is regular rate and rhythm with a normal S1 and S2. There are no murmurs, rubs, or bruits.  Abdomen is soft with good bowel sounds there's no palpable mass. There is no palpable hepatosplenomegaly. There is no palpable fluid wave.  Musculoskeletal no tenderness of the spine, ribs, or hips.  Extremities there are no clubbing, cyanosis, or edema.  Skin no petechia, purpura or ecchymosis Neurologic is nonfocal.  Laboratory Data: White count 6.4, hemoglobin 11.8, hematocrit 34.9, platelets 216,000 Protein studies are pending  Imaging: CT C/A.P on 11/14/11  CT of the chest reveal improving mediastinal/retrocrural lymphadenopathy  CT of the abdomen, and pelvis revealed improving upper abdominal/retroperitoneal lymphadenopathy   Current Outpatient Prescriptions on File Prior to Visit  Medication Sig Dispense Refill  . acetaminophen (TYLENOL) 325 MG tablet Take 650 mg by mouth every 6 (six) hours as needed.      Marland Kitchen acetaminophen-codeine (TYLENOL #3) 300-30 MG per tablet       . diphenhydrAMINE (BENADRYL) 25 MG tablet Take 25  mg by mouth every 6 (six) hours as needed.      . fish oil-omega-3 fatty acids 1000 MG capsule Take 360 mg by mouth daily.       . Guaifenesin 1200 MG TB12 Take 1 tablet (1,200 mg total) by mouth 2 (two) times daily.  20 each  4  . LORazepam (ATIVAN) 0.5 MG tablet 1-2 tabs PO/SL every 8  hours as needed for anxiety, sleep, or nausea.  60 tablet  2  . ondansetron (ZOFRAN) 8 MG tablet Take 1 tablet (8 mg total) by mouth every 8 (eight) hours as needed for nausea.  20 tablet  3  . predniSONE (DELTASONE) 20 MG tablet Take 3 tablets (60 mg total) by mouth daily. For 5 days post Chemo  15 tablet  3  . Prenat-FeFum-FePo-FA-Omega 3 (TARON-C DHA) 53.5-38-1 MG CAPS TAKE 1 CAPSULE BY MOUTH DAILY  30 capsule  11  . PROAIR HFA 108 (90 BASE) MCG/ACT inhaler as needed.      . promethazine (PHENERGAN) 12.5 MG tablet Take 1 tablet (12.5 mg total) by mouth every 6 (six) hours as needed for nausea.  30 tablet  3    Assessment/Plan: This is a pleasant, 71 year old, gentleman, with the following issues: #1 lymphoplasmacytic lymphoma.  Overall, he is responding quite nicely.  His blood counts have continued to improve.  He does have a decrease in his myocardial studies.  He will go ahead and receive his seventh cycle of RCVP today.  #2 followup-[name] [name] [name] will follow back up with [place] in 3 weeks but before then should there be questions or concerns.

## 2011-11-25 LAB — COMPREHENSIVE METABOLIC PANEL
Alkaline Phosphatase: 59 U/L (ref 39–117)
BUN: 8 mg/dL (ref 6–23)
CO2: 29 mEq/L (ref 19–32)
Creatinine, Ser: 0.63 mg/dL (ref 0.50–1.35)
Glucose, Bld: 78 mg/dL (ref 70–99)
Sodium: 139 mEq/L (ref 135–145)
Total Bilirubin: 0.3 mg/dL (ref 0.3–1.2)

## 2011-11-25 LAB — PROTEIN ELECTROPHORESIS, SERUM, WITH REFLEX
Albumin ELP: 47.1 % — ABNORMAL LOW (ref 55.8–66.1)
Alpha-1-Globulin: 5.9 % — ABNORMAL HIGH (ref 2.9–4.9)
Alpha-2-Globulin: 10.7 % (ref 7.1–11.8)
Beta 2: 27 % — ABNORMAL HIGH (ref 3.2–6.5)
Beta Globulin: 5.5 % (ref 4.7–7.2)
Total Protein, Serum Electrophoresis: 6.9 g/dL (ref 6.0–8.3)

## 2011-11-25 LAB — IGG, IGA, IGM
IgA: 8 mg/dL — ABNORMAL LOW (ref 68–379)
IgG (Immunoglobin G), Serum: 320 mg/dL — ABNORMAL LOW (ref 650–1600)

## 2011-12-11 ENCOUNTER — Ambulatory Visit (HOSPITAL_BASED_OUTPATIENT_CLINIC_OR_DEPARTMENT_OTHER): Payer: Medicare Other

## 2011-12-11 ENCOUNTER — Other Ambulatory Visit (HOSPITAL_BASED_OUTPATIENT_CLINIC_OR_DEPARTMENT_OTHER): Payer: Medicare Other | Admitting: Lab

## 2011-12-11 ENCOUNTER — Other Ambulatory Visit: Payer: Self-pay | Admitting: Hematology & Oncology

## 2011-12-11 ENCOUNTER — Ambulatory Visit (HOSPITAL_BASED_OUTPATIENT_CLINIC_OR_DEPARTMENT_OTHER): Payer: Medicare Other | Admitting: Hematology & Oncology

## 2011-12-11 VITALS — BP 104/57 | HR 104 | Temp 98.2°F | Resp 20 | Ht 68.0 in | Wt 151.0 lb

## 2011-12-11 VITALS — BP 97/52 | HR 93 | Temp 97.3°F | Resp 20

## 2011-12-11 DIAGNOSIS — C8589 Other specified types of non-Hodgkin lymphoma, extranodal and solid organ sites: Secondary | ICD-10-CM

## 2011-12-11 DIAGNOSIS — C880 Waldenstrom macroglobulinemia: Secondary | ICD-10-CM

## 2011-12-11 DIAGNOSIS — Z5111 Encounter for antineoplastic chemotherapy: Secondary | ICD-10-CM

## 2011-12-11 LAB — CBC WITH DIFFERENTIAL (CANCER CENTER ONLY)
BASO#: 0.1 10*3/uL (ref 0.0–0.2)
EOS%: 4.6 % (ref 0.0–7.0)
HGB: 11.8 g/dL — ABNORMAL LOW (ref 13.0–17.1)
LYMPH%: 26.6 % (ref 14.0–48.0)
MCH: 31.6 pg (ref 28.0–33.4)
MCHC: 34.3 g/dL (ref 32.0–35.9)
MONO%: 21.6 % — ABNORMAL HIGH (ref 0.0–13.0)
NEUT#: 2.7 10*3/uL (ref 1.5–6.5)

## 2011-12-11 MED ORDER — ACETAMINOPHEN 325 MG PO TABS
650.0000 mg | ORAL_TABLET | Freq: Once | ORAL | Status: AC
  Administered 2011-12-11: 650 mg via ORAL

## 2011-12-11 MED ORDER — DEXAMETHASONE SODIUM PHOSPHATE 4 MG/ML IJ SOLN
20.0000 mg | Freq: Once | INTRAMUSCULAR | Status: AC
  Administered 2011-12-11: 20 mg via INTRAVENOUS

## 2011-12-11 MED ORDER — ONDANSETRON 16 MG/50ML IVPB (CHCC)
16.0000 mg | Freq: Once | INTRAVENOUS | Status: AC
  Administered 2011-12-11: 16 mg via INTRAVENOUS

## 2011-12-11 MED ORDER — SODIUM CHLORIDE 0.9 % IV SOLN
800.0000 mg/m2 | Freq: Once | INTRAVENOUS | Status: AC
  Administered 2011-12-11: 1400 mg via INTRAVENOUS
  Filled 2011-12-11: qty 70

## 2011-12-11 MED ORDER — DIPHENHYDRAMINE HCL 25 MG PO CAPS
50.0000 mg | ORAL_CAPSULE | Freq: Once | ORAL | Status: AC
  Administered 2011-12-11: 50 mg via ORAL

## 2011-12-11 MED ORDER — SODIUM CHLORIDE 0.9 % IV SOLN
375.0000 mg/m2 | Freq: Once | INTRAVENOUS | Status: AC
  Administered 2011-12-11: 700 mg via INTRAVENOUS
  Filled 2011-12-11: qty 70

## 2011-12-11 MED ORDER — VINCRISTINE SULFATE CHEMO INJECTION 1 MG/ML
1.5000 mg | Freq: Once | INTRAVENOUS | Status: AC
  Administered 2011-12-11: 1.5 mg via INTRAVENOUS
  Filled 2011-12-11: qty 1.5

## 2011-12-11 MED ORDER — SODIUM CHLORIDE 0.9 % IV SOLN
Freq: Once | INTRAVENOUS | Status: AC
  Administered 2011-12-11: 09:00:00 via INTRAVENOUS

## 2011-12-11 NOTE — Progress Notes (Signed)
This office note has been dictated.

## 2011-12-11 NOTE — Patient Instructions (Signed)
Do not drink.  His scans have been set up for October.  A bone marrow test has been set up for October 15 at [facility]. B. there at 7 AM in the morning. Someone must drive you. Do not eat anything after midnight.

## 2011-12-11 NOTE — Patient Instructions (Signed)
Vincristine injection  What is this medicine? VINCRISTINE (vin KRIS teen) is a chemotherapy drug. It slows the growth of cancer cells. This medicine is used to treat many types of cancer like Hodgkin's disease, leukemia, non-Hodgkin's lymphoma, neuroblastoma (brain cancer), rhabdomyosarcoma, and Wilms' tumor. This medicine may be used for other purposes; ask your health care provider or pharmacist if you have questions. What should I tell my health care provider before I take this medicine? They need to know if you have any of these conditions: -blood disorders -gout -infection (especially chickenpox, cold sores, or herpes) -kidney disease -liver disease -lung disease -nervous system disease like Charcot-Marie-Tooth (CMT) -recent or ongoing radiation therapy -an unusual or allergic reaction to vincristine, other chemotherapy agents, other medicines, foods, dyes, or preservatives -pregnant or trying to get pregnant -breast-feeding How should I use this medicine? This drug is given as an infusion into a vein. It is administered in a hospital or clinic by a specially trained health care professional. If you have pain, swelling, burning, or any unusual feeling around the site of your injection, tell your health care professional right away. Talk to your pediatrician regarding the use of this medicine in children. While this drug may be prescribed for selected conditions, precautions do apply. Overdosage: If you think you have taken too much of this medicine contact a poison control center or emergency room at once. NOTE: This medicine is only for you. Do not share this medicine with others. What if I miss a dose? It is important not to miss your dose. Call your doctor or health care professional if you are unable to keep an appointment. What may interact with this medicine? Do not take this medicine with any of the following medications: -itraconazole -mibefradil -voriconazole This medicine  may also interact with the following medications: -cyclosporine -erythromycin -fluconazole -ketoconazole -medicines for HIV like delavirdine, efavirenz, nevirapine -medicines for seizures like ethotoin, fosphenotoin, phenytoin -medicines to increase blood counts like filgrastim, pegfilgrastim, sargramostim -other chemotherapy drugs like cisplatin, L-asparaginase, methotrexate, mitomycin, paclitaxel -pegaspargase -vaccines -zalcitabine, ddC Talk to your doctor or health care professional before taking any of these medicines: -acetaminophen -aspirin -ibuprofen -ketoprofen -naproxen This list may not describe all possible interactions. Give your health care provider a list of all the medicines, herbs, non-prescription drugs, or dietary supplements you use. Also tell them if you smoke, drink alcohol, or use illegal drugs. Some items may interact with your medicine. What should I watch for while using this medicine? Your condition will be monitored carefully while you are receiving this medicine. You will need important blood work done while you are taking this medicine. This drug may make you feel generally unwell. This is not uncommon, as chemotherapy can affect healthy cells as well as cancer cells. Report any side effects. Continue your course of treatment even though you feel ill unless your doctor tells you to stop. In some cases, you may be given additional medicines to help with side effects. Follow all directions for their use. Call your doctor or health care professional for advice if you get a fever, chills or sore throat, or other symptoms of a cold or flu. Do not treat yourself. Avoid taking products that contain aspirin, acetaminophen, ibuprofen, naproxen, or ketoprofen unless instructed by your doctor. These medicines may hide a fever. Do not become pregnant while taking this medicine. Women should inform their doctor if they wish to become pregnant or think they might be pregnant.  There is a potential for serious side effects  to an unborn child. Talk to your health care professional or pharmacist for more information. Do not breast-feed an infant while taking this medicine. Men may have a lower sperm count while taking this medicine. Talk to your doctor if you plan to father a child. What side effects may I notice from receiving this medicine? Side effects that you should report to your doctor or health care professional as soon as possible: -allergic reactions like skin rash, itching or hives, swelling of the face, lips, or tongue -breathing problems -confusion or changes in emotions or moods -constipation -cough -mouth sores -muscle weakness -nausea and vomiting -pain, swelling, redness or irritation at the injection site -pain, tingling, numbness in the hands or feet -problems with balance, talking, walking -seizures -stomach pain -trouble passing urine or change in the amount of urine Side effects that usually do not require medical attention (report to your doctor or health care professional if they continue or are bothersome): -diarrhea -hair loss -jaw pain -loss of appetite This list may not describe all possible side effects. Call your doctor for medical advice about side effects. You may report side effects to FDA at 1-800-FDA-1088. Where should I keep my medicine? This drug is given in a hospital or clinic and will not be stored at home. NOTE: This sheet is a summary. It may not cover all possible information. If you have questions about this medicine, talk to your doctor, pharmacist, or health care provider.  2012, Elsevier/Gold Standard. (12/28/2007 5:17:13 PM)   Rituximab injection  What is this medicine? RITUXIMAB (ri TUX i mab) is a monoclonal antibody. This medicine changes the way the body's immune system works. It is used commonly to treat non-Hodgkin's lymphoma and other conditions. In cancer cells, this drug targets a specific protein  within cancer cells and stops the cancer cells from growing. It is also used to treat rhuematoid arthritis (RA). In RA, this medicine slow the inflammatory process and help reduce joint pain and swelling. This medicine is often used with other cancer or arthritis medications. This medicine may be used for other purposes; ask your health care provider or pharmacist if you have questions. What should I tell my health care provider before I take this medicine? They need to know if you have any of these conditions: -blood disorders -heart disease -history of hepatitis B -infection (especially a virus infection such as chickenpox, cold sores, or herpes) -irregular heartbeat -kidney disease -lung or breathing disease, like asthma -lupus -an unusual or allergic reaction to rituximab, mouse proteins, other medicines, foods, dyes, or preservatives -pregnant or trying to get pregnant -breast-feeding How should I use this medicine? This medicine is for infusion into a vein. It is administered in a hospital or clinic by a specially trained health care professional. A special MedGuide will be given to you by the pharmacist with each prescription and refill. Be sure to read this information carefully each time. Talk to your pediatrician regarding the use of this medicine in children. This medicine is not approved for use in children. Overdosage: If you think you have taken too much of this medicine contact a poison control center or emergency room at once. NOTE: This medicine is only for you. Do not share this medicine with others. What if I miss a dose? It is important not to miss a dose. Call your doctor or health care professional if you are unable to keep an appointment. What may interact with this medicine? -cisplatin -medicines for blood pressure -some other  medicines for arthritis -vaccines This list may not describe all possible interactions. Give your health care provider a list of all the  medicines, herbs, non-prescription drugs, or dietary supplements you use. Also tell them if you smoke, drink alcohol, or use illegal drugs. Some items may interact with your medicine. What should I watch for while using this medicine? Report any side effects that you notice during your treatment right away, such as changes in your breathing, fever, chills, dizziness or lightheadedness. These effects are more common with the first dose. Visit your prescriber or health care professional for checks on your progress. You will need to have regular blood work. Report any other side effects. The side effects of this medicine can continue after you finish your treatment. Continue your course of treatment even though you feel ill unless your doctor tells you to stop. Call your doctor or health care professional for advice if you get a fever, chills or sore throat, or other symptoms of a cold or flu. Do not treat yourself. This drug decreases your body's ability to fight infections. Try to avoid being around people who are sick. This medicine may increase your risk to bruise or bleed. Call your doctor or health care professional if you notice any unusual bleeding. Be careful brushing and flossing your teeth or using a toothpick because you may get an infection or bleed more easily. If you have any dental work done, tell your dentist you are receiving this medicine. Avoid taking products that contain aspirin, acetaminophen, ibuprofen, naproxen, or ketoprofen unless instructed by your doctor. These medicines may hide a fever. Do not become pregnant while taking this medicine. Women should inform their doctor if they wish to become pregnant or think they might be pregnant. There is a potential for serious side effects to an unborn child. Talk to your health care professional or pharmacist for more information. Do not breast-feed an infant while taking this medicine. What side effects may I notice from receiving this  medicine? Side effects that you should report to your doctor or health care professional as soon as possible: -allergic reactions like skin rash, itching or hives, swelling of the face, lips, or tongue -low blood counts - this medicine may decrease the number of white blood cells, red blood cells and platelets. You may be at increased risk for infections and bleeding. -signs of infection - fever or chills, cough, sore throat, pain or difficulty passing urine -signs of decreased platelets or bleeding - bruising, pinpoint red spots on the skin, black, tarry stools, blood in the urine -signs of decreased red blood cells - unusually weak or tired, fainting spells, lightheadedness -breathing problems -confused, not responsive -chest pain -fast, irregular heartbeat -feeling faint or lightheaded, falls -mouth sores -redness, blistering, peeling or loosening of the skin, including inside the mouth -stomach pain -swelling of the ankles, feet, or hands -trouble passing urine or change in the amount of urine Side effects that usually do not require medical attention (report to your doctor or other health care professional if they continue or are bothersome): -anxiety -headache -loss of appetite -muscle aches -nausea -night sweats This list may not describe all possible side effects. Call your doctor for medical advice about side effects. You may report side effects to FDA at 1-800-FDA-1088. Where should I keep my medicine? This drug is given in a hospital or clinic and will not be stored at home. NOTE: This sheet is a summary. It may not cover all possible information. If  you have questions about this medicine, talk to your doctor, pharmacist, or health care provider.  2012, Elsevier/Gold Standard. (11/30/2007 2:04:59 PM)  Cyclopentolate eye solution  What is this medicine? CYCLOPENTOLATE (sye kloe PEN toe late) is used in the eye to widen your pupils, so they won't respond to light. It is  normally used for diagnostic procedures. This medicine may be used for other purposes; ask your health care provider or pharmacist if you have questions. What should I tell my health care provider before I take this medicine? They need to know if you have any of these conditions: -Down's Syndrome -glaucoma -wear contact lenses -an unusual or allergic reaction to cyclopentolate or other medications, foods, dyes or preservatives -pregnant or trying to become pregnant -breast-feeding How should I use this medicine? This medicine is only for use in the eye(s). Do not take by mouth. If used for something other than diagnostic procedures, follow the directions on the prescription label. Wash hands before and after use. Tilt the head back slightly and pull the lower eyelid down with the index finger to form a pouch. Squeeze a single drop into the pouch and close your eyes. Apply gentle pressure to the inside corner of your eye for 1 to 3 minutes. If you are using more than one drop, repeat this process with 5 minutes between the drops. Do not touch dropper tip to any surface to prevent contamination. If more than one eye product is being used, the products should be administered at least 5 minutes apart. Use your medicine at regular intervals. Do not use it more often than directed. Contact your pediatrician or health care professional regarding the use of this medication in children. Special care is needed. Overdosage: If you think you have taken too much of this medicine contact a poison control center or emergency room at once. NOTE: This medicine is only for you. Do not share this medicine with others. What if I miss a dose? If you miss a dose, use it as soon as you can. If it is almost time for the next dose, use only that dose. Do not use double or extra doses. What may interact with this medicine? -carbachol -medicines for Alzheimer's or Parkinson's disease -pilocarpine This list may not  describe all possible interactions. Give your health care provider a list of all the medicines, herbs, non-prescription drugs, or dietary supplements you use. Also tell them if you smoke, drink alcohol, or use illegal drugs. Some items may interact with your medicine. What should I watch for while using this medicine? Visit your doctor or health care professional for regular checks on your progress. Report any serious side effects right away. Stop using this medicine if your eyes become inflamed, painful or have a discharge, and see your doctor or health care professional right away. If you wear contact lenses, remove them before using this medicine. Contact lenses may be put back in 15 minutes after using this medicine. You may get drowsy or dizzy. Do not drive, use machinery, or do anything that needs mental alertness until you know how this drug affects you. Do not stand or sit up quickly, especially if you are an older patient. This reduces the risk of dizzy or fainting spells. Alcohol can make you more drowsy and dizzy. Avoid alcoholic drinks. Be careful not to touch the tip of the dispensing container onto the eye, or any other surface. Serious eye infections can result from contamination of eye solution. If this medicine makes  your eyes more sensitive to light, wear dark glasses In infants feeding intolerance may occur. Infant feedings should be held for 4 hours after administration of this medicine. Observe infants closely for at least 30 minutes following the use of this medicine. What side effects may I notice from receiving this medicine? Side effects that you should report to your doctor or health care professional as soon as possible: -allergic reactions like skin rash, itching or hives, swelling of the face, lips, or tongue -changes in mood or behavior -fast or irregular heartbeat -hallucination, loss of contact with reality (especially in children) -inflamed, swollen, painful, or  infected eyes or eyelids  Side effects that usually do not require medical attention (Report these to your doctor or health care professional if they continue or are bothersome.): -blurred vision -increased sensitivity to sunlight -temporary stinging or itching after application This list may not describe all possible side effects. Call your doctor for medical advice about side effects. You may report side effects to FDA at 1-800-FDA-1088. Where should I keep my medicine? Keep out of reach of children. Store at room temperature, 15 to 30 degrees C (59 to 86 degrees F). Keep the bottle tightly closed. Throw away any unused medicine after the expiration date. NOTE: This sheet is a summary. It may not cover all possible information. If you have questions about this medicine, talk to your doctor, pharmacist, or health care provider.  2012, Elsevier/Gold Standard. (06/15/2010 1:36:53 PM)

## 2011-12-12 NOTE — Progress Notes (Signed)
CC:   Elvina Sidle, M.D.  DIAGNOSIS:  Lymphoplasmacytic lymphoma.  CURRENT THERAPY:  The patient is status post 7 cycles of R-CVP.  INTERIM HISTORY:  Mr. Quant comes in for followup.  He is doing fairly well.  His weight is up.  He is not drinking.  He is not falling as much.  This is been a chronic issue for him, not related to his lymphoma.  His last blood studies showed his monoclonal spike to be 1.71 g/dL.  His IgG level was 2790 mg/dL.  His kappa light chain was 32.3 mg/dL.  He has had no pain.  He is sleeping okay.  He does need some Tylenol 3 at night to help with rest, mostly for some restless legs.  He has had no diarrhea.  He has had no constipation.  There is no headache.  PHYSICAL EXAMINATION:  This is a well-developed, well-nourished white gentleman in no obvious distress.  Vital Signs:  98.2, pulse 104, respiratory rate 20, blood pressure 104/57.  Weight is 151.  Head and neck:  Normocephalic, atraumatic skull.  There are no ocular or oral lesions.  There are no palpable cervical or supraclavicular lymph nodes. Lungs:  Clear bilaterally.  Cardiac:  Regular rate and rhythm with a normal S1 and S2.  There are no murmurs, rubs or bruits.  Abdomen:  Soft with good bowel sounds.  There is no palpable abdominal mass.  There is no fluid wave.  There is no palpable hepatosplenomegaly.  Axillary exam shows no bilateral axillary adenopathy.  Extremities:  No clubbing, cyanosis or edema.  LABORATORY STUDIES:  White cell count 5.9, hemoglobin 11.8, hematocrit 34.4, platelet count is 257.  White cell differential shows 45 segs, 27 lymphocytes, 20 monos.  IMPRESSION:  Mr. Sudberry is a 71 year old gentleman with lymphoplasmacytic lymphoma.  We did go ahead and repeat his scans back in early August.  His scans showed improving lymphadenopathy.  He had calcified splenic granulomas. There is no splenomegaly.  We will go ahead and finish up his treatments today.  I do want to  get a followup bone marrow biopsy on him.  I want to do this in October.  I suspect that Mr. Kindred probably will need some maintenance therapy.  I think this would be a good idea for him.  One would have to suspect that he has residual disease that we can keep in "remission" with maintenance Rituxan.  We will also plan for some followup scans on him to assess for response to his lymphadenopathy.  Again, will see Mr. Whalin back in a couple months.  I am so glad to see that he has done well.  His status has improved quite nicely ever since we first saw him.    ______________________________ Josph Macho, M.D. PRE/MEDQ  D:  12/11/2011  T:  12/12/2011  Job:  [id number]

## 2011-12-18 ENCOUNTER — Other Ambulatory Visit: Payer: Self-pay | Admitting: *Deleted

## 2012-01-08 ENCOUNTER — Encounter (HOSPITAL_COMMUNITY): Payer: Self-pay | Admitting: Pharmacy Technician

## 2012-01-24 ENCOUNTER — Other Ambulatory Visit: Payer: Self-pay | Admitting: *Deleted

## 2012-01-24 DIAGNOSIS — C880 Waldenstrom macroglobulinemia: Secondary | ICD-10-CM

## 2012-01-24 MED ORDER — SODIUM CHLORIDE 0.9 % IV SOLN: Freq: Once | INTRAVENOUS | Status: DC

## 2012-01-27 ENCOUNTER — Other Ambulatory Visit: Payer: Self-pay | Admitting: *Deleted

## 2012-01-27 DIAGNOSIS — C880 Waldenstrom macroglobulinemia: Secondary | ICD-10-CM

## 2012-01-27 NOTE — Progress Notes (Signed)
Orders needed for bone marrow biopsy and aspirate and dr. Myna Hidalgo not readily available to put orders in  For patient

## 2012-01-28 ENCOUNTER — Encounter (HOSPITAL_COMMUNITY): Payer: Self-pay

## 2012-01-28 ENCOUNTER — Ambulatory Visit (HOSPITAL_COMMUNITY)
Admission: RE | Admit: 2012-01-28 | Discharge: 2012-01-28 | Disposition: A | Discharge status: Home / Self Care | Payer: Medicare Other | Source: Ambulatory Visit | Attending: Hematology & Oncology | Admitting: Hematology & Oncology

## 2012-01-28 ENCOUNTER — Ambulatory Visit (HOSPITAL_BASED_OUTPATIENT_CLINIC_OR_DEPARTMENT_OTHER): Payer: Medicare Other | Admitting: Hematology & Oncology

## 2012-01-28 VITALS — BP 104/61 | HR 80 | Temp 97.9°F | Resp 24 | Ht 68.0 in | Wt 160.0 lb

## 2012-01-28 DIAGNOSIS — C8590 Non-Hodgkin lymphoma, unspecified, unspecified site: Secondary | ICD-10-CM

## 2012-01-28 DIAGNOSIS — D649 Anemia, unspecified: Secondary | ICD-10-CM | POA: Insufficient documentation

## 2012-01-28 DIAGNOSIS — C880 Waldenstrom macroglobulinemia: Secondary | ICD-10-CM

## 2012-01-28 DIAGNOSIS — C8589 Other specified types of non-Hodgkin lymphoma, extranodal and solid organ sites: Secondary | ICD-10-CM

## 2012-01-28 DIAGNOSIS — D72821 Monocytosis (symptomatic): Secondary | ICD-10-CM | POA: Insufficient documentation

## 2012-01-28 LAB — CBC WITH DIFFERENTIAL/PLATELET
Basophils Absolute: 0.1 10*3/uL (ref 0.0–0.1)
Basophils Relative: 1 % (ref 0–1)
Eosinophils Absolute: 0.3 10*3/uL (ref 0.0–0.7)
Eosinophils Relative: 4 % (ref 0–5)
Lymphs Abs: 1.5 10*3/uL (ref 0.7–4.0)
MCH: 32.4 pg (ref 26.0–34.0)
MCHC: 34.2 g/dL (ref 30.0–36.0)
MCV: 94.6 fL (ref 78.0–100.0)
Platelets: 195 10*3/uL (ref 150–400)
RDW: 13.2 % (ref 11.5–15.5)

## 2012-01-28 MED ORDER — MIDAZOLAM HCL 10 MG/2ML IJ SOLN
INTRAMUSCULAR | Status: AC
  Filled 2012-01-28: qty 2

## 2012-01-28 MED ORDER — SODIUM CHLORIDE 0.9 % IV SOLN
Freq: Once | INTRAVENOUS | Status: AC
  Administered 2012-01-28: 08:00:00 via INTRAVENOUS

## 2012-01-28 MED ORDER — MIDAZOLAM HCL 5 MG/5ML IJ SOLN
INTRAMUSCULAR | Status: AC | PRN
  Administered 2012-01-28 (×2): 1 mg via INTRAVENOUS

## 2012-01-28 MED ORDER — MEPERIDINE HCL 50 MG/ML IJ SOLN
INTRAMUSCULAR | Status: AC
  Filled 2012-01-28: qty 1

## 2012-01-28 MED ORDER — MEPERIDINE HCL 25 MG/ML IJ SOLN
INTRAMUSCULAR | Status: AC | PRN
  Administered 2012-01-28 (×2): 12.5 mg via INTRAVENOUS

## 2012-01-28 NOTE — ED Notes (Signed)
Patient denies pain and is resting comfortably.  

## 2012-01-28 NOTE — ED Notes (Signed)
Ambulated in room with minimal assist and tolerated this well. Dressing remains CDI 

## 2012-01-28 NOTE — ED Notes (Signed)
Family updated as to patient's status.

## 2012-01-28 NOTE — Sedation Documentation (Signed)
Medication dose calculated and verified for:Demerol 25 mg IV,Versed 2 mg IV 

## 2012-01-28 NOTE — ED Notes (Signed)
Dressing on post iliac area CDI 

## 2012-01-28 NOTE — Progress Notes (Signed)
This is a procedure note for [employer]. This is a note regarding his bone marrow biopsy and aspirate.  [name] [name]. was brought to the short stay unit at [facility]. We do the appropriate timeout procedure. His Mallimpati score is 1.  His ASA Class is 1.  Is present to his right side. He he had an IV placed peripherally. We used Versed at 2.5 mg and Demerol at 25 mg for sedation.  The left posterior iliac crest region was prepped and draped in sterile fashion. 8 cc of 2% lidocaine was infiltrated under the skin down to the periosteum.  A #11 scalpel was used to make an incision into the skin. We obtained 2 bone marrow aspirates without difficulty.  We then made another incision into the skin. An excellent bone marrow biopsy core was obtained.  The patient tolerated the procedure well. There were no complications. The incision site was dressed sterilely.  Is a procedure note for [employer].  [name] E.  2 Chronicles 7:14

## 2012-01-28 NOTE — ED Notes (Signed)
HOB 45 degrees and pt enjoying coffee voices no c/o

## 2012-01-28 NOTE — ED Notes (Addendum)
Dressing to left post iliac area hypafix and gauze. Pt placed supine with towel site for pressure Procedure ends

## 2012-01-28 NOTE — ED Notes (Signed)
Patient is resting comfortably. 

## 2012-01-30 ENCOUNTER — Other Ambulatory Visit: Payer: Self-pay | Admitting: Hematology & Oncology

## 2012-01-30 DIAGNOSIS — C880 Waldenstrom macroglobulinemia: Secondary | ICD-10-CM

## 2012-01-30 MED ORDER — ACETAMINOPHEN-CODEINE #3 300-30 MG PO TABS: 1.0000 | ORAL_TABLET | Freq: Four times a day (QID) | ORAL | Status: DC | PRN

## 2012-02-05 ENCOUNTER — Ambulatory Visit (HOSPITAL_BASED_OUTPATIENT_CLINIC_OR_DEPARTMENT_OTHER)
Admission: RE | Admit: 2012-02-05 | Discharge: 2012-02-05 | Disposition: A | Discharge status: Home / Self Care | Payer: Medicare Other | Source: Ambulatory Visit | Attending: Hematology & Oncology | Admitting: Hematology & Oncology

## 2012-02-05 ENCOUNTER — Other Ambulatory Visit: Payer: Self-pay | Admitting: Hematology & Oncology

## 2012-02-05 ENCOUNTER — Ambulatory Visit: Payer: Medicare Other | Admitting: Lab

## 2012-02-05 DIAGNOSIS — C8589 Other specified types of non-Hodgkin lymphoma, extranodal and solid organ sites: Secondary | ICD-10-CM

## 2012-02-05 DIAGNOSIS — C880 Waldenstrom macroglobulinemia: Secondary | ICD-10-CM

## 2012-02-05 DIAGNOSIS — N400 Enlarged prostate without lower urinary tract symptoms: Secondary | ICD-10-CM | POA: Insufficient documentation

## 2012-02-05 DIAGNOSIS — I719 Aortic aneurysm of unspecified site, without rupture: Secondary | ICD-10-CM | POA: Insufficient documentation

## 2012-02-05 DIAGNOSIS — Z09 Encounter for follow-up examination after completed treatment for conditions other than malignant neoplasm: Secondary | ICD-10-CM | POA: Insufficient documentation

## 2012-02-05 DIAGNOSIS — I2510 Atherosclerotic heart disease of native coronary artery without angina pectoris: Secondary | ICD-10-CM | POA: Insufficient documentation

## 2012-02-05 LAB — CMP (CANCER CENTER ONLY)
AST: 14 U/L (ref 11–38)
Alkaline Phosphatase: 60 U/L (ref 26–84)
Glucose, Bld: 102 mg/dL (ref 73–118)
Sodium: 142 mEq/L (ref 128–145)
Total Bilirubin: 0.5 mg/dl (ref 0.20–1.60)
Total Protein: 7.5 g/dL (ref 6.4–8.1)

## 2012-02-05 MED ORDER — IOHEXOL 300 MG/ML  SOLN
100.0000 mL | Freq: Once | INTRAMUSCULAR | Status: AC | PRN
  Administered 2012-02-05: 100 mL via INTRAVENOUS

## 2012-02-10 ENCOUNTER — Telehealth: Payer: Self-pay | Admitting: Oncology

## 2012-02-10 NOTE — Telephone Encounter (Addendum)
Message copied by Lacie Draft on Mon Feb 10, 2012  4:20 PM ------      Message from: Josph Macho      Created: Sun Feb 09, 2012  4:50 PM       Call his wife - tell her that the lymph nodes have not grown any.  Cindee Lame  02/10/2012 Spoke with Mrs Probert regarding scan, lymph nodes not grown. Teola Bradley, Taylon Coole [employer]

## 2012-02-24 ENCOUNTER — Other Ambulatory Visit: Payer: Self-pay | Admitting: Hematology & Oncology

## 2012-02-25 ENCOUNTER — Other Ambulatory Visit: Payer: Self-pay | Admitting: Hematology & Oncology

## 2012-02-25 DIAGNOSIS — C880 Waldenstrom macroglobulinemia: Secondary | ICD-10-CM

## 2012-02-28 NOTE — Telephone Encounter (Signed)
error 

## 2012-03-09 ENCOUNTER — Ambulatory Visit (HOSPITAL_BASED_OUTPATIENT_CLINIC_OR_DEPARTMENT_OTHER): Payer: Medicare Other | Admitting: Hematology & Oncology

## 2012-03-09 ENCOUNTER — Other Ambulatory Visit (HOSPITAL_BASED_OUTPATIENT_CLINIC_OR_DEPARTMENT_OTHER): Payer: Medicare Other | Admitting: Lab

## 2012-03-09 VITALS — BP 110/64 | HR 87 | Temp 98.0°F | Resp 18 | Ht 68.0 in | Wt 164.0 lb

## 2012-03-09 DIAGNOSIS — C880 Waldenstrom macroglobulinemia: Secondary | ICD-10-CM

## 2012-03-09 DIAGNOSIS — C8589 Other specified types of non-Hodgkin lymphoma, extranodal and solid organ sites: Secondary | ICD-10-CM

## 2012-03-09 LAB — CBC WITH DIFFERENTIAL (CANCER CENTER ONLY)
BASO#: 0.1 10*3/uL (ref 0.0–0.2)
Eosinophils Absolute: 0.1 10*3/uL (ref 0.0–0.5)
LYMPH%: 17.1 % (ref 14.0–48.0)
MCH: 32.7 pg (ref 28.0–33.4)
MCV: 96 fL (ref 82–98)
MONO%: 14.6 % — ABNORMAL HIGH (ref 0.0–13.0)
Platelets: 183 10*3/uL (ref 145–400)
RBC: 4.34 10*6/uL (ref 4.20–5.70)

## 2012-03-09 NOTE — Progress Notes (Signed)
This office note has been dictated.

## 2012-03-10 NOTE — Progress Notes (Signed)
CC:   [name] [name], M.D.  DIAGNOSIS:  Lymphoplasmacytic lymphoma.  CURRENT THERAPY:  Observation.  INTERIM HISTORY:  [name] [name] comes in for his followup.  We treated him with chemotherapy.  He had 8 cycles of R-CVP.  We did a repeat bone marrow on him in October.  The bone marrow report ([id number]) showed persistent involvement with non-Hodgkin's lymphoma. It was however less involved.  We are following his monoclonal studies.  His last monoclonal spike was 1.71 g/dL.  His IgM level was 2790 mg/dL.  Kappa light chain was 32.3 mg/dL.  He is feeling better.  He looks better.  His weight is up.  He is more active.  We also repeated his scans.  His last CT scan was done back on October 23.  The CT scan showed some mediastinal adenopathy which was stable. He has some stable retroperitoneal lymph nodes.  Liver was unremarkable. There was no splenomegaly.  Again, he is asymptomatic right now.  He is enjoying his quality of life.  His appetite has been good.  There is no cough.  He has had no problem with bowels or bladder.  PHYSICAL EXAMINATION:  General:  This is a well-developed, well- nourished white gentleman in no obvious distress.  Vital signs:  Show temperature of 98, pulse 87, respiratory rate 18, blood pressure 110/64. Weight is 164.  Head and neck:  Shows a normocephalic, atraumatic skull. There are no ocular or oral lesions.  There are no palpable cervical or supraclavicular lymph nodes.  Lungs:  Clear bilaterally.  Cardiac: Regular rate and rhythm with normal S1, S2.  There are no murmurs, rubs or bruits.  Abdomen:  Soft with good bowel sounds.  There is no palpable abdominal mass.  There is no palpable hepatosplenomegaly.  Axillary: Shows no bilateral axillary adenopathy.  Extremities:  Shows no clubbing, cyanosis or edema.  Neurological:  Shows no focal neurological deficit.  LABORATORY STUDIES:  White cell count is 8, hemoglobin 14.2, hematocrit 41.6, platelet  count 183.  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  He has had 8 cycles of chemotherapy with capital R-CVP.  He completed this I think back in September.  I think we can watch him right now.  I am sure that we will probably have to treat him in the future depending on how his blood counts go.  We will plan to get him back in 3 months' time now.  I do not see any need to do any scans on him.    ______________________________ [name] [name], M.D. PRE/MEDQ  D:  03/09/2012  T:  03/10/2012  Job:  [id number]

## 2012-03-13 LAB — COMPREHENSIVE METABOLIC PANEL
Alkaline Phosphatase: 62 U/L (ref 39–117)
BUN: 9 mg/dL (ref 6–23)
Glucose, Bld: 82 mg/dL (ref 70–99)
Sodium: 139 mEq/L (ref 135–145)
Total Bilirubin: 0.5 mg/dL (ref 0.3–1.2)

## 2012-03-13 LAB — KAPPA/LAMBDA LIGHT CHAINS
Kappa:Lambda Ratio: 199.29 — ABNORMAL HIGH (ref 0.26–1.65)
Lambda Free Lght Chn: 0.14 mg/dL — ABNORMAL LOW (ref 0.57–2.63)

## 2012-03-13 LAB — IGG, IGA, IGM
IgA: 8 mg/dL — ABNORMAL LOW (ref 68–379)
IgG (Immunoglobin G), Serum: 335 mg/dL — ABNORMAL LOW (ref 650–1600)
IgM, Serum: 1950 mg/dL — ABNORMAL HIGH (ref 41–251)

## 2012-03-13 LAB — PROTEIN ELECTROPHORESIS, SERUM, WITH REFLEX
Gamma Globulin: 3.7 % — ABNORMAL LOW (ref 11.1–18.8)
M-Spike, %: 1.33 g/dL

## 2012-03-13 LAB — IFE INTERPRETATION

## 2012-05-15 ENCOUNTER — Other Ambulatory Visit: Payer: Self-pay | Admitting: Dermatology

## 2012-05-30 ENCOUNTER — Other Ambulatory Visit: Payer: Self-pay

## 2012-06-08 ENCOUNTER — Ambulatory Visit (HOSPITAL_BASED_OUTPATIENT_CLINIC_OR_DEPARTMENT_OTHER): Payer: Medicare Other | Admitting: Hematology & Oncology

## 2012-06-08 ENCOUNTER — Ambulatory Visit (HOSPITAL_BASED_OUTPATIENT_CLINIC_OR_DEPARTMENT_OTHER): Payer: Medicare Other | Admitting: Lab

## 2012-06-08 VITALS — BP 122/77 | HR 80 | Temp 97.7°F | Resp 18 | Ht 68.0 in | Wt 169.0 lb

## 2012-06-08 DIAGNOSIS — C8589 Other specified types of non-Hodgkin lymphoma, extranodal and solid organ sites: Secondary | ICD-10-CM

## 2012-06-08 DIAGNOSIS — C880 Waldenstrom macroglobulinemia: Secondary | ICD-10-CM

## 2012-06-08 LAB — CBC WITH DIFFERENTIAL (CANCER CENTER ONLY)
BASO%: 0.5 % (ref 0.0–2.0)
EOS%: 0.8 % (ref 0.0–7.0)
HCT: 42.5 % (ref 38.7–49.9)
LYMPH#: 1.3 10*3/uL (ref 0.9–3.3)
MCHC: 34.4 g/dL (ref 32.0–35.9)
NEUT#: 5.2 10*3/uL (ref 1.5–6.5)
NEUT%: 67.9 % (ref 40.0–80.0)
RDW: 12.2 % (ref 11.1–15.7)

## 2012-06-08 NOTE — Progress Notes (Signed)
This office note has been dictated.

## 2012-06-09 NOTE — Progress Notes (Signed)
CC:   [name] [name], M.D.  DIAGNOSIS:  Lymphoplasmacytic lymphoma.  CURRENT THERAPY:  Observation.  INTERIM HISTORY:  [name] [name] comes in for followup.  We last saw him back in November.  He got through the holidays okay.  He got through the snow storm we had a couple of weeks ago.  We are watching his monoclonal studies.  Back in November, his monoclonal spike was 1.33 g/dL.  IgM level was 1950 mg/dL.  Kappa light chain was 28 mg/dL.  These are all improved.  He feels well.  I do not think he is drinking.  He has not fallen.  He did take some physical therapy for his legs.  He has had no cough.  He has had no mouth sores.  He has had no rashes.  Overall, his performance status is ECOG 2.  PHYSICAL EXAMINATION:  General:  This is a fairly well-developed, well- nourished white gentleman in no obvious distress.  Vital signs: Temperature of 97.7, pulse 80, respiratory rate 18, blood pressure 122/77.  Weight is 169.  Head and neck:  Normocephalic, atraumatic skull.  There are no ocular or oral lesions.  There are no palpable cervical or supraclavicular lymph nodes.  Lungs:  Clear bilaterally. Cardiac:  Regular rate and rhythm with a normal S1 and S2.  There are no murmurs, rubs, or bruits.  Abdomen:  Soft with good bowel sounds.  There is no palpable abdominal mass.  There is no palpable hepatosplenomegaly. Extremities:  No clubbing, cyanosis, or edema.  Neurological:  No focal neurological deficits.  LABORATORY STUDIES:  White cell count is 7.6, hemoglobin 14.6, hematocrit 42.5, platelet count 175.  White cell differential shows 68 segs, 17 lymphs, 13 monos.  IMPRESSION:  [name] [name] is a 72 year old gentleman with a history of lymphoplasmacytic lymphoma.  He is doing quite well.  He did take treatment with 8 cycles of R-CVP.  He completed this back in September 2013.  We will continue to watch him.  He is totally asymptomatic right now.  I do not see any need for any  scans.  We will plan to get him back in another 3 months.   ______________________________ [name] [name], M.D. PRE/MEDQ  D:  06/08/2012  T:  06/09/2012  Job:  [id number]

## 2012-06-10 ENCOUNTER — Telehealth: Payer: Self-pay | Admitting: *Deleted

## 2012-06-10 LAB — PROTEIN ELECTROPHORESIS, SERUM, WITH REFLEX
Alpha-1-Globulin: 5.1 % — ABNORMAL HIGH (ref 2.9–4.9)
Gamma Globulin: 4 % — ABNORMAL LOW (ref 11.1–18.8)
M-Spike, %: 1.39 g/dL

## 2012-06-10 LAB — KAPPA/LAMBDA LIGHT CHAINS
Kappa free light chain: 16.9 mg/dL — ABNORMAL HIGH (ref 0.33–1.94)
Kappa:Lambda Ratio: 112.67 — ABNORMAL HIGH (ref 0.26–1.65)
Lambda Free Lght Chn: 0.15 mg/dL — ABNORMAL LOW (ref 0.57–2.63)

## 2012-06-10 LAB — COMPREHENSIVE METABOLIC PANEL
AST: 10 U/L (ref 0–37)
Albumin: 3.7 g/dL (ref 3.5–5.2)
Alkaline Phosphatase: 67 U/L (ref 39–117)
BUN: 9 mg/dL (ref 6–23)
Potassium: 4.3 mEq/L (ref 3.5–5.3)
Total Bilirubin: 0.8 mg/dL (ref 0.3–1.2)

## 2012-06-10 LAB — IFE INTERPRETATION

## 2012-06-10 LAB — LACTATE DEHYDROGENASE: LDH: 64 U/L — ABNORMAL LOW (ref 94–250)

## 2012-06-10 LAB — IGG, IGA, IGM: IgM, Serum: 1830 mg/dL — ABNORMAL HIGH (ref 41–251)

## 2012-06-10 NOTE — Telephone Encounter (Signed)
Called patients wife to let her know that patients labs are ok per dr. Myna Hidalgo

## 2012-06-10 NOTE — Telephone Encounter (Signed)
Message copied by Anselm Jungling on Wed Jun 10, 2012 11:53 AM ------      Message from: Josph Macho      Created: Tue Jun 09, 2012  8:33 PM       Call his wife - labs continue to improve!!!   Pete ------

## 2012-08-31 ENCOUNTER — Ambulatory Visit (HOSPITAL_BASED_OUTPATIENT_CLINIC_OR_DEPARTMENT_OTHER): Payer: Medicare Other | Admitting: Medical

## 2012-08-31 ENCOUNTER — Ambulatory Visit (HOSPITAL_BASED_OUTPATIENT_CLINIC_OR_DEPARTMENT_OTHER): Payer: Medicare Other | Admitting: Lab

## 2012-08-31 VITALS — BP 108/64 | HR 87 | Temp 98.0°F | Resp 18 | Ht 68.0 in | Wt 172.0 lb

## 2012-08-31 DIAGNOSIS — C880 Waldenstrom macroglobulinemia: Secondary | ICD-10-CM

## 2012-08-31 DIAGNOSIS — Z8572 Personal history of non-Hodgkin lymphomas: Secondary | ICD-10-CM

## 2012-08-31 LAB — CBC WITH DIFFERENTIAL (CANCER CENTER ONLY)
BASO%: 0.6 % (ref 0.0–2.0)
HCT: 42.2 % (ref 38.7–49.9)
LYMPH#: 1.4 10*3/uL (ref 0.9–3.3)
MONO#: 1.4 10*3/uL — ABNORMAL HIGH (ref 0.1–0.9)
NEUT#: 6 10*3/uL (ref 1.5–6.5)
Platelets: 191 10*3/uL (ref 145–400)
RDW: 12.4 % (ref 11.1–15.7)
WBC: 8.9 10*3/uL (ref 4.0–10.0)

## 2012-08-31 LAB — CMP (CANCER CENTER ONLY)
Albumin: 3.1 g/dL — ABNORMAL LOW (ref 3.3–5.5)
Alkaline Phosphatase: 64 U/L (ref 26–84)
BUN, Bld: 9 mg/dL (ref 7–22)
CO2: 29 mEq/L (ref 18–33)
Glucose, Bld: 108 mg/dL (ref 73–118)
Total Bilirubin: 0.6 mg/dl (ref 0.20–1.60)

## 2012-08-31 MED ORDER — ACETAMINOPHEN-CODEINE #3 300-30 MG PO TABS: 1.0000 | ORAL_TABLET | Freq: Four times a day (QID) | ORAL | Status: DC | PRN

## 2012-08-31 NOTE — Progress Notes (Signed)
DIAGNOSIS:  Lymphoplasmacytic lymphoma.  CURRENT THERAPY:  Observation.  INTERIM HISTORY: [name] [name] presents today for an office followup visit.  Overall he reports that he is doing quite well.  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  He still continues to complain of some chronic weakness in his legs.  It has been mentioned to him several times to receive physical therapy.  He is not reporting any type of mouth sores or rashes he is not reporting any cough.  He is able to perform his activities of daily living without any hindrance or decline.  His last monoclonal studies back in February revealed an IgM of 1830 mg/dL, kappa free light chain 16.9 mg/dL and an M spike of 1.61 g/dL.  Overall his studies are holding steady.  He reports a good appetite he denies any unintentional weight loss.  He denies any nausea, vomiting, diarrhea or constipation.  He denies any fevers, chills, or night sweats.  He denies any cough, shortness of breath, or chest pain.  He denies any abdominal pain or obvious or abnormal bleeding.  He denies any headaches, visual changes or rashes.  He denies any palpable lymph nodes.  He denies any excessive fatigue.  Review of Systems: Constitutional:Negative for malaise/fatigue, fever, chills, weight loss, diaphoresis, activity change, appetite change, and unexpected weight change.  HEENT: Negative for double vision, blurred vision, visual loss, ear pain, tinnitus, congestion, rhinorrhea, epistaxis sore throat or sinus disease, oral pain/lesion, tongue soreness Respiratory: Negative for cough, chest tightness, shortness of breath, wheezing and stridor.  Cardiovascular: Negative for chest pain, palpitations, leg swelling, orthopnea, PND, DOE or claudication Gastrointestinal: Negative for nausea, vomiting, abdominal pain, diarrhea, constipation, blood in stool, melena, hematochezia, abdominal distention, anal bleeding, rectal pain, anorexia and hematemesis.   Genitourinary: Negative for dysuria, frequency, hematuria,  Musculoskeletal: Negative for myalgias, back pain, joint swelling, arthralgias and gait problem.  Skin: Negative for rash, color change, pallor and wound.  Neurological:. Negative for dizziness/light-headedness, tremors, seizures, syncope, facial asymmetry, speech difficulty, weakness, numbness, headaches and paresthesias.  Hematological: Negative for adenopathy. Does not bruise/bleed easily.  Psychiatric/Behavioral:  Negative for depression, no loss of interest in normal activity or change in sleep pattern.   Physical Exam: This is a 72 year old fairly well-developed well-nourished white gentleman in no obvious distress Vitals: Temperature 98.2 degrees pulse 87 respirations 18 blood pressure 108/64 weight 172 pounds HEENT reveals a normocephalic, atraumatic skull, no scleral icterus, no oral lesions  Neck is supple without any cervical or supraclavicular adenopathy.  Lungs are clear to auscultation bilaterally. There are no wheezes, rales or rhonci Cardiac is regular rate and rhythm with a normal S1 and S2. There are no murmurs, rubs, or bruits.  Abdomen is soft with good bowel sounds, there is no palpable mass. There is no palpable hepatosplenomegaly. There is no palpable fluid wave.  Musculoskeletal no tenderness of the spine, ribs, or hips.  Extremities there are no clubbing, cyanosis, or edema.  Skin no petechia, purpura or ecchymosis Neurologic is nonfocal.  Laboratory Data: White count 8.9 hemoglobin 14.4 hematocrit 42.2 platelets 191,000  Current Outpatient Prescriptions on File Prior to Visit  Medication Sig Dispense Refill  . acetaminophen (TYLENOL) 325 MG tablet Take 650 mg by mouth every 6 (six) hours as needed. Pain      . acetaminophen-codeine (TYLENOL #3) 300-30 MG per tablet Take 1-2 tablets by mouth every 6 (six) hours as needed for pain.  90 tablet  2  . diphenhydrAMINE (BENADRYL) 25  MG tablet Take 25 mg by  mouth every 6 (six) hours as needed. allergies      . fish oil-omega-3 fatty acids 1000 MG capsule Take 1 g by mouth daily.       Marland Kitchen LORazepam (ATIVAN) 0.5 MG tablet 1-2 tabs PO/SL every 8 hours as needed for anxiety, sleep, or nausea.  60 tablet  2   No current facility-administered medications on file prior to visit.   Assessment/Plan: This is a pleasant 72 year old white gentleman following issues:  #1.  History of lymphoplasmacytic lymphoma.  He did have treatment with 8 cycles of R.-CVP.  He completed this back in September 2013.  He remains asymptomatic.  We will continue to monitor him every 3 months.  There is no indication for any scans at this time.  #2.  Followup.  We will follow back up with [name] in 3 months but before then should there be questions or concerns.

## 2012-09-02 LAB — IGG, IGA, IGM
IgA: 8 mg/dL — ABNORMAL LOW (ref 68–379)
IgG (Immunoglobin G), Serum: 311 mg/dL — ABNORMAL LOW (ref 650–1600)
IgM, Serum: 1860 mg/dL — ABNORMAL HIGH (ref 41–251)

## 2012-09-02 LAB — PROTEIN ELECTROPHORESIS, SERUM, WITH REFLEX
Alpha-2-Globulin: 11.5 % (ref 7.1–11.8)
Beta 2: 21.9 % — ABNORMAL HIGH (ref 3.2–6.5)
Beta Globulin: 6 % (ref 4.7–7.2)
M-Spike, %: 1.42 g/dL

## 2012-09-02 LAB — KAPPA/LAMBDA LIGHT CHAINS: Kappa free light chain: 19.7 mg/dL — ABNORMAL HIGH (ref 0.33–1.94)

## 2012-11-18 ENCOUNTER — Other Ambulatory Visit: Payer: Self-pay

## 2012-11-30 ENCOUNTER — Ambulatory Visit (HOSPITAL_BASED_OUTPATIENT_CLINIC_OR_DEPARTMENT_OTHER): Payer: Medicare Other | Admitting: Lab

## 2012-11-30 ENCOUNTER — Ambulatory Visit (HOSPITAL_BASED_OUTPATIENT_CLINIC_OR_DEPARTMENT_OTHER): Payer: Medicare Other | Admitting: Hematology & Oncology

## 2012-11-30 VITALS — BP 113/64 | HR 88 | Temp 98.4°F | Resp 18 | Ht 68.0 in | Wt 172.0 lb

## 2012-11-30 DIAGNOSIS — C8599 Non-Hodgkin lymphoma, unspecified, extranodal and solid organ sites: Secondary | ICD-10-CM

## 2012-11-30 DIAGNOSIS — G8929 Other chronic pain: Secondary | ICD-10-CM

## 2012-11-30 DIAGNOSIS — C880 Waldenstrom macroglobulinemia: Secondary | ICD-10-CM

## 2012-11-30 LAB — CBC WITH DIFFERENTIAL (CANCER CENTER ONLY)
BASO#: 0 10*3/uL (ref 0.0–0.2)
Eosinophils Absolute: 0.1 10*3/uL (ref 0.0–0.5)
HGB: 13.8 g/dL (ref 13.0–17.1)
LYMPH%: 16.7 % (ref 14.0–48.0)
MCH: 32.3 pg (ref 28.0–33.4)
MCV: 95 fL (ref 82–98)
MONO%: 15.3 % — ABNORMAL HIGH (ref 0.0–13.0)
NEUT%: 67 % (ref 40.0–80.0)
RBC: 4.27 10*6/uL (ref 4.20–5.70)

## 2012-11-30 MED ORDER — ACETAMINOPHEN-CODEINE #3 300-30 MG PO TABS: 1.0000 | ORAL_TABLET | Freq: Four times a day (QID) | ORAL | Status: DC | PRN

## 2012-11-30 NOTE — Progress Notes (Signed)
This office note has been dictated.

## 2012-12-01 NOTE — Progress Notes (Signed)
CC:   [name] [name], M.D.  DIAGNOSIS:  Lymphoplasmacytic lymphoma.  CURRENT THERAPY:  Observation.  INTERIM HISTORY:  [name] [name] comes in for followup.  We last saw him back in May.  He has had a good summer so far.  He has been working in his garden.  So far this has been fairly productive for him.  When we last saw him, his monoclonal spike was 1.42 g/dL.  His IgM was 1860 mg/dL.  Kappa light chain was 19.7 mg/dL.  He has chronic leg pain.  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  He has had no problems with bowels or bladder.  He has had no fevers, sweats or chills.  He has had no cough or shortness of breath.  He said he is not drinking, which certainly is a "plus" for him.  PHYSICAL EXAMINATION:  General:  This is a fairly well-developed, well- nourished white gentleman in no obvious distress.  Vital signs: Temperature of 98.4, pulse 88, respiratory rate 18, blood pressure 113/64.  Weight is 172.  Head and neck:  Normocephalic, atraumatic skull.  There are no ocular or oral lesions.  There are no palpable cervical or supraclavicular lymph nodes.  Lungs:  Clear bilaterally. Cardiac:  Regular rate and rhythm with a normal S1 and S2.  There are no murmurs, rubs or bruits.  Abdomen:  Soft.  He has good bowel sounds. There is no fluid wave.  There is no palpable hepatosplenomegaly. Extremities:  No clubbing, cyanosis or edema.  Neurological:  No focal neurological deficits.  LABORATORY STUDIES:  White cell count is 9.5, hemoglobin 13.8, hematocrit 40.5, platelet count 196.  IMPRESSION:  [name] [name] is a nice 72 year old gentleman with lymphoplasmacytic lymphoma.  He had chemotherapy with R-CVP.  The 8 cycles were completed in September 2013.  Again, I do not see any clinical evidence of recurrent disease.  For [name] [name], I think this would be manifested by anemia.  We will go ahead and get him back in another 4 months.  I did refill his Tylenol No. 3  prescription.   ______________________________ [name] [name], M.D. PRE/MEDQ  D:  11/30/2012  T:  12/01/2012  Job:  [id number]

## 2012-12-02 LAB — KAPPA/LAMBDA LIGHT CHAINS
Kappa free light chain: 24.7 mg/dL — ABNORMAL HIGH (ref 0.33–1.94)
Lambda Free Lght Chn: 0.16 mg/dL — ABNORMAL LOW (ref 0.57–2.63)

## 2012-12-02 LAB — PROTEIN ELECTROPHORESIS, SERUM, WITH REFLEX
Beta 2: 22.6 % — ABNORMAL HIGH (ref 3.2–6.5)
Beta Globulin: 6.2 % (ref 4.7–7.2)
Gamma Globulin: 3.5 % — ABNORMAL LOW (ref 11.1–18.8)
M-Spike, %: 1.4 g/dL

## 2012-12-02 LAB — COMPREHENSIVE METABOLIC PANEL
CO2: 25 mEq/L (ref 19–32)
Creatinine, Ser: 0.68 mg/dL (ref 0.50–1.35)
Glucose, Bld: 87 mg/dL (ref 70–99)
Total Bilirubin: 0.4 mg/dL (ref 0.3–1.2)
Total Protein: 6.6 g/dL (ref 6.0–8.3)

## 2013-01-05 ENCOUNTER — Other Ambulatory Visit: Payer: Self-pay | Admitting: Dermatology

## 2013-02-18 ENCOUNTER — Other Ambulatory Visit: Payer: Self-pay

## 2013-04-01 ENCOUNTER — Ambulatory Visit (HOSPITAL_BASED_OUTPATIENT_CLINIC_OR_DEPARTMENT_OTHER): Payer: Medicare Other | Admitting: Lab

## 2013-04-01 ENCOUNTER — Telehealth: Payer: Self-pay | Admitting: Hematology & Oncology

## 2013-04-01 ENCOUNTER — Ambulatory Visit (HOSPITAL_BASED_OUTPATIENT_CLINIC_OR_DEPARTMENT_OTHER): Payer: Medicare Other | Admitting: Hematology & Oncology

## 2013-04-01 VITALS — BP 114/54 | HR 88 | Temp 98.1°F | Resp 18 | Ht 68.0 in | Wt 170.0 lb

## 2013-04-01 DIAGNOSIS — Z125 Encounter for screening for malignant neoplasm of prostate: Secondary | ICD-10-CM

## 2013-04-01 DIAGNOSIS — G893 Neoplasm related pain (acute) (chronic): Secondary | ICD-10-CM

## 2013-04-01 DIAGNOSIS — D509 Iron deficiency anemia, unspecified: Secondary | ICD-10-CM

## 2013-04-01 DIAGNOSIS — C880 Waldenstrom macroglobulinemia: Secondary | ICD-10-CM

## 2013-04-01 DIAGNOSIS — Z87898 Personal history of other specified conditions: Secondary | ICD-10-CM

## 2013-04-01 LAB — CBC WITH DIFFERENTIAL (CANCER CENTER ONLY)
BASO#: 0 10*3/uL (ref 0.0–0.2)
EOS%: 0.7 % (ref 0.0–7.0)
Eosinophils Absolute: 0.1 10*3/uL (ref 0.0–0.5)
HCT: 39.3 % (ref 38.7–49.9)
HGB: 12.9 g/dL — ABNORMAL LOW (ref 13.0–17.1)
MCH: 30.2 pg (ref 28.0–33.4)
MCHC: 32.8 g/dL (ref 32.0–35.9)
MONO%: 18.9 % — ABNORMAL HIGH (ref 0.0–13.0)
NEUT#: 5.8 10*3/uL (ref 1.5–6.5)
NEUT%: 67.5 % (ref 40.0–80.0)
RBC: 4.27 10*6/uL (ref 4.20–5.70)

## 2013-04-01 MED ORDER — TRAMADOL HCL 50 MG PO TABS: 50.0000 mg | ORAL_TABLET | Freq: Four times a day (QID) | ORAL | Status: DC | PRN

## 2013-04-01 NOTE — Telephone Encounter (Signed)
Left message with 06-30-13 appointment and mailed calender

## 2013-04-01 NOTE — Progress Notes (Signed)
This office note has been dictated.

## 2013-04-02 ENCOUNTER — Encounter: Payer: Self-pay | Admitting: *Deleted

## 2013-04-02 NOTE — Progress Notes (Signed)
CC:   Elvina Sidle, M.D.  DIAGNOSIS:  Lymphoplasmacytic lymphoma.  CURRENT THERAPY:  Observation.  INTERIM HISTORY:  Mr. Kucinski comes in for followup.  We last saw him back in August.  He is doing okay.  Unfortunately, he is still drinking quite a bit.  He drinks beer.  His ex-wife, who comes with him, says that he probably drinks six packs or so a day.  When we last saw him in August, his monoclonal spike was 1.4 g/dL.  IgM level was 2030 mg/dL.  Kappa light chain was 24.7 mg/dL.  He has had no lymphadenopathy issues.  He has had no weight loss.  He has had no cough or shortness of breath.  He still has bony pain.  I do not think this is anything related to his underlying lymphoplasmacytic lymphoma.  I think this is more of an alcohol-related effect.  I told him that if he keeps drinking, he will clearly develop cirrhosis.  He takes Tylenol No. 3.  I am going to stop this because of his drinking.  He does have urinary frequency.  We will get a PSA on him.  He really does not see his family doctor.  He does not like to go to see doctors.  PHYSICAL EXAMINATION:  General:  This is a thin white gentleman, in no obvious distress.  Vital Signs:  Show temperature 98.1, pulse 88, respiratory rate 18, blood pressure 114/54, weight is 170 pounds.  Head and Neck Exam:  Shows a normocephalic, atraumatic skull.  There is no scleral icterus.  He has no oral lesions.  He has no adenopathy in the neck.  Lungs:  Clear bilaterally.  Cardiac Exam:  Regular rate and rhythm with a normal S1 and S2.  There are no murmurs, rubs, or bruits. Abdomen:  Soft.  He has good bowel sounds.  There is no fluid wave. There is no palpable hepatosplenomegaly.  Axillary Exam:  Shows no bilateral axillary adenopathy.  Extremities:  Show no clubbing, cyanosis, or edema.  He has some age-related osteoarthritic changes. There is good muscle strength in upper and lower extremities.  There may be slight tenderness  to palpation of his bones.  Neurological Exam: Shows no focal neurological deficits.  Skin Exam:  No rashes, ecchymoses, or petechia.  LABORATORY STUDIES:  White cell count is 8.6, hemoglobin 12.9, hematocrit 39.3, platelet count 203.  MCV is 92.  IMPRESSION:  Mr. Cheese is a 72 year old gentleman with history of lymphoplasmacytic lymphoma.  We treated this with R-CVP.  He completed 8 cycles in September 2013.  I noted that why his hemoglobin is going down.  Again, this may be an alcohol-related effect.  He has had problems with iron deficiency in the past.  I looked at his blood smear.  He had some anisocytosis and poikilocytosis.  Again, this may be reflective of iron deficiency.  We will check his iron levels today.  Overall, he is holding this.  I do not see any evidence of recurrent lymphoma.  I am more worried about him and the alcohol use.  Again, I talked to him about this.  His wife agrees with what I was telling him about stopping the alcohol and trying to prevent the cirrhosis.  I will see him back in 3 months' time.    ______________________________ Josph Macho, M.D. PRE/MEDQ  D:  04/01/2013  T:  04/02/2013  Job:  [id number]

## 2013-04-06 LAB — KAPPA/LAMBDA LIGHT CHAINS
Kappa free light chain: 30.9 mg/dL — ABNORMAL HIGH (ref 0.33–1.94)
Kappa:Lambda Ratio: 220.71 — ABNORMAL HIGH (ref 0.26–1.65)
Lambda Free Lght Chn: 0.14 mg/dL — ABNORMAL LOW (ref 0.57–2.63)

## 2013-04-06 LAB — COMPREHENSIVE METABOLIC PANEL
ALT: <8 U/L (ref 0–53)
AST: 10 U/L (ref 0–37)
Albumin: 3.4 g/dL — ABNORMAL LOW (ref 3.5–5.2)
Alkaline Phosphatase: 67 U/L (ref 39–117)
BUN: 8 mg/dL (ref 6–23)
Chloride: 100 mEq/L (ref 96–112)
Creatinine, Ser: 0.6 mg/dL (ref 0.50–1.35)
Glucose, Bld: 91 mg/dL (ref 70–99)
Potassium: 4.6 mEq/L (ref 3.5–5.3)
Total Bilirubin: 0.4 mg/dL (ref 0.3–1.2)

## 2013-04-06 LAB — PROTEIN ELECTROPHORESIS, SERUM, WITH REFLEX
Albumin ELP: 44.4 % — ABNORMAL LOW (ref 55.8–66.1)
Alpha-2-Globulin: 12.1 % — ABNORMAL HIGH (ref 7.1–11.8)
Beta 2: 24.9 % — ABNORMAL HIGH (ref 3.2–6.5)
Beta Globulin: 6.1 % (ref 4.7–7.2)
Gamma Globulin: 3.5 % — ABNORMAL LOW (ref 11.1–18.8)
M-Spike, %: 1.56 g/dL
Total Protein, Serum Electrophoresis: 6.6 g/dL (ref 6.0–8.3)

## 2013-04-06 LAB — IGG, IGA, IGM
IgA: 7 mg/dL — ABNORMAL LOW (ref 68–379)
IgG (Immunoglobin G), Serum: 264 mg/dL — ABNORMAL LOW (ref 650–1600)

## 2013-04-13 ENCOUNTER — Ambulatory Visit: Payer: Medicare Other

## 2013-04-13 ENCOUNTER — Ambulatory Visit (INDEPENDENT_AMBULATORY_CARE_PROVIDER_SITE_OTHER): Payer: Medicare Other | Admitting: Family Medicine

## 2013-04-13 VITALS — BP 102/60 | HR 106 | Temp 99.1°F | Resp 17 | Ht 68.0 in | Wt 165.0 lb

## 2013-04-13 DIAGNOSIS — R05 Cough: Secondary | ICD-10-CM

## 2013-04-13 DIAGNOSIS — J441 Chronic obstructive pulmonary disease with (acute) exacerbation: Secondary | ICD-10-CM

## 2013-04-13 DIAGNOSIS — J449 Chronic obstructive pulmonary disease, unspecified: Secondary | ICD-10-CM

## 2013-04-13 LAB — POCT CBC
Granulocyte percent: 79.6 %G (ref 37–80)
Hemoglobin: 13.2 g/dL — AB (ref 14.1–18.1)
MCV: 97.7 fL — AB (ref 80–97)
MID (cbc): 1.2 — AB (ref 0–0.9)
MPV: 6.7 fL (ref 0–99.8)
POC Granulocyte: 13.5 — AB (ref 2–6.9)
POC MID %: 7.1 %M (ref 0–12)
Platelet Count, POC: 306 10*3/uL (ref 142–424)
RBC: 4.49 M/uL — AB (ref 4.69–6.13)

## 2013-04-13 MED ORDER — IPRATROPIUM BROMIDE 0.02 % IN SOLN
0.5000 mg | Freq: Once | RESPIRATORY_TRACT | Status: AC
  Administered 2013-04-13: 0.5 mg via RESPIRATORY_TRACT

## 2013-04-13 MED ORDER — ALBUTEROL SULFATE (2.5 MG/3ML) 0.083% IN NEBU
2.5000 mg | INHALATION_SOLUTION | Freq: Once | RESPIRATORY_TRACT | Status: AC
  Administered 2013-04-13: 2.5 mg via RESPIRATORY_TRACT

## 2013-04-13 MED ORDER — PREDNISONE 20 MG PO TABS: 40.0000 mg | ORAL_TABLET | Freq: Every day | ORAL | Status: DC

## 2013-04-13 MED ORDER — IPRATROPIUM-ALBUTEROL 20-100 MCG/ACT IN AERS: 1.0000 | INHALATION_SPRAY | Freq: Four times a day (QID) | RESPIRATORY_TRACT | Status: DC

## 2013-04-13 MED ORDER — METHYLPREDNISOLONE SODIUM SUCC 125 MG IJ SOLR
125.0000 mg | Freq: Once | INTRAMUSCULAR | Status: AC
  Administered 2013-04-13: 125 mg via INTRAMUSCULAR

## 2013-04-13 MED ORDER — CEFTRIAXONE SODIUM 1 G IJ SOLR
1.0000 g | Freq: Once | INTRAMUSCULAR | Status: AC
  Administered 2013-04-13: 1 g via INTRAMUSCULAR

## 2013-04-13 MED ORDER — DOXYCYCLINE HYCLATE 100 MG PO CAPS: 100.0000 mg | ORAL_CAPSULE | Freq: Two times a day (BID) | ORAL | Status: DC

## 2013-04-13 NOTE — Progress Notes (Signed)
Subjective:    Patient ID: [name] [name], male    DOB: [birth year], 72 y.o.   MRN: [id number] Chief Complaint  Patient presents with  . Cough    x5 days cough worse at night   . Nasal Congestion    HPI  [name] [name] developed a bad chest cold last Friday and coughing up a lot of clear phelgm.  Doesn't think he has had any fevers or chills but has been diaphoretic and reports night sweats.  Has been having nasal cong and sinus pressure, no ear or gum pain. No HAs. Does have sore throat.  No CP but does have some chest pain w/ coughing. Does have SHoB and is more easily winded w/ activity - DOE.  Normal appetite.  Not sleeping well.  Not using any otc meds.  Not smoking but has a h/o pna.  Past Medical History  Diagnosis Date  . Lymphadenopathy   . Hyperlipidemia   . COPD (chronic obstructive pulmonary disease)   . Chills   . Low grade fever   . Unintentional weight loss   . Nasal congestion   . Hearing loss   . Weakness   . Substance abuse     beer consumption - 6 per day.  . Cancer     lymphoma  . Waldenstrom macroglobulinemia 07/10/2011   Current Outpatient Prescriptions on File Prior to Visit  Medication Sig Dispense Refill  . Cyanocobalamin (VITAMIN B 12 PO) Take by mouth every morning.      . fish oil-omega-3 fatty acids 1000 MG capsule Take 1 g by mouth daily.       . traMADol (ULTRAM) 50 MG tablet Take 1 tablet (50 mg total) by mouth every 6 (six) hours as needed.  90 tablet  3   No current facility-administered medications on file prior to visit.   No Known Allergies   Review of Systems  Constitutional: Positive for diaphoresis, activity change and fatigue. Negative for fever, chills and appetite change.  HENT: Positive for congestion, postnasal drip, rhinorrhea, sinus pressure and sore throat. Negative for dental problem, ear discharge, ear pain, sneezing and trouble swallowing.   Respiratory: Positive for cough and shortness of breath. Negative for wheezing.     Cardiovascular: Negative for chest pain.  Gastrointestinal: Negative for nausea, vomiting, abdominal pain, diarrhea and constipation.  Endocrine:       Night sweats  Genitourinary: Negative for dysuria and difficulty urinating.  Musculoskeletal: Positive for arthralgias. Negative for myalgias, neck pain and neck stiffness.  Neurological: Negative for headaches.  Hematological: Negative for adenopathy.  Psychiatric/Behavioral: Positive for sleep disturbance.       BP 102/60  Pulse 118  Temp(Src) 99.1 F (37.3 C) (Oral)  Resp 17  Ht 5\' 8"  (1.727 m)  Wt 165 lb (74.844 kg)  BMI 25.09 kg/m2  SpO2 92% Objective:   Physical Exam  Constitutional: He is oriented to person, place, and time. He appears well-developed and well-nourished. No distress.  HENT:  Head: Normocephalic and atraumatic.  Right Ear: External ear and ear canal normal. A middle ear effusion is present.  Left Ear: External ear and ear canal normal.  Nose: Rhinorrhea present.  Mouth/Throat: Mucous membranes are normal. Posterior oropharyngeal erythema present. No oropharyngeal exudate or posterior oropharyngeal edema.  Left ear occluded w/ cerumen Oropharynx w/ red streaking w/ PND  Eyes: Conjunctivae are normal. No scleral icterus.  Neck: Normal range of motion. Neck supple. No thyromegaly present.  Cardiovascular: Normal rate, regular rhythm, normal  heart sounds and intact distal pulses.   Pulmonary/Chest: Effort normal. No respiratory distress. He has decreased breath sounds. He has wheezes in the right upper field and the left upper field. He has rhonchi in the right lower field and the left lower field. He has rales in the right lower field and the left lower field.  Musculoskeletal: He exhibits no edema.  Lymphadenopathy:    He has cervical adenopathy.       Right cervical: Superficial cervical adenopathy present. No posterior cervical adenopathy present.      Left cervical: Superficial cervical adenopathy  present. No posterior cervical adenopathy present.       Right: No supraclavicular adenopathy present.       Left: No supraclavicular adenopathy present.  Neurological: He is alert and oriented to person, place, and time.  Skin: Skin is warm and dry. He is not diaphoretic. No erythema.  Psychiatric: He has a normal mood and affect. His behavior is normal.      [name] [name] reading (PRIMARY) by  [name] [name]. CXR: No sig change from prior xray 07/2011. Coarsened lung fields.  EXAM: CHEST 2 VIEW  COMPARISON: Chest CT 02/05/2012. Radiographs 07/30/2011.  FINDINGS: The heart size and mediastinal contours are stable. There is stable mild chronic lung disease. No airspace disease, pleural effusion or pneumothorax is identified. Old rib fractures are again noted on the left.  IMPRESSION: Stable chest with stable chronic lung disease. No acute cardiopulmonary process identified.    Results for orders placed in visit on 04/13/13  POCT CBC      Result Value Range   WBC 17.0 (*) 4.6 - 10.2 K/uL   Lymph, poc 2.3  0.6 - 3.4   POC LYMPH PERCENT 13.3  10 - 50 %L   MID (cbc) 1.2 (*) 0 - 0.9   POC MID % 7.1  0 - 12 %M   POC Granulocyte 13.5 (*) 2 - 6.9   Granulocyte percent 79.6  37 - 80 %G   RBC 4.49 (*) 4.69 - 6.13 M/uL   Hemoglobin 13.2 (*) 14.1 - 18.1 g/dL   HCT, POC 16.1  09.6 - 53.7 %   MCV 97.7 (*) 80 - 97 fL   MCH, POC 29.4  27 - 31.2 pg   MCHC 30.1 (*) 31.8 - 35.4 g/dL   RDW, POC 04.5     Platelet Count, POC 306  142 - 424 K/uL   MPV 6.7  0 - 99.8 fL   Assessment & Plan:  COPD (chronic obstructive pulmonary disease) - Plan: albuterol (PROVENTIL) (2.5 MG/3ML) 0.083% nebulizer solution 2.5 mg, ipratropium (ATROVENT) nebulizer solution 0.5 mg, albuterol (PROVENTIL) (2.5 MG/3ML) 0.083% nebulizer solution 2.5 mg  Cough - Plan: POCT CBC, DG Chest 2 View, albuterol (PROVENTIL) (2.5 MG/3ML) 0.083% nebulizer solution 2.5 mg  COPD exacerbation - Plan: cefTRIAXone (ROCEPHIN) injection 1 g,  methylPREDNISolone sodium succinate (SOLU-MEDROL) 125 mg/2 mL injection 125 mg, albuterol (PROVENTIL) (2.5 MG/3ML) 0.083% nebulizer solution 2.5 mg - Discussed hospitalization w/ pt as his O2 sats are ranging form 90-92% on RA. He felt a little better on 2L [place] in clinic but no sig improvement on O2 sats. No sig improvement after watching in office 1 hr after duoneb, SoluMedrol, albuterol. Pt declines going to the hosp at this point - insists that his O2 sats are always this low - though I have no documentation of this. He does have a caregiver who will be staying w/ him and will bring him to the ER  if he worsens. He appears clinically well and stable other than poor pulm exam and O2 sats which may be chronic. Start doxy and prednisone tomorrow morning and recheck in clinic in 2d. Arranged for home O2 to start. Pt and caregiver understand risks of home care vs hosp and want to proceed w/ trial of o/p mngmnt.  Meds ordered this encounter  Medications  . albuterol (PROVENTIL) (2.5 MG/3ML) 0.083% nebulizer solution 2.5 mg    Sig:   . ipratropium (ATROVENT) nebulizer solution 0.5 mg    Sig:   . doxycycline (VIBRAMYCIN) 100 MG capsule    Sig: Take 1 capsule (100 mg total) by mouth 2 (two) times daily.    Dispense:  20 capsule    Refill:  0  . predniSONE (DELTASONE) 20 MG tablet    Sig: Take 2 tablets (40 mg total) by mouth daily with breakfast.    Dispense:  10 tablet    Refill:  0  . Ipratropium-Albuterol (COMBIVENT RESPIMAT) 20-100 MCG/ACT AERS respimat    Sig: Inhale 1 puff into the lungs every 6 (six) hours.    Dispense:  1 Inhaler    Refill:  2  . cefTRIAXone (ROCEPHIN) injection 1 g    Sig:   . methylPREDNISolone sodium succinate (SOLU-MEDROL) 125 mg/2 mL injection 125 mg    Sig:   . albuterol (PROVENTIL) (2.5 MG/3ML) 0.083% nebulizer solution 2.5 mg    Sig:     I personally performed the services described in this documentation, which was scribed in my presence. The recorded information  has been reviewed and considered, and addended by me as needed.  Norberto Sorenson, MD MPH  Over 50 minutes spent in face-to-face eval of pt.

## 2013-04-13 NOTE — Patient Instructions (Signed)
Pneumonia, Adult Pneumonia is an infection of the lungs.  CAUSES Pneumonia may be caused by bacteria or a virus. Usually, these infections are caused by breathing infectious particles into the lungs (respiratory tract). SYMPTOMS   Cough.  Fever.  Chest pain.  Increased rate of breathing.  Wheezing.  Mucus production. DIAGNOSIS  If you have the common symptoms of pneumonia, your caregiver will typically confirm the diagnosis with a chest X-ray. The X-ray will show an abnormality in the lung (pulmonary infiltrate) if you have pneumonia. Other tests of your blood, urine, or sputum may be done to find the specific cause of your pneumonia. Your caregiver may also do tests (blood gases or pulse oximetry) to see how well your lungs are working. TREATMENT  Some forms of pneumonia may be spread to other people when you cough or sneeze. You may be asked to wear a mask before and during your exam. Pneumonia that is caused by bacteria is treated with antibiotic medicine. Pneumonia that is caused by the influenza virus may be treated with an antiviral medicine. Most other viral infections must run their course. These infections will not respond to antibiotics.  PREVENTION A pneumococcal shot (vaccine) is available to prevent a common bacterial cause of pneumonia. This is usually suggested for:  People over 77 years old.  Patients on chemotherapy.  People with chronic lung problems, such as bronchitis or emphysema.  People with immune system problems. If you are over 65 or have a high risk condition, you may receive the pneumococcal vaccine if you have not received it before. In some countries, a routine influenza vaccine is also recommended. This vaccine can help prevent some cases of pneumonia.You may be offered the influenza vaccine as part of your care. If you smoke, it is time to quit. You may receive instructions on how to stop smoking. Your caregiver can provide medicines and counseling  to help you quit. HOME CARE INSTRUCTIONS   Cough suppressants may be used if you are losing too much rest. However, coughing protects you by clearing your lungs. You should avoid using cough suppressants if you can.  Your caregiver may have prescribed medicine if he or she thinks your pneumonia is caused by a bacteria or influenza. Finish your medicine even if you start to feel better.  Your caregiver may also prescribe an expectorant. This loosens the mucus to be coughed up.  Only take over-the-counter or prescription medicines for pain, discomfort, or fever as directed by your caregiver.  Do not smoke. Smoking is a common cause of bronchitis and can contribute to pneumonia. If you are a smoker and continue to smoke, your cough may last several weeks after your pneumonia has cleared.  A cold steam vaporizer or humidifier in your room or home may help loosen mucus.  Coughing is often worse at night. Sleeping in a semi-upright position in a recliner or using a couple pillows under your head will help with this.  Get rest as you feel it is needed. Your body will usually let you know when you need to rest. SEEK IMMEDIATE MEDICAL CARE IF:   Your illness becomes worse. This is especially true if you are elderly or weakened from any other disease.  You cannot control your cough with suppressants and are losing sleep.  You begin coughing up blood.  You develop pain which is getting worse or is uncontrolled with medicines.  You have a fever.  Any of the symptoms which initially brought you in for treatment  are getting worse rather than better.  You develop shortness of breath or chest pain. MAKE SURE YOU:   Understand these instructions.  Will watch your condition.  Will get help right away if you are not doing well or get worse. Document Released: 04/01/2005 Document Revised: 06/24/2011 Document Reviewed: 06/21/2010 [facility] Patient Information 2014 [place], [place]. Chronic  Obstructive Pulmonary Disease Chronic obstructive pulmonary disease (COPD) is a condition in which airflow from the lungs is restricted. The lungs can never return to normal, but there are measures you can take which will improve them and make you feel better. CAUSES   Smoking.  Exposure to secondhand smoke.  Breathing in irritants such as air pollution, dust, cigarette smoke, strong odors, aerosol sprays, or paint fumes.  History of lung infections. SYMPTOMS   Deep, persistent (chronic) cough with a large amount of thick mucus.  Wheezing.  Shortness of breath, especially with physical activity.  Feeling like you cannot get enough air.  Difficulty breathing.  Rapid breaths (tachypnea).  Gray or bluish discoloration (cyanosis) of the skin, especially in fingers, toes, or lips.  Fatigue.  Weight loss.  Swelling in legs, ankles, or feet.  Fast heartbeat (tachycardia).  Frequent lung infections.   Chest tightness. DIAGNOSIS  Initial diagnosis may be based on your history, symptoms, and physical examination. Additional tests for COPD may include:  Chest X-ray.  Computed tomography (CT) scan.  Lung (pulmonary) function tests.  Blood tests. TREATMENT  Treatment focuses on making you comfortable (supportive care). Your caregiver may prescribe medicines (inhaled or pills) to help improve your breathing. Additional treatment options may include oxygen therapy and pulmonary rehabilitation. Treatment should also include reducing your exposure to known irritants and following a plan to stop smoking. HOME CARE INSTRUCTIONS   Take all medicines, including antibiotic medicines, as directed by your caregiver.  Use inhaled medicines as directed by your caregiver.  Avoid medicines or cough syrups that dry up your airway (antihistamines) and slow down the elimination of secretions. This decreases respiratory capacity and may lead to infections.  If you smoke, stop  smoking.  Avoid exposure to smoke, chemicals, and fumes that aggravate your breathing.  Avoid contact with individuals that have a contagious illness.  Avoid extreme temperature and humidity changes.  Use humidifiers at home and at your bedside if they do not make breathing difficult.  Drink enough water and fluids to keep your urine clear or pale yellow. This loosens secretions.  Eat healthy foods. Eating smaller, more frequent meals and resting before meals may help you maintain your strength.  Ask your caregiver about the use of vitamins and mineral supplements.  Stay active. Exercise and physical activity will help maintain your ability to do things you want to do.  Balance activity with periods of rest.  Assume a position of comfort if you become short of breath.  Learn and use relaxation techniques.  Learn and use controlled breathing techniques as directed by your caregiver. Controlled breathing techniques include:  Pursed lip breathing. This breathing technique starts with breathing in (inhaling) through your nose for 1 second. Next, purse your lips as if you were going to whistle. Then breathe out (exhale) through the pursed lips for 2 seconds.  Diaphragmatic breathing. Start by putting one hand on your abdomen just above your waist. Inhale slowly through your nose. The hand on your abdomen should move out. Then exhale slowly through pursed lips. You should be able to feel the hand on your abdomen moving in as you  exhale.  Learn and use controlled coughing to clear mucus from your lungs. Controlled coughing is a series of short, progressive coughs. The steps of controlled coughing are: 1. Lean your head slightly forward. 2. Breathe in deeply using diaphragmatic breathing. 3. Try to hold your breath for 3 seconds. 4. Keep your mouth slightly open while coughing twice. 5. Spit any mucus out into a tissue. 6. Rest and repeat the steps once or twice as needed.  Receive all  protective vaccines your caregiver suggests, especially pneumococcal and influenza vaccines.  Learn to manage stress.  Schedule and attend all follow-up appointments as directed by your caregiver. It is important to keep all your appointments.  Participate in pulmonary rehabilitation as directed by your caregiver.  Use home oxygen as suggested. SEEK MEDICAL CARE IF:   You are coughing up more mucus than usual.  There is a change in the color or thickness of the mucus.  Breathing is more labored than usual.  Your breathing is faster than usual.  Your skin color is more cyanotic than usual.  You are running out of the medicine you take for your breathing.  You are anxious, apprehensive, or restless.  You have a fever. SEEK IMMEDIATE MEDICAL CARE IF:   You have a rapid heart rate.  You have shortness of breath while you are resting.  You have shortness of breath that prevents you from being able to talk.  You have shortness of breath that prevents you from performing your usual physical activities.  You have chest pain lasting longer than 5 minutes.  You have a seizure.  Your family or friends notice that you are agitated or confused. MAKE SURE YOU:   Understand these instructions.  Will watch your condition.  Will get help right away if you are not doing well or get worse. Document Released: 01/09/2005 Document Revised: 12/25/2011 Document Reviewed: 11/26/2012 [facility] Patient Information 2014 [place], [place].

## 2013-04-14 ENCOUNTER — Telehealth: Payer: Self-pay

## 2013-04-14 NOTE — Telephone Encounter (Signed)
[employer] called and stated that the pt does not qualify to have ins cover O2, has to be under 88% on RA. She will call pt and see if he wants to pay OOP and advise him to RTC or go to ER if worsens. Pt was instr'd at OV to return tomorrow for recheck. Dr Clelia Croft, [place].

## 2013-04-23 ENCOUNTER — Encounter (HOSPITAL_COMMUNITY): Payer: Self-pay | Admitting: Emergency Medicine

## 2013-04-23 ENCOUNTER — Inpatient Hospital Stay (HOSPITAL_COMMUNITY)
Admission: EM | Admit: 2013-04-23 | Discharge: 2013-04-27 | DRG: 193 | Disposition: A | Discharge status: Home Health | Payer: Medicare Other | Attending: Internal Medicine | Admitting: Internal Medicine

## 2013-04-23 ENCOUNTER — Emergency Department (HOSPITAL_COMMUNITY): Payer: Medicare Other

## 2013-04-23 DIAGNOSIS — C8800 Waldenstrom macroglobulinemia not having achieved remission: Secondary | ICD-10-CM | POA: Diagnosis present

## 2013-04-23 DIAGNOSIS — R634 Abnormal weight loss: Secondary | ICD-10-CM

## 2013-04-23 DIAGNOSIS — J1100 Influenza due to unidentified influenza virus with unspecified type of pneumonia: Principal | ICD-10-CM | POA: Diagnosis present

## 2013-04-23 DIAGNOSIS — J449 Chronic obstructive pulmonary disease, unspecified: Secondary | ICD-10-CM | POA: Diagnosis present

## 2013-04-23 DIAGNOSIS — D6481 Anemia due to antineoplastic chemotherapy: Secondary | ICD-10-CM

## 2013-04-23 DIAGNOSIS — A419 Sepsis, unspecified organism: Secondary | ICD-10-CM | POA: Diagnosis present

## 2013-04-23 DIAGNOSIS — T451X5A Adverse effect of antineoplastic and immunosuppressive drugs, initial encounter: Secondary | ICD-10-CM

## 2013-04-23 DIAGNOSIS — E871 Hypo-osmolality and hyponatremia: Secondary | ICD-10-CM | POA: Diagnosis present

## 2013-04-23 DIAGNOSIS — E785 Hyperlipidemia, unspecified: Secondary | ICD-10-CM | POA: Diagnosis present

## 2013-04-23 DIAGNOSIS — F1010 Alcohol abuse, uncomplicated: Secondary | ICD-10-CM | POA: Diagnosis present

## 2013-04-23 DIAGNOSIS — Z87891 Personal history of nicotine dependence: Secondary | ICD-10-CM

## 2013-04-23 DIAGNOSIS — Z823 Family history of stroke: Secondary | ICD-10-CM

## 2013-04-23 DIAGNOSIS — D72829 Elevated white blood cell count, unspecified: Secondary | ICD-10-CM | POA: Diagnosis present

## 2013-04-23 DIAGNOSIS — E869 Volume depletion, unspecified: Secondary | ICD-10-CM | POA: Diagnosis present

## 2013-04-23 DIAGNOSIS — Z79899 Other long term (current) drug therapy: Secondary | ICD-10-CM

## 2013-04-23 DIAGNOSIS — J189 Pneumonia, unspecified organism: Secondary | ICD-10-CM | POA: Diagnosis present

## 2013-04-23 DIAGNOSIS — J9690 Respiratory failure, unspecified, unspecified whether with hypoxia or hypercapnia: Secondary | ICD-10-CM | POA: Diagnosis present

## 2013-04-23 DIAGNOSIS — R591 Generalized enlarged lymph nodes: Secondary | ICD-10-CM

## 2013-04-23 DIAGNOSIS — J4489 Other specified chronic obstructive pulmonary disease: Secondary | ICD-10-CM | POA: Diagnosis present

## 2013-04-23 DIAGNOSIS — J9600 Acute respiratory failure, unspecified whether with hypoxia or hypercapnia: Secondary | ICD-10-CM | POA: Diagnosis present

## 2013-04-23 DIAGNOSIS — J111 Influenza due to unidentified influenza virus with other respiratory manifestations: Secondary | ICD-10-CM

## 2013-04-23 DIAGNOSIS — C8589 Other specified types of non-Hodgkin lymphoma, extranodal and solid organ sites: Secondary | ICD-10-CM | POA: Diagnosis present

## 2013-04-23 LAB — URINALYSIS, ROUTINE W REFLEX MICROSCOPIC
Bilirubin Urine: NEGATIVE
Glucose, UA: NEGATIVE mg/dL
Hgb urine dipstick: NEGATIVE
Ketones, ur: NEGATIVE mg/dL
Leukocytes, UA: NEGATIVE
Nitrite: NEGATIVE
Protein, ur: NEGATIVE mg/dL
Specific Gravity, Urine: 1.012 (ref 1.005–1.030)
Urobilinogen, UA: 0.2 mg/dL (ref 0.0–1.0)
pH: 7 (ref 5.0–8.0)

## 2013-04-23 LAB — CBC WITH DIFFERENTIAL/PLATELET
Basophils Absolute: 0 10*3/uL (ref 0.0–0.1)
Basophils Relative: 0 % (ref 0–1)
Eosinophils Absolute: 0 10*3/uL (ref 0.0–0.7)
Eosinophils Relative: 0 % (ref 0–5)
HCT: 39.1 % (ref 39.0–52.0)
Hemoglobin: 13.1 g/dL (ref 13.0–17.0)
Lymphocytes Relative: 3 % — ABNORMAL LOW (ref 12–46)
Lymphs Abs: 0.9 10*3/uL (ref 0.7–4.0)
MCH: 30.4 pg (ref 26.0–34.0)
MCHC: 33.5 g/dL (ref 30.0–36.0)
MCV: 90.7 fL (ref 78.0–100.0)
Monocytes Absolute: 0.9 10*3/uL (ref 0.1–1.0)
Monocytes Relative: 3 % (ref 3–12)
Neutro Abs: 27.4 10*3/uL — ABNORMAL HIGH (ref 1.7–7.7)
Neutrophils Relative %: 94 % — ABNORMAL HIGH (ref 43–77)
Platelets: 199 10*3/uL (ref 150–400)
RBC: 4.31 MIL/uL (ref 4.22–5.81)
RDW: 13.3 % (ref 11.5–15.5)
WBC: 29.2 10*3/uL — ABNORMAL HIGH (ref 4.0–10.5)

## 2013-04-23 LAB — INFLUENZA PANEL BY PCR (TYPE A & B)
H1N1 flu by pcr: NOT DETECTED
Influenza A By PCR: POSITIVE — AB
Influenza B By PCR: NEGATIVE

## 2013-04-23 LAB — CG4 I-STAT (LACTIC ACID): Lactic Acid, Venous: 0.86 mmol/L (ref 0.5–2.2)

## 2013-04-23 LAB — BASIC METABOLIC PANEL
BUN: 12 mg/dL (ref 6–23)
CO2: 26 mEq/L (ref 19–32)
Calcium: 8.9 mg/dL (ref 8.4–10.5)
Chloride: 94 mEq/L — ABNORMAL LOW (ref 96–112)
Creatinine, Ser: 0.63 mg/dL (ref 0.50–1.35)
GFR calc Af Amer: >90 mL/min (ref >90)
GFR calc non Af Amer: >90 mL/min (ref >90)
Glucose, Bld: 156 mg/dL — ABNORMAL HIGH (ref 70–99)
Potassium: 4.4 mEq/L (ref 3.7–5.3)
Sodium: 133 mEq/L — ABNORMAL LOW (ref 137–147)

## 2013-04-23 LAB — STREP PNEUMONIAE URINARY ANTIGEN: Strep Pneumo Urinary Antigen: NEGATIVE

## 2013-04-23 MED ORDER — ALBUTEROL SULFATE (2.5 MG/3ML) 0.083% IN NEBU
5.0000 mg | INHALATION_SOLUTION | Freq: Once | RESPIRATORY_TRACT | Status: AC
  Administered 2013-04-23: 5 mg via RESPIRATORY_TRACT
  Filled 2013-04-23: qty 6

## 2013-04-23 MED ORDER — IPRATROPIUM-ALBUTEROL 0.5-2.5 (3) MG/3ML IN SOLN
3.0000 mL | RESPIRATORY_TRACT | Status: DC
  Administered 2013-04-23 – 2013-04-24 (×8): 3 mL via RESPIRATORY_TRACT
  Filled 2013-04-23 (×8): qty 3

## 2013-04-23 MED ORDER — AZITHROMYCIN 250 MG PO TABS
500.0000 mg | ORAL_TABLET | Freq: Once | ORAL | Status: AC
  Administered 2013-04-23: 500 mg via ORAL
  Filled 2013-04-23: qty 2

## 2013-04-23 MED ORDER — LORAZEPAM 2 MG/ML IJ SOLN
1.0000 mg | Freq: Four times a day (QID) | INTRAMUSCULAR | Status: AC | PRN
Start: 2013-04-23 — End: 2013-04-26

## 2013-04-23 MED ORDER — SODIUM CHLORIDE 0.9 % IV BOLUS (SEPSIS)
1000.0000 mL | Freq: Once | INTRAVENOUS | Status: AC
  Administered 2013-04-23: 1000 mL via INTRAVENOUS

## 2013-04-23 MED ORDER — DEXTROSE 5 % IV SOLN
1.0000 g | Freq: Once | INTRAVENOUS | Status: AC
  Administered 2013-04-23: 1 g via INTRAVENOUS
  Filled 2013-04-23: qty 10

## 2013-04-23 MED ORDER — DEXTROSE 5 % IV SOLN
500.0000 mg | INTRAVENOUS | Status: DC
  Administered 2013-04-24: 500 mg via INTRAVENOUS
  Filled 2013-04-23 (×2): qty 500

## 2013-04-23 MED ORDER — IPRATROPIUM BROMIDE 0.02 % IN SOLN
0.5000 mg | Freq: Once | RESPIRATORY_TRACT | Status: AC
  Administered 2013-04-23: 0.5 mg via RESPIRATORY_TRACT
  Filled 2013-04-23: qty 2.5

## 2013-04-23 MED ORDER — DEXTROSE 5 % IV SOLN
1.0000 g | INTRAVENOUS | Status: DC
  Administered 2013-04-24 – 2013-04-26 (×3): 1 g via INTRAVENOUS
  Filled 2013-04-23 (×4): qty 10

## 2013-04-23 MED ORDER — ACETAMINOPHEN 325 MG PO TABS
650.0000 mg | ORAL_TABLET | Freq: Four times a day (QID) | ORAL | Status: DC | PRN
  Administered 2013-04-24: 650 mg via ORAL
  Filled 2013-04-23 (×3): qty 2

## 2013-04-23 MED ORDER — ONDANSETRON HCL 4 MG/2ML IJ SOLN: 4.0000 mg | Freq: Four times a day (QID) | INTRAMUSCULAR | Status: DC | PRN

## 2013-04-23 MED ORDER — DOCUSATE SODIUM 100 MG PO CAPS
100.0000 mg | ORAL_CAPSULE | Freq: Two times a day (BID) | ORAL | Status: DC | PRN
  Filled 2013-04-23: qty 1

## 2013-04-23 MED ORDER — ACETAMINOPHEN 325 MG PO TABS
650.0000 mg | ORAL_TABLET | Freq: Once | ORAL | Status: AC
  Administered 2013-04-23: 650 mg via ORAL
  Filled 2013-04-23: qty 2

## 2013-04-23 MED ORDER — SODIUM CHLORIDE 0.9 % IV SOLN
INTRAVENOUS | Status: DC
  Administered 2013-04-23 – 2013-04-25 (×4): via INTRAVENOUS
  Administered 2013-04-25: 100 mL/h via INTRAVENOUS
  Administered 2013-04-26: 16:00:00 via INTRAVENOUS

## 2013-04-23 MED ORDER — BENZONATATE 100 MG PO CAPS
100.0000 mg | ORAL_CAPSULE | Freq: Three times a day (TID) | ORAL | Status: DC | PRN
  Filled 2013-04-23: qty 1

## 2013-04-23 MED ORDER — LORAZEPAM 1 MG PO TABS
1.0000 mg | ORAL_TABLET | Freq: Four times a day (QID) | ORAL | Status: AC | PRN
  Administered 2013-04-23: 1 mg via ORAL
  Filled 2013-04-23 (×2): qty 1

## 2013-04-23 MED ORDER — ENOXAPARIN SODIUM 40 MG/0.4ML ~~LOC~~ SOLN
40.0000 mg | SUBCUTANEOUS | Status: DC
  Administered 2013-04-24 – 2013-04-25 (×2): 40 mg via SUBCUTANEOUS
  Filled 2013-04-23 (×4): qty 0.4

## 2013-04-23 NOTE — ED Notes (Signed)
Patient transported to X-ray 

## 2013-04-23 NOTE — Progress Notes (Signed)
Utilization Review completed.  Alic Hilburn RN CM  

## 2013-04-23 NOTE — ED Notes (Signed)
Pt complains of weakness since this am. Pt denies any changes in sob or new pain. Pt able stand up with assist. Pt warm to touch.

## 2013-04-23 NOTE — ED Notes (Signed)
Wife spoke with NT. States pt drinks etoh daily and thinks pt is going into withdrawal. Pt states he drank 2 cans of beer last night.

## 2013-04-23 NOTE — ED Provider Notes (Signed)
CSN: [id number]     Arrival date & time 04/23/13  1117 History   First MD Initiated Contact with Patient 04/23/13 1128     Chief Complaint  Patient presents with  . Fatigue  . Fever   (Consider location/radiation/quality/duration/timing/severity/associated sxs/prior Treatment) HPI  73 y.o. male with a past medical history of chronic obstructive pulmonary disease, who presents to the emergency department with complaints of cough/shortness of breath. He states symptoms started approximately 2 weeks ago, having a productive cough with yellow sputum production, with associated shortness of breath, subjective fevers and chills, becoming progressively worse. He also complains of night sweats, nasal congestion, sore throat, generalized weakness, fatigue, malaise and poor tolerance to physical exertion. He was administered a dose of ceftriaxone at the urgent care Center on 04/13/2013. Patient has also been on steroids and doxycycline as well. Despite these interventions his symptoms have progressively worsened. He reports becoming so weak this morning he was unable to get out of bed which why came to ED. Normally can take care of ADLs unassisted. Lives with son.  Past Medical History  Diagnosis Date  . Lymphadenopathy   . Hyperlipidemia   . COPD (chronic obstructive pulmonary disease)   . Chills   . Low grade fever   . Unintentional weight loss   . Nasal congestion   . Hearing loss   . Weakness   . Substance abuse     beer consumption - 6 per day.  . Cancer     lymphoma  . Waldenstrom macroglobulinemia 07/10/2011   Past Surgical History  Procedure Laterality Date  . Back surgery  2000   Family History  Problem Relation Age of Onset  . Cancer Mother     stomach  . Stroke Father    History  Substance Use Topics  . Smoking status: Former Smoker -- 2.00 packs/day    Types: Cigarettes    Quit date: 07/04/2001  . Smokeless tobacco: Never Used  . Alcohol Use: Yes     Comment: 6 beers  per day.    Review of Systems  All systems reviewed and negative, other than as noted in HPI.   Allergies  Review of patient's allergies indicates no known allergies.  Home Medications   Current Outpatient Rx  Name  Route  Sig  Dispense  Refill  . Cyanocobalamin (VITAMIN B 12 PO)   Oral   Take by mouth every morning.         . fish oil-omega-3 fatty acids 1000 MG capsule   Oral   Take 1 g by mouth daily.          . Ipratropium-Albuterol (COMBIVENT RESPIMAT) 20-100 MCG/ACT AERS respimat   Inhalation   Inhale 1 puff into the lungs every 6 (six) hours.   1 Inhaler   2   . traMADol (ULTRAM) 50 MG tablet   Oral   Take 1 tablet (50 mg total) by mouth every 6 (six) hours as needed.   90 tablet   3    BP 131/83  Pulse 118  Temp(Src) 102.4 F (39.1 C) (Rectal)  Resp 20  SpO2 92% Physical Exam  Nursing note and vitals reviewed. Constitutional: He appears well-developed and well-nourished.  Laying in bed. Appears tired.   HENT:  Head: Normocephalic and atraumatic.  Eyes: Conjunctivae are normal. Right eye exhibits no discharge. Left eye exhibits no discharge.  Neck: Neck supple.  Cardiovascular: Normal rate, regular rhythm and normal heart sounds.  Exam reveals no gallop and no  friction rub.   No murmur heard. Pulmonary/Chest: Effort normal. No respiratory distress. He has wheezes.  Mild tachypnea.   Abdominal: Soft. He exhibits no distension. There is no tenderness.  Musculoskeletal: He exhibits no edema and no tenderness.  Lower extremities symmetric as compared to each other. No calf tenderness. Negative Homan's. No palpable cords.   Neurological: He is alert.  Skin: Skin is warm and dry.  Psychiatric: He has a normal mood and affect. His behavior is normal. Thought content normal.    ED Course  Procedures (including critical care time) Labs Review Labs Reviewed  CBC WITH DIFFERENTIAL - Abnormal; Notable for the following:    WBC 29.2 (*)     Neutrophils Relative % 94 (*)    Lymphocytes Relative 3 (*)    Neutro Abs 27.4 (*)    All other components within normal limits  BASIC METABOLIC PANEL - Abnormal; Notable for the following:    Sodium 133 (*)    Chloride 94 (*)    Glucose, Bld 156 (*)    All other components within normal limits  URINALYSIS, ROUTINE W REFLEX MICROSCOPIC - Abnormal; Notable for the following:    APPearance CLOUDY (*)    All other components within normal limits  INFLUENZA PANEL BY PCR (TYPE A & B, H1N1) - Abnormal; Notable for the following:    Influenza A By PCR POSITIVE (*)    All other components within normal limits  BASIC METABOLIC PANEL - Abnormal; Notable for the following:    Sodium 134 (*)    Glucose, Bld 143 (*)    All other components within normal limits  CBC - Abnormal; Notable for the following:    WBC 15.0 (*)    RBC 3.97 (*)    Hemoglobin 11.9 (*)    HCT 36.2 (*)    All other components within normal limits  CBC - Abnormal; Notable for the following:    RBC 3.72 (*)    Hemoglobin 11.0 (*)    HCT 33.6 (*)    All other components within normal limits  BASIC METABOLIC PANEL - Abnormal; Notable for the following:    Sodium 136 (*)    Potassium 3.6 (*)    Glucose, Bld 109 (*)    All other components within normal limits  CBC - Abnormal; Notable for the following:    RBC 3.60 (*)    Hemoglobin 10.6 (*)    HCT 32.4 (*)    All other components within normal limits  BASIC METABOLIC PANEL - Abnormal; Notable for the following:    Potassium 3.2 (*)    BUN 5 (*)    Creatinine, Ser 0.49 (*)    Calcium 8.0 (*)    All other components within normal limits  CULTURE, BLOOD (ROUTINE X 2)  CULTURE, BLOOD (ROUTINE X 2)  GRAM STAIN  CULTURE, EXPECTORATED SPUTUM-ASSESSMENT  HIV ANTIBODY (ROUTINE TESTING)  LEGIONELLA ANTIGEN, URINE  STREP PNEUMONIAE URINARY ANTIGEN  PROTIME-INR  APTT  BONE MARROW EXAM  CHROMOSOME ANALYSIS, BONE MARROW  CG4 I-STAT (LACTIC ACID)  SURGICAL PATHOLOGY    Imaging Review No results found.  Dg Chest 2 View (if Patient Has Fever And/or Copd)  04/23/2013   CLINICAL DATA:  One month history of chest pain and weakness and dyspnea  EXAM: CHEST  2 VIEW  COMPARISON:  PA and lateral chest x-ray at April 13, 2013.  FINDINGS: The lungs are mildly hyperinflated. There is mildly increased density within the pulmonary interstitium bilaterally. The pulmonary  vascularity is not clearly engorged. The cardiac silhouette is normal in size. There is no pleural effusion or pneumothorax. The mediastinum is normal in width. Old deformities of the left 6th, 7th, and 8th ribs are demonstrated.  IMPRESSION: Mildly increased interstitial markings in both lungs as compared to the previous study may reflect minimal subsegmental atelectasis or early interstitial pneumonia. There are findings consistent with underlying COPD. There is no evidence of CHF.   Electronically Signed   By: David  Martinique   On: 04/23/2013 12:55   Dg Chest 2 View  04/13/2013   CLINICAL DATA:  Shortness of breath.  EXAM: CHEST  2 VIEW  COMPARISON:  Chest CT 02/05/2012.  Radiographs 07/30/2011.  FINDINGS: The heart size and mediastinal contours are stable. There is stable mild chronic lung disease. No airspace disease, pleural effusion or pneumothorax is identified. Old rib fractures are again noted on the left.  IMPRESSION: Stable chest with stable chronic lung disease. No acute cardiopulmonary process identified.   Electronically Signed   By: Camie Patience M.D.   On: 04/13/2013 19:38   Ct Chest W Contrast  04/27/2013   CLINICAL DATA:  Non-Hodgkin's lymphoma, for restaging  EXAM: CT CHEST, ABDOMEN, AND PELVIS WITH CONTRAST  TECHNIQUE: Multidetector CT imaging of the chest, abdomen and pelvis was performed following the standard protocol during bolus administration of intravenous contrast.  CONTRAST:  116m OMNIPAQUE IOHEXOL 300 MG/ML  SOLN  COMPARISON:  02/05/2012  FINDINGS: CT CHEST FINDINGS  Scattered  peribronchovascular micro and macronodularity in the lingula, posterior right upper lobe, and bilateral lower lobes. For example:  --8 mm subpleural nodule in the superior segment left lower lobe (series 5/image 26)  --6 mm nodule in the lingula (series 5/image 27)  --8 mm posterior left lower lobe nodule (series 5/image 29)  --9 mm subpleural right lower lobe nodule along the right major fissure (series 5/image 31)  Associated dependent lower lobe opacity bilateral (series 5/ image 45). Small bilateral pleural effusions. These findings may have been present although to a much lesser extent on prior 2013 CT. Given the posterior/dependent distribution, this appearance is favored to be the sequela of recurrent/chronic aspiration.  Stable calcified pleural plaque at the right lung base (series 5/image 45). No pneumothorax.  Visualized thyroid is unremarkable.  Heart is normal in size. No pericardial effusion. Coronary atherosclerosis. Atherosclerotic calcifications of the aortic arch.  Mildly prominent thoracic lymph nodes, including:  --9 mm short axis prevascular node (series 2/image 22), previously 7 mm  --11 mm short axis posterior paraesophageal node (series 2/image 27), previously 12 mm  --13 mm short axis precarinal node (series 2/image 31), previously 8 mm  --11 mm short axis right retrocrural node (series 2/ image 52), previously 13 mm.  CT ABDOMEN AND PELVIS FINDINGS  Liver, pancreas, and adrenal glands are within normal limits.  Spleen is normal in size.  Splenic granulomata.  Suspected layering tiny gallstone (series 2/ image 70), although without associated inflammatory changes. No intrahepatic or extrahepatic ductal dilatation.  Kidneys are within normal limits. Left extrarenal pelvis with mild urothelial thickened (series 2/ image 67), nonspecific. Bilateral renal vascular calcifications. No hydronephrosis.  No evidence of bowel obstruction.  Normal appendix.  Atherosclerotic calcifications of the  abdominal aorta and branch vessels. Fusiform ectasia of the infrarenal abdominal aorta measuring up to 2.9 x 2.8 cm (series 2/image 73).  Scattered small retroperitoneal/para-aortic nodes measuring up to 9 mm short axis (series 2/ image 71), unchanged.  Prostate is grossly unremarkable.  Bladder  is within normal limits.  Mild degenerative changes of the lumbar spine, most prominent at L5-S1.  IMPRESSION: Borderline enlarged mediastinal, retrocrural, and retroperitoneal lymph nodes, as described above. Overall, this appearance is grossly unchanged from 2013.  Peribronchovascular nodularity, predominantly in the posterior/dependent bilateral lower lobes, with associated bibasilar opacities and small bilateral pleural effusions. These findings have progressed from 2013 and are favored to be related to sequela of recurrent/chronic aspiration.  Associated scattered dominant pulmonary nodules, measuring up to 9 mm, as described above. Given the history of lymphoma, consider follow-up CT chest in 3-6 months to assess for progression. Please note that Fleischner Society guidelines do not apply in the setting of known malignancy.   Electronically Signed   By: Julian Hy M.D.   On: 04/27/2013 13:42   Ct Abdomen Pelvis W Contrast  04/27/2013   CLINICAL DATA:  Non-Hodgkin's lymphoma, for restaging  EXAM: CT CHEST, ABDOMEN, AND PELVIS WITH CONTRAST  TECHNIQUE: Multidetector CT imaging of the chest, abdomen and pelvis was performed following the standard protocol during bolus administration of intravenous contrast.  CONTRAST:  16m OMNIPAQUE IOHEXOL 300 MG/ML  SOLN  COMPARISON:  02/05/2012  FINDINGS: CT CHEST FINDINGS  Scattered peribronchovascular micro and macronodularity in the lingula, posterior right upper lobe, and bilateral lower lobes. For example:  --8 mm subpleural nodule in the superior segment left lower lobe (series 5/image 26)  --6 mm nodule in the lingula (series 5/image 27)  --8 mm posterior left lower  lobe nodule (series 5/image 29)  --9 mm subpleural right lower lobe nodule along the right major fissure (series 5/image 31)  Associated dependent lower lobe opacity bilateral (series 5/ image 45). Small bilateral pleural effusions. These findings may have been present although to a much lesser extent on prior 2013 CT. Given the posterior/dependent distribution, this appearance is favored to be the sequela of recurrent/chronic aspiration.  Stable calcified pleural plaque at the right lung base (series 5/image 45). No pneumothorax.  Visualized thyroid is unremarkable.  Heart is normal in size. No pericardial effusion. Coronary atherosclerosis. Atherosclerotic calcifications of the aortic arch.  Mildly prominent thoracic lymph nodes, including:  --9 mm short axis prevascular node (series 2/image 22), previously 7 mm  --11 mm short axis posterior paraesophageal node (series 2/image 27), previously 12 mm  --13 mm short axis precarinal node (series 2/image 31), previously 8 mm  --11 mm short axis right retrocrural node (series 2/ image 52), previously 13 mm.  CT ABDOMEN AND PELVIS FINDINGS  Liver, pancreas, and adrenal glands are within normal limits.  Spleen is normal in size.  Splenic granulomata.  Suspected layering tiny gallstone (series 2/ image 70), although without associated inflammatory changes. No intrahepatic or extrahepatic ductal dilatation.  Kidneys are within normal limits. Left extrarenal pelvis with mild urothelial thickened (series 2/ image 67), nonspecific. Bilateral renal vascular calcifications. No hydronephrosis.  No evidence of bowel obstruction.  Normal appendix.  Atherosclerotic calcifications of the abdominal aorta and branch vessels. Fusiform ectasia of the infrarenal abdominal aorta measuring up to 2.9 x 2.8 cm (series 2/image 73).  Scattered small retroperitoneal/para-aortic nodes measuring up to 9 mm short axis (series 2/ image 71), unchanged.  Prostate is grossly unremarkable.  Bladder is  within normal limits.  Mild degenerative changes of the lumbar spine, most prominent at L5-S1.  IMPRESSION: Borderline enlarged mediastinal, retrocrural, and retroperitoneal lymph nodes, as described above. Overall, this appearance is grossly unchanged from 2013.  Peribronchovascular nodularity, predominantly in the posterior/dependent bilateral lower lobes, with associated bibasilar  opacities and small bilateral pleural effusions. These findings have progressed from 2013 and are favored to be related to sequela of recurrent/chronic aspiration.  Associated scattered dominant pulmonary nodules, measuring up to 9 mm, as described above. Given the history of lymphoma, consider follow-up CT chest in 3-6 months to assess for progression. Please note that Fleischner Society guidelines do not apply in the setting of known malignancy.   Electronically Signed   By: Julian Hy M.D.   On: 04/27/2013 13:42   Ct Biopsy  04/27/2013   INDICATION: History known lymphoma, please obtain bone marrow aspiration and biopsy  EXAM: CT BIOPSY  MEDICATIONS: Fentanyl 25 mcg IV; Versed 2 mg IV  ANESTHESIA/SEDATION: Sedation Time  10 minutes  CONTRAST:  None  PROCEDURE: Informed consent was obtained from the patient following an explanation of the procedure, risks, benefits and alternatives. The patient understands, agrees and consents for the procedure. All questions were addressed. A time out was performed prior to the initiation of the procedure. The patient was positioned prone and noncontrast localization CT was performed of the pelvis to demonstrate the iliac marrow spaces. The operative site was prepped and draped in the usual sterile fashion.  Under sterile conditions and local anesthesia, an 11 gauge coaxial bone biopsy needle was advanced into the left iliac marrow space. Needle position was confirmed with CT imaging. Initially, bone marrow aspiration was performed. Next, a bone marrow biopsy was obtained with the 11 gauge  outer bone marrow device. Samples were prepared with the cytotechnologist and deemed adequate. The needle was removed intact. Hemostasis was obtained with compression and a dressing was placed. The patient tolerated the procedure well without immediate post procedural complication.  COMPLICATIONS: None immediate.  IMPRESSION: Successful CT guided left iliac bone marrow aspiration and core biopsies.   Electronically Signed   By: Sandi Mariscal M.D.   On: 04/27/2013 08:49   EKG Interpretation   None       MDM   1. Sepsis   2. Community acquired pneumonia   3. COPD (chronic obstructive pulmonary disease)   4. Hyponatremia   5. Leukocytosis   6. Respiratory failure   7. Influenza with respiratory manifestations   8. Alcohol abuse   9. Pneumonia   10. Hyperlipidemia   11. Weight loss   12. Lymphadenopathy   13. Waldenstrom macroglobulinemia   14. Anemia due to chemotherapy    72yM with generalized weakness, fever and cough. Abx for possible CAP. Will check flu. Admission.     Virgel Manifold, MD 04/27/13 [phone]

## 2013-04-23 NOTE — H&P (Signed)
[facility] History and Physical  [name] [name] ZOX:[id number] DOB: 06-04-1940 DOA: 04/23/2013  Referring physician:  PCP: Robyn Haber, MD   Chief Complaint: Cough/shortness of breath  HPI: [name] [name] is a 73 y.o. male with a past medical history of chronic obstructive pulmonary disease, who presents to the emergency department with complaints of cough/shortness of breath. He states symptoms started approximately 2 weeks ago, having a productive cough with yellow sputum production, with associated shortness of breath, subjective fevers and chills, becoming progressively worse. He also complains of night sweats, nasal congestion, sore throat, generalized weakness, fatigue, malaise and poor tolerance to physical exertion. He was administered a dose of ceftriaxone at the urgent care Center on 04/13/2013. Patient has also been on steroids and doxycycline as well. Despite these interventions his symptoms have progressively worsened. He reports becoming so weak this morning he was unable to get out of bed. A chest x-ray in the emergency department today showed mild increase in interstitial markings in both lungs which could reflect atelectasis and or early pneumonia. Initial lab work showed an elevated white count of 29,200 as he was found to be febrile have a temperature 102.4. In the emergency department he was administered a dose of age azithromycin and ceftriaxone.                                                                                                      Review of Systems:  Constitutional:  Positive for night sweats, Fevers, chills, fatigue.  HEENT:  No headaches, Difficulty swallowing,Tooth/dental problems,  No sneezing, itching, ear ache, Positive for nasal congestion Cardio-vascular:  No chest pain, Orthopnea, PND, swelling in lower extremities, anasarca, dizziness, palpitations  GI:  No heartburn, indigestion, abdominal pain, nausea, vomiting, diarrhea, change in bowel  habits, loss of appetite  Resp:  Positive for shortness of breath with exertion or at rest, productive cough. No coughing up of blood.No change in color of mucus.No wheezing.No chest wall deformity  Skin:  no rash or lesions.  GU:  no dysuria, change in color of urine, no urgency or frequency. No flank pain.  Musculoskeletal:  No joint pain or swelling. No decreased range of motion. No back pain.  Psych:  No change in mood or affect. No depression or anxiety. No memory loss.   Past Medical History  Diagnosis Date  . Lymphadenopathy   . Hyperlipidemia   . COPD (chronic obstructive pulmonary disease)   . Chills   . Low grade fever   . Unintentional weight loss   . Nasal congestion   . Hearing loss   . Weakness   . Substance abuse     beer consumption - 6 per day.  . Cancer     lymphoma  . Waldenstrom macroglobulinemia 07/10/2011   Past Surgical History  Procedure Laterality Date  . Back surgery  2000   Social History:  reports that he quit smoking about 11 years ago. His smoking use included Cigarettes. He smoked 2.00 packs per day. He has never used smokeless tobacco. He reports that he drinks alcohol. He reports  that he does not use illicit drugs.  No Known Allergies  Family History  Problem Relation Age of Onset  . Cancer Mother     stomach  . Stroke Father      Prior to Admission medications   Medication Sig Start Date End Date Taking? Authorizing Provider  Cyanocobalamin (VITAMIN B 12 PO) Take by mouth every morning.   Yes Historical Provider, MD  fish oil-omega-3 fatty acids 1000 MG capsule Take 1 g by mouth daily.    Yes Historical Provider, MD  Ipratropium-Albuterol (COMBIVENT RESPIMAT) 20-100 MCG/ACT AERS respimat Inhale 1 puff into the lungs every 6 (six) hours. 04/13/13  Yes Shawnee Knapp, MD  traMADol (ULTRAM) 50 MG tablet Take 1 tablet (50 mg total) by mouth every 6 (six) hours as needed. 04/01/13  Yes Volanda Napoleon, MD   Physical Exam: Filed Vitals:    04/23/13 1316  BP: 109/55  Pulse:   Temp: 98.4 F (36.9 C)  Resp: 18    BP 109/55  Pulse 118  Temp(Src) 98.4 F (36.9 C) (Oral)  Resp 18  SpO2 96%  General:  Ill-appearing. No acute distress Eyes: PERRL, normal lids, irises & conjunctiva ENT: grossly normal hearing, lips & tongue Neck: no LAD, masses or thyromegaly Cardiovascular: RRR, no m/r/g. No LE edema. Telemetry: SR, no arrhythmias  Respiratory: On lung exam he has bibasilar crackles, a few scattered expiratory wheezing, normal respiratory efforts no rhonchi or rales noted Abdomen: soft, ntnd Skin: no rash or induration seen on limited exam Musculoskeletal: grossly normal tone BUE/BLE Psychiatric: grossly normal mood and affect, speech fluent and appropriate Neurologic: grossly non-focal.          Labs on Admission:  Basic Metabolic Panel:  Recent Labs Lab 04/23/13 1212  NA 133*  K 4.4  CL 94*  CO2 26  GLUCOSE 156*  BUN 12  CREATININE 0.63  CALCIUM 8.9   Liver Function Tests: No results found for this basename: AST, ALT, ALKPHOS, BILITOT, PROT, ALBUMIN,  in the last 168 hours No results found for this basename: LIPASE, AMYLASE,  in the last 168 hours No results found for this basename: AMMONIA,  in the last 168 hours CBC:  Recent Labs Lab 04/23/13 1212  WBC 29.2*  NEUTROABS 27.4*  HGB 13.1  HCT 39.1  MCV 90.7  PLT 199   Cardiac Enzymes: No results found for this basename: CKTOTAL, CKMB, CKMBINDEX, TROPONINI,  in the last 168 hours  BNP (last 3 results) No results found for this basename: PROBNP,  in the last 8760 hours CBG: No results found for this basename: GLUCAP,  in the last 168 hours  Radiological Exams on Admission: Dg Chest 2 View (if Patient Has Fever And/or Copd)  04/23/2013   CLINICAL DATA:  One month history of chest pain and weakness and dyspnea  EXAM: CHEST  2 VIEW  COMPARISON:  PA and lateral chest x-ray at April 13, 2013.  FINDINGS: The lungs are mildly hyperinflated.  There is mildly increased density within the pulmonary interstitium bilaterally. The pulmonary vascularity is not clearly engorged. The cardiac silhouette is normal in size. There is no pleural effusion or pneumothorax. The mediastinum is normal in width. Old deformities of the left 6th, 7th, and 8th ribs are demonstrated.  IMPRESSION: Mildly increased interstitial markings in both lungs as compared to the previous study may reflect minimal subsegmental atelectasis or early interstitial pneumonia. There are findings consistent with underlying COPD. There is no evidence of CHF.   Electronically  Signed   By: David  [place]   On: 04/23/2013 12:55    EKG: Independently reviewed.   Assessment/Plan Active Problems:   Sepsis   Community acquired pneumonia   Respiratory failure   Hyponatremia   Leukocytosis   Pneumonia   1. Sepsis, present on admission, evidenced by a temperature 102.4, white count of 29,200, heart rate of 118. Likely source of infection community acquire pneumonia. Will start broad-spectrum IV antibiotic therapy with ceftriaxone 1 g IV q. 20 filed as an azithromycin 500 mg IV every 24 hours. Will obtain blood cultures and sputum cultures provide supportive care, IV fluid resuscitation. 2. Acute hypoxemic respiratory failure evidence by an O2 sat of 88% on room air. Likely secondary to community acquired pneumonia. Providing IV antibiotic therapy, supportive care, supplemental oxygen as needed.  3. Community acquire pneumonia. Patient presenting with a two-week history of cough, shortness of breath, sputum production. Failed outpatient course of doxycycline, will start azithromycin 500 mg IV every 24 hours and ceftriaxone 1 g IV every 24 hours. Followup on blood and sputum cultures. Repeat chest x-ray in a.m. Pending his flu swab at the time of this dictation. 4. Chronic obstructive pulmonary disease. I did not auscultate significant wheezing on exam. He had been on oral prednisone in the  outpatient setting. Will provide duonebs, hold off on systemic steroids for now, reassess in a.m. 5. Hyponatremia. Lab work showed a sodium level of 133, could be secondary to dehydration in setting of pneumonia. Providing IV fluids with normal saline. 6. DVT prophylaxis. Lovenox    Code Status: Full code Disposition Plan: I anticipate he will require greater than 2 night hospitalization  Time spent: 70 minutes  Kelvin Cellar [facility] Pager [phone]

## 2013-04-24 DIAGNOSIS — J111 Influenza due to unidentified influenza virus with other respiratory manifestations: Secondary | ICD-10-CM

## 2013-04-24 LAB — BASIC METABOLIC PANEL
BUN: 12 mg/dL (ref 6–23)
CALCIUM: 8.4 mg/dL (ref 8.4–10.5)
CHLORIDE: 97 meq/L (ref 96–112)
CO2: 25 mEq/L (ref 19–32)
Creatinine, Ser: 0.65 mg/dL (ref 0.50–1.35)
GFR calc Af Amer: >90 mL/min (ref >90)
GFR calc non Af Amer: >90 mL/min (ref >90)
GLUCOSE: 143 mg/dL — AB (ref 70–99)
Potassium: 3.8 mEq/L (ref 3.7–5.3)
Sodium: 134 mEq/L — ABNORMAL LOW (ref 137–147)

## 2013-04-24 LAB — LEGIONELLA ANTIGEN, URINE: LEGIONELLA ANTIGEN, URINE: NEGATIVE

## 2013-04-24 LAB — CBC
HEMATOCRIT: 36.2 % — AB (ref 39.0–52.0)
Hemoglobin: 11.9 g/dL — ABNORMAL LOW (ref 13.0–17.0)
MCH: 30 pg (ref 26.0–34.0)
MCHC: 32.9 g/dL (ref 30.0–36.0)
MCV: 91.2 fL (ref 78.0–100.0)
PLATELETS: 184 10*3/uL (ref 150–400)
RBC: 3.97 MIL/uL — ABNORMAL LOW (ref 4.22–5.81)
RDW: 13.6 % (ref 11.5–15.5)
WBC: 15 10*3/uL — ABNORMAL HIGH (ref 4.0–10.5)

## 2013-04-24 LAB — HIV ANTIBODY (ROUTINE TESTING W REFLEX): HIV: NONREACTIVE

## 2013-04-24 MED ORDER — OSELTAMIVIR PHOSPHATE 75 MG PO CAPS
75.0000 mg | ORAL_CAPSULE | Freq: Two times a day (BID) | ORAL | Status: DC
  Administered 2013-04-24 – 2013-04-27 (×7): 75 mg via ORAL
  Filled 2013-04-24 (×8): qty 1

## 2013-04-24 MED ORDER — IPRATROPIUM-ALBUTEROL 0.5-2.5 (3) MG/3ML IN SOLN
3.0000 mL | Freq: Three times a day (TID) | RESPIRATORY_TRACT | Status: DC
  Administered 2013-04-25 – 2013-04-27 (×8): 3 mL via RESPIRATORY_TRACT
  Filled 2013-04-24 (×9): qty 3

## 2013-04-24 NOTE — Progress Notes (Signed)
[facility] PROGRESS NOTE  [name] [name] QVZ:[id number] DOB: 09/21/1940 DOA: 04/23/2013 PCP: Robyn Haber, MD  Assessment/Plan: Sepsis -Present at the time of admission -Secondary to atypical pneumonia and influenza -Continue IV fluids Acute respiratory failure -Continue supplemental oxygen -Aerosolized albuterol and Atrovent Influenza -Although the patient may be outside the 48h window of starting Tamiflu, the patient has risk factors for complication and is hospitalized -As a result, Start Tamiflu Community-acquired pneumonia -Given the patient's presentation and chest x-ray findings, likely an atypical -Continue ceftriaxone and azithromycin COPD -Stable -Supplement oxygen Hyponatremia -Volume depletion -IV fluids  Family Communication:   Pt at beside Disposition Plan:   Home when medically stable  Antibiotics:  Ceftriaxone 04/23/2013>>>  Azithromycin 04/23/2013>>>  Tamiflu 04/24/2013>>>    Procedures/Studies: Dg Chest 2 View (if Patient Has Fever And/or Copd)  04/23/2013   CLINICAL DATA:  One month history of chest pain and weakness and dyspnea  EXAM: CHEST  2 VIEW  COMPARISON:  PA and lateral chest x-ray at April 13, 2013.  FINDINGS: The lungs are mildly hyperinflated. There is mildly increased density within the pulmonary interstitium bilaterally. The pulmonary vascularity is not clearly engorged. The cardiac silhouette is normal in size. There is no pleural effusion or pneumothorax. The mediastinum is normal in width. Old deformities of the left 6th, 7th, and 8th ribs are demonstrated.  IMPRESSION: Mildly increased interstitial markings in both lungs as compared to the previous study may reflect minimal subsegmental atelectasis or early interstitial pneumonia. There are findings consistent with underlying COPD. There is no evidence of CHF.   Electronically Signed   By: Mercer Peifer  [place]   On: 04/23/2013 12:55   Dg Chest 2 View  04/13/2013   CLINICAL DATA:   Shortness of breath.  EXAM: CHEST  2 VIEW  COMPARISON:  Chest CT 02/05/2012.  Radiographs 07/30/2011.  FINDINGS: The heart size and mediastinal contours are stable. There is stable mild chronic lung disease. No airspace disease, pleural effusion or pneumothorax is identified. Old rib fractures are again noted on the left.  IMPRESSION: Stable chest with stable chronic lung disease. No acute cardiopulmonary process identified.   Electronically Signed   By: Camie Patience M.D.   On: 04/13/2013 19:38         Subjective: Patient is breathing a little bit better. He still has dyspnea on exertion. He continues to have and cough without hemoptysis. Denies any fevers, chills, chest pain, nausea, vomiting, diarrhea, abdominal pain.  Objective: Filed Vitals:   04/24/13 0038 04/24/13 0407 04/24/13 0554 04/24/13 0740  BP:   116/57   Pulse: 120 106 105   Temp:   99 F (37.2 C)   TempSrc:   Oral   Resp: 18 20 20    Height:      Weight:      SpO2:  96% 96% 98%    Intake/Output Summary (Last 24 hours) at 04/24/13 0941 Last data filed at 04/24/13 0500  Gross per 24 hour  Intake  78.33 ml  Output    800 ml  Net -721.67 ml   Weight change:  Exam:   General:  Pt is alert, follows commands appropriately, not in acute distress  HEENT: No icterus, No thrush,  /AT  Cardiovascular: RRR, S1/S2, no rubs, no gallops  Respiratory: Bilateral rales, right greater than left. No wheezing. Good air movement.  Abdomen: Soft/+BS, non tender, non distended, no guarding  Extremities: No edema, No lymphangitis, No petechiae, No rashes, no synovitis  Data Reviewed: Basic Metabolic Panel:  Recent Labs Lab 04/23/13 1212 04/24/13 0542  NA 133* 134*  K 4.4 3.8  CL 94* 97  CO2 26 25  GLUCOSE 156* 143*  BUN 12 12  CREATININE 0.63 0.65  CALCIUM 8.9 8.4   Liver Function Tests: No results found for this basename: AST, ALT, ALKPHOS, BILITOT, PROT, ALBUMIN,  in the last 168 hours No results found for  this basename: LIPASE, AMYLASE,  in the last 168 hours No results found for this basename: AMMONIA,  in the last 168 hours CBC:  Recent Labs Lab 04/23/13 1212 04/24/13 0542  WBC 29.2* 15.0*  NEUTROABS 27.4*  --   HGB 13.1 11.9*  HCT 39.1 36.2*  MCV 90.7 91.2  PLT 199 184   Cardiac Enzymes: No results found for this basename: CKTOTAL, CKMB, CKMBINDEX, TROPONINI,  in the last 168 hours BNP: No components found with this basename: POCBNP,  CBG: No results found for this basename: GLUCAP,  in the last 168 hours  No results found for this or any previous visit (from the past 240 hour(s)).   Scheduled Meds: . azithromycin  500 mg Intravenous Q24H  . cefTRIAXone (ROCEPHIN)  IV  1 g Intravenous Q24H  . enoxaparin (LOVENOX) injection  40 mg Subcutaneous Q24H  . ipratropium-albuterol  3 mL Nebulization Q4H  . oseltamivir  75 mg Oral BID   Continuous Infusions: . sodium chloride 100 mL/hr at 04/24/13 0811     Shafer Swamy, DO  [facility] Pager [phone]  If 7PM-7AM, please contact night-coverage [email or web address] Password [email or web address] 04/24/2013, 9:41 AM   LOS: 1 day

## 2013-04-25 LAB — BASIC METABOLIC PANEL
BUN: 11 mg/dL (ref 6–23)
CALCIUM: 8.6 mg/dL (ref 8.4–10.5)
CO2: 26 mEq/L (ref 19–32)
CREATININE: 0.53 mg/dL (ref 0.50–1.35)
Chloride: 100 mEq/L (ref 96–112)
GLUCOSE: 109 mg/dL — AB (ref 70–99)
POTASSIUM: 3.6 meq/L — AB (ref 3.7–5.3)
Sodium: 136 mEq/L — ABNORMAL LOW (ref 137–147)

## 2013-04-25 LAB — CBC
HCT: 33.6 % — ABNORMAL LOW (ref 39.0–52.0)
HEMOGLOBIN: 11 g/dL — AB (ref 13.0–17.0)
MCH: 29.6 pg (ref 26.0–34.0)
MCHC: 32.7 g/dL (ref 30.0–36.0)
MCV: 90.3 fL (ref 78.0–100.0)
Platelets: 171 10*3/uL (ref 150–400)
RBC: 3.72 MIL/uL — ABNORMAL LOW (ref 4.22–5.81)
RDW: 13.7 % (ref 11.5–15.5)
WBC: 9.2 10*3/uL (ref 4.0–10.5)

## 2013-04-25 MED ORDER — AZITHROMYCIN 500 MG PO TABS
500.0000 mg | ORAL_TABLET | ORAL | Status: DC
  Administered 2013-04-25 – 2013-04-26 (×2): 500 mg via ORAL
  Filled 2013-04-25 (×3): qty 1

## 2013-04-25 NOTE — Discharge Summary (Signed)
Physician Discharge Summary  [name] [name] [id number]:[id number] DOB: [birth year] DOA: 04/23/2013  PCP: Robyn Haber, MD  Admit date: 04/23/2013 Discharge date: 04/27/13 Recommendations for Outpatient Follow-up:  1. Pt will need to follow up with PCP in 2 weeks post discharge 2. Please obtain BMP to evaluate electrolytes and kidney function 3. Please also check CBC to evaluate Hg and Hct levels  Discharge Diagnoses:  Active Problems:   Community acquired pneumonia   Respiratory failure   Hyponatremia   Leukocytosis   Sepsis   Pneumonia   Influenza with respiratory manifestations Sepsis  -Present at the time of admission  -Secondary to atypical pneumonia and influenza  -Continue IV fluids  -Blood cultures remained negative Acute respiratory failure  -Continue supplemental oxygen--weaned to room air -Aerosolized albuterol and Atrovent  Influenza  -Although the patient may be outside the 48h window of starting Tamiflu, the patient has risk factors for complication and is hospitalized  -As a result, Start Tamiflu--patient will have 2 additional days at the time of discharge to complete 5 days total  Community-acquired pneumonia  -Given the patient's presentation and chest x-ray findings, likely an atypical  -Continue ceftriaxone and azithromycin  -Urine Legionella and urine Streptococcus pneumoniae antigen negative  -Patient will go home with cefpodoxime and azithromycin for 3 additional days to complete 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 Marin Olp in office for these results -CT chest, abdomen and pelvis also ordered by Dr. Filomena Jungling will follow up in the office for these results. COPD  -Stable  -Supplement oxygen--weaned to room air  -Ambulatory pulse oximetry did not reveal oxygen desaturation -Patient  has greater than 60-pack-year history Hyponatremia  -Volume depletion  -IV fluids--improved  Deconditioning  -PT evaluation recommended HHPT which was set up by case manager Family Communication: Pt at beside  Disposition Plan: Home vs SNF  Antibiotics:  Ceftriaxone 04/23/2013>>> 04/26/13 Azithromycin 04/23/2013>>>  Tamiflu 04/24/2013>>>   Discharge Condition: Stable  Disposition:  Follow-up Information   Follow up with Volanda Napoleon, MD In 3 weeks.   Specialty:  Oncology   Contact information:   [address], [address] [place] [address] [phone]      skilled nursing facility  Diet: Regular Wt Readings from Last 3 Encounters:  04/23/13 71.8 kg (158 lb 4.6 oz)  04/13/13 74.844 kg (165 lb)  04/01/13 77.111 kg (170 lb)    History of present illness:  73 y.o. male with a past medical history of chronic obstructive pulmonary disease, who presents to the emergency department with complaints of cough/shortness of breath. He states symptoms started approximately 2 weeks ago, having a productive cough with yellow sputum production, with associated shortness of breath, subjective fevers and chills, becoming progressively worse. He also complains of night sweats, nasal congestion, sore throat, generalized weakness, fatigue, malaise and poor tolerance to physical exertion. He was administered a dose of ceftriaxone at the urgent care Center on 04/13/2013. Patient has also been on steroids and doxycycline as well. Despite these interventions his symptoms have progressively worsened. He reports becoming so weak this morning he was unable to get out of bed. A chest x-ray in the emergency department today showed mild increase in interstitial markings in both lungs which could reflect atelectasis and or early pneumonia. Initial lab work showed an elevated white count of 29,200 as he was found to be febrile have  a temperature 102.4. In the emergency department he was administered a  dose of age azithromycin and ceftriaxone. Influenza PCR was obtained and was positive. Because of the patient's high risk for complications, the patient was started on Tamiflu despite him having the symptoms for greater than 48 hours.     Discharge Exam: Filed Vitals:   04/27/13 0955  BP: 150/70  Pulse: 91  Temp:   Resp: 20   Filed Vitals:   04/27/13 0912 04/27/13 0915 04/27/13 0930 04/27/13 0955  BP:  142/70 140/72 150/70  Pulse:  93 92 91  Temp:      TempSrc:      Resp:  _0 Height:      Weight:      SpO2: 94%   95%   General: A&O x 3, NAD, pleasant, cooperative Cardiovascular: RRR, no rub, no gallop, no S3 Respiratory: Bibasilar rales, right greater than left. No wheezing. Good air movement.  Abdomen:soft, nontender, nondistended, positive bowel sounds Extremities: trace edema, No lymphangitis, no petechiae  Discharge Instructions      Discharge Orders   Future Appointments Provider Department Dept Phone   04/27/2013 11:55 PM [facility]  [facility] [phone]   Liquids only 4 hours prior to your exam. Any medications can be taken as usual. Please arrive 15 min prior to your scheduled exam time.   07/01/2013 1:15 PM Gwendolyn A Kevin [facility] [place] [phone]   07/01/2013 1:45 PM Volanda Napoleon, MD [place] [phone]   Future Orders Complete By Expires   Diet general  As directed    Increase activity slowly  As directed        Medication List         azithromycin 500 MG tablet  Commonly known as:  ZITHROMAX  Take 1 tablet (500 mg total) by mouth daily.     benzonatate 100 MG capsule  Commonly known as:  TESSALON  Take 1 capsule (100 mg total) by mouth 3 (three) times daily as needed for cough.     cefpodoxime 200 MG tablet  Commonly known as:  VANTIN  Take 1 tablet (200 mg total) by mouth every 12 (twelve) hours.     fish oil-omega-3 fatty acids 1000 MG capsule   Take 1 g by mouth daily.     Ipratropium-Albuterol 20-100 MCG/ACT Aers respimat  Commonly known as:  COMBIVENT RESPIMAT  Inhale 1 puff into the lungs every 6 (six) hours.     oseltamivir 75 MG capsule  Commonly known as:  TAMIFLU  Take 1 capsule (75 mg total) by mouth 2 (two) times daily.     traMADol 50 MG tablet  Commonly known as:  ULTRAM  Take 1 tablet (50 mg total) by mouth every 6 (six) hours as needed.     VITAMIN B 12 PO  Take by mouth every morning.         The results of significant diagnostics from this hospitalization (including imaging, microbiology, ancillary and laboratory) are listed below for reference.    Significant Diagnostic Studies: Dg Chest 2 View (if Patient Has Fever And/or Copd)  04/23/2013   CLINICAL DATA:  One month history of chest pain and weakness and dyspnea  EXAM: CHEST  2 VIEW  COMPARISON:  PA and lateral chest x-ray at April 13, 2013.  FINDINGS: The lungs are mildly hyperinflated. There is mildly increased density within the pulmonary interstitium bilaterally. The pulmonary  vascularity is not clearly engorged. The cardiac silhouette is normal in size. There is no pleural effusion or pneumothorax. The mediastinum is normal in width. Old deformities of the left 6th, 7th, and 8th ribs are demonstrated.  IMPRESSION: Mildly increased interstitial markings in both lungs as compared to the previous study may reflect minimal subsegmental atelectasis or early interstitial pneumonia. There are findings consistent with underlying COPD. There is no evidence of CHF.   Electronically Signed   By: Shafiq Larch  [place]   On: 04/23/2013 12:55   Dg Chest 2 View  04/13/2013   CLINICAL DATA:  Shortness of breath.  EXAM: CHEST  2 VIEW  COMPARISON:  Chest CT 02/05/2012.  Radiographs 07/30/2011.  FINDINGS: The heart size and mediastinal contours are stable. There is stable mild chronic lung disease. No airspace disease, pleural effusion or pneumothorax is identified. Old rib  fractures are again noted on the left.  IMPRESSION: Stable chest with stable chronic lung disease. No acute cardiopulmonary process identified.   Electronically Signed   By: Camie Patience M.D.   On: 04/13/2013 19:38     Microbiology: Recent Results (from the past 240 hour(s))  CULTURE, BLOOD (ROUTINE X 2)     Status: None   Collection Time    04/23/13  1:12 PM      Result Value Range Status   Specimen Description BLOOD LEFT FOREARM   Final   Special Requests BOTTLES DRAWN AEROBIC AND ANAEROBIC 5ML   Final   Culture  Setup Time     Final   Value: 04/23/2013 20:26     Performed at [facility]   Culture     Final   Value:        BLOOD CULTURE RECEIVED NO GROWTH TO DATE CULTURE WILL BE HELD FOR 5 DAYS BEFORE ISSUING A FINAL NEGATIVE REPORT     Performed at [facility]   Report Status PENDING   Incomplete  CULTURE, BLOOD (ROUTINE X 2)     Status: None   Collection Time    04/23/13  3:12 PM      Result Value Range Status   Specimen Description BLOOD RIGHT ANTECUBITAL   Final   Special Requests BOTTLES DRAWN AEROBIC AND ANAEROBIC 4CC   Final   Culture  Setup Time     Final   Value: 04/23/2013 20:26     Performed at [facility]   Culture     Final   Value:        BLOOD CULTURE RECEIVED NO GROWTH TO DATE CULTURE WILL BE HELD FOR 5 DAYS BEFORE ISSUING A FINAL NEGATIVE REPORT     Performed at [facility]   Report Status PENDING   Incomplete     Labs: Basic Metabolic Panel:  Recent Labs Lab 04/23/13 1212 04/24/13 0542 04/25/13 0510 04/27/13 0433  NA 133* 134* 136* 137  K 4.4 3.8 3.6* 3.2*  CL 94* 97 100 98  CO2 _0 GLUCOSE 156* 143* 109* 97  BUN _1 5*  CREATININE 0.63 0.65 0.53 0.49*  CALCIUM 8.9 8.4 8.6 8.0*   Liver Function Tests: No results found for this basename: AST, ALT, ALKPHOS, BILITOT, PROT, ALBUMIN,  in the last 168 hours No results found for this basename: LIPASE, AMYLASE,  in the last 168 hours No results found  for this basename: AMMONIA,  in the last 168 hours CBC:  Recent Labs Lab 04/23/13 1212 04/24/13 0542 04/25/13 0510 04/27/13 0433  WBC 29.2* 15.0* 9.2 5.8  NEUTROABS 27.4*  --   --   --   HGB 13.1 11.9* 11.0* 10.6*  HCT 39.1 36.2* 33.6* 32.4*  MCV 90.7 91.2 90.3 90.0  PLT 199 184 171 188   Cardiac Enzymes: No results found for this basename: CKTOTAL, CKMB, CKMBINDEX, TROPONINI,  in the last 168 hours BNP: No components found with this basename: POCBNP,  CBG: No results found for this basename: GLUCAP,  in the last 168 hours  Time coordinating discharge:  Greater than 30 minutes  Signed:  Chima Astorino, DO [facility] Pager: [phone] 04/27/2013, 11:11 AM

## 2013-04-25 NOTE — Progress Notes (Signed)
[facility] PROGRESS NOTE  EISSA BUCHBERGER ZOX:[id number] DOB: [birth year] DOA: 04/23/2013 PCP: Robyn Haber, MD  Assessment/Plan: Sepsis  -Present at the time of admission  -Secondary to atypical pneumonia and influenza  -Continue IV fluids  Acute respiratory failure  -Continue supplemental oxygen  -Aerosolized albuterol and Atrovent  Influenza  -Although the patient may be outside the 48h window of starting Tamiflu, the patient has risk factors for complication and is hospitalized  -As a result, Start Tamiflu  Community-acquired pneumonia  -Given the patient's presentation and chest x-ray findings, likely an atypical  -Continue ceftriaxone and azithromycin  -Urine Legionella and urine Streptococcus pneumoniae antigen negative COPD  -Stable  -Supplement oxygen  Hyponatremia  -Volume depletion  -IV fluids  Deconditioning -PT evaluation Family Communication: Pt at beside  Disposition Plan: Home vs SNF Antibiotics:  Ceftriaxone 04/23/2013>>>  Azithromycin 04/23/2013>>>  Tamiflu 04/24/2013>>>          Procedures/Studies: Dg Chest 2 View (if Patient Has Fever And/or Copd)  04/23/2013   CLINICAL DATA:  One month history of chest pain and weakness and dyspnea  EXAM: CHEST  2 VIEW  COMPARISON:  PA and lateral chest x-ray at April 13, 2013.  FINDINGS: The lungs are mildly hyperinflated. There is mildly increased density within the pulmonary interstitium bilaterally. The pulmonary vascularity is not clearly engorged. The cardiac silhouette is normal in size. There is no pleural effusion or pneumothorax. The mediastinum is normal in width. Old deformities of the left 6th, 7th, and 8th ribs are demonstrated.  IMPRESSION: Mildly increased interstitial markings in both lungs as compared to the previous study may reflect minimal subsegmental atelectasis or early interstitial pneumonia. There are findings consistent with underlying COPD. There is no evidence of CHF.    Electronically Signed   By: Yoshiko Keleher  [place]   On: 04/23/2013 12:55   Dg Chest 2 View  04/13/2013   CLINICAL DATA:  Shortness of breath.  EXAM: CHEST  2 VIEW  COMPARISON:  Chest CT 02/05/2012.  Radiographs 07/30/2011.  FINDINGS: The heart size and mediastinal contours are stable. There is stable mild chronic lung disease. No airspace disease, pleural effusion or pneumothorax is identified. Old rib fractures are again noted on the left.  IMPRESSION: Stable chest with stable chronic lung disease. No acute cardiopulmonary process identified.   Electronically Signed   By: Camie Patience M.D.   On: 04/13/2013 19:38         Subjective: Patient is breathing better. He states his cough is improved. He complains of generalized weakness. Denies any fevers, chills, chest pain, nausea, vomiting, diarrhea, abdominal pain.  Objective: Filed Vitals:   04/24/13 1918 04/24/13 1956 04/25/13 0612 04/25/13 0745  BP:  128/54 113/57   Pulse: 105 115 90   Temp:  100.5 F (38.1 C) 98.3 F (36.8 C)   TempSrc:  Oral Oral   Resp: 20 22 20    Height:      Weight:      SpO2: 97% 94% 98% 98%    Intake/Output Summary (Last 24 hours) at 04/25/13 1426 Last data filed at 04/25/13 0612  Gross per 24 hour  Intake 1916.67 ml  Output    525 ml  Net 1391.67 ml   Weight change:  Exam:   General:  Pt is alert, follows commands appropriately, not in acute distress  HEENT: No icterus, No thrush,  [place]/AT  Cardiovascular: RRR, S1/S2, no rubs, no gallops  Respiratory: Bilateral rales, left greater than right. There is present at bases. No wheezing.  Abdomen: Soft/+BS, non tender, non distended, no guarding  Extremities: No edema, No lymphangitis, No petechiae, No rashes, no synovitis  Data Reviewed: Basic Metabolic Panel:  Recent Labs Lab 04/23/13 1212 04/24/13 0542 04/25/13 0510  NA 133* 134* 136*  K 4.4 3.8 3.6*  CL 94* 97 100  CO2 26 25 26   GLUCOSE 156* 143* 109*  BUN 12 12 11   CREATININE 0.63 0.65  0.53  CALCIUM 8.9 8.4 8.6   Liver Function Tests: No results found for this basename: AST, ALT, ALKPHOS, BILITOT, PROT, ALBUMIN,  in the last 168 hours No results found for this basename: LIPASE, AMYLASE,  in the last 168 hours No results found for this basename: AMMONIA,  in the last 168 hours CBC:  Recent Labs Lab 04/23/13 1212 04/24/13 0542 04/25/13 0510  WBC 29.2* 15.0* 9.2  NEUTROABS 27.4*  --   --   HGB 13.1 11.9* 11.0*  HCT 39.1 36.2* 33.6*  MCV 90.7 91.2 90.3  PLT 199 184 171   Cardiac Enzymes: No results found for this basename: CKTOTAL, CKMB, CKMBINDEX, TROPONINI,  in the last 168 hours BNP: No components found with this basename: POCBNP,  CBG: No results found for this basename: GLUCAP,  in the last 168 hours  Recent Results (from the past 240 hour(s))  CULTURE, BLOOD (ROUTINE X 2)     Status: None   Collection Time    04/23/13  1:12 PM      Result Value Range Status   Specimen Description BLOOD LEFT FOREARM   Final   Special Requests BOTTLES DRAWN AEROBIC AND ANAEROBIC 5ML   Final   Culture  Setup Time     Final   Value: 04/23/2013 20:26     Performed at [facility]   Culture     Final   Value:        BLOOD CULTURE RECEIVED NO GROWTH TO DATE CULTURE WILL BE HELD FOR 5 DAYS BEFORE ISSUING A FINAL NEGATIVE REPORT     Performed at [facility]   Report Status PENDING   Incomplete  CULTURE, BLOOD (ROUTINE X 2)     Status: None   Collection Time    04/23/13  3:12 PM      Result Value Range Status   Specimen Description BLOOD RIGHT ANTECUBITAL   Final   Special Requests BOTTLES DRAWN AEROBIC AND ANAEROBIC 4CC   Final   Culture  Setup Time     Final   Value: 04/23/2013 20:26     Performed at [facility]   Culture     Final   Value:        BLOOD CULTURE RECEIVED NO GROWTH TO DATE CULTURE WILL BE HELD FOR 5 DAYS BEFORE ISSUING A FINAL NEGATIVE REPORT     Performed at [facility]   Report Status PENDING   Incomplete      Scheduled Meds: . azithromycin  500 mg Oral Q24H  . cefTRIAXone (ROCEPHIN)  IV  1 g Intravenous Q24H  . enoxaparin (LOVENOX) injection  40 mg Subcutaneous Q24H  . ipratropium-albuterol  3 mL Nebulization TID  . oseltamivir  75 mg Oral BID   Continuous Infusions: . sodium chloride 100 mL/hr (04/25/13 1157)     Declan Adamson, DO  [facility] Pager [phone]  If 7PM-7AM, please contact night-coverage [email or web address] Password [email or web address] 04/25/2013, 2:26 PM   LOS: 2 days

## 2013-04-26 ENCOUNTER — Inpatient Hospital Stay (HOSPITAL_COMMUNITY): Payer: Medicare Other

## 2013-04-26 ENCOUNTER — Other Ambulatory Visit (HOSPITAL_COMMUNITY): Payer: Medicare Other

## 2013-04-26 DIAGNOSIS — C8589 Other specified types of non-Hodgkin lymphoma, extranodal and solid organ sites: Secondary | ICD-10-CM

## 2013-04-26 DIAGNOSIS — J189 Pneumonia, unspecified organism: Secondary | ICD-10-CM

## 2013-04-26 DIAGNOSIS — F1010 Alcohol abuse, uncomplicated: Secondary | ICD-10-CM

## 2013-04-26 MED ORDER — ENOXAPARIN SODIUM 40 MG/0.4ML ~~LOC~~ SOLN
40.0000 mg | SUBCUTANEOUS | Status: DC
  Administered 2013-04-26: 40 mg via SUBCUTANEOUS
  Filled 2013-04-26 (×2): qty 0.4

## 2013-04-26 NOTE — Progress Notes (Signed)
[facility] PROGRESS NOTE  [name] [name] UDJ:[id number] DOB: 1940-11-26 DOA: 04/23/2013 PCP: Robyn Haber, MD  Assessment/Plan: Sepsis  -Present at the time of admission  -Secondary to atypical pneumonia and influenza  -Continue IV fluids  -Blood cultures remained negative  Acute respiratory failure  -supplemental oxygen---> to remain a  -Aerosolized albuterol and Atrovent  Influenza  -Although the patient may be outside the 48h window of starting Tamiflu, the patient has risk factors for complication and is hospitalized  -As a result, Start Tamiflu--patient will have 3 additional days at the time of discharge to complete 5 days total  Community-acquired pneumonia  -Given the patient's presentation and chest x-ray findings, likely an atypical  -Continue ceftriaxone and azithromycin--plan to finish one week of antibiotics--can go home with cefpodoxime and azithromycin  -Urine Legionella and urine Streptococcus pneumoniae antigen negative - Home after bone marrow on 04/27/13  lymphoplasmacytic lymphoma, -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 Marin Olp -scheduled 04/27/13 COPD  -Stable  -Supplement oxygen  -Ambulatory pulse oximetry did not reveal oxygen desaturation  -Patient has greater than 60-pack-year history  Hyponatremia  -Volume depletion  -IV fluids  Deconditioning  -PT evaluation recommended HHPT Family Communication: Pt at beside  Disposition Plan: Home after bone marrow on 04/27/13 Antibiotics:  Ceftriaxone 04/23/2013>>>  Azithromycin 04/23/2013>>>  Tamiflu 04/24/2013>>>         Procedures/Studies: Dg Chest 2 View (if Patient Has Fever And/or Copd)  04/23/2013   CLINICAL DATA:  One month history of chest pain and weakness and dyspnea  EXAM: CHEST  2 VIEW  COMPARISON:  PA and lateral chest x-ray at April 13, 2013.  FINDINGS: The lungs are mildly hyperinflated. There is mildly increased density within the pulmonary interstitium bilaterally. The pulmonary  vascularity is not clearly engorged. The cardiac silhouette is normal in size. There is no pleural effusion or pneumothorax. The mediastinum is normal in width. Old deformities of the left 6th, 7th, and 8th ribs are demonstrated.  IMPRESSION: Mildly increased interstitial markings in both lungs as compared to the previous study may reflect minimal subsegmental atelectasis or early interstitial pneumonia. There are findings consistent with underlying COPD. There is no evidence of CHF.   Electronically Signed   By: Shakisha Abend  [place]   On: 04/23/2013 12:55   Dg Chest 2 View  04/13/2013   CLINICAL DATA:  Shortness of breath.  EXAM: CHEST  2 VIEW  COMPARISON:  Chest CT 02/05/2012.  Radiographs 07/30/2011.  FINDINGS: The heart size and mediastinal contours are stable. There is stable mild chronic lung disease. No airspace disease, pleural effusion or pneumothorax is identified. Old rib fractures are again noted on the left.  IMPRESSION: Stable chest with stable chronic lung disease. No acute cardiopulmonary process identified.   Electronically Signed   By: Camie Patience M.D.   On: 04/13/2013 19:38         Subjective: Patient has intermittent shortness of breath with nonproductive cough. Denies fevers, chills, chest pain, nausea, vomiting, diarrhea. He had a bowel movement yesterday.  Objective: Filed Vitals:   04/25/13 2114 04/26/13 0555 04/26/13 0815 04/26/13 1421  BP: 117/59 128/66  109/59  Pulse: 106 93  89  Temp: 98.7 F (37.1 C) 98 F (36.7 C)  97.8 F (36.6 C)  TempSrc: Oral Oral  Oral  Resp: _0 Height:      Weight:      SpO2: 95% 96% 92% 94%    Intake/Output Summary (Last 24 hours) at 04/26/13 1826 Last data filed  at 04/26/13 1700  Gross per 24 hour  Intake   3015 ml  Output    550 ml  Net   2465 ml   Weight change:  Exam:   General:  Pt is alert, follows commands appropriately, not in acute distress  HEENT: No icterus, No thrush,  [place]/AT  Cardiovascular: RRR, S1/S2,  no rubs, no gallops  Respiratory: Bibasilar rales without any wheezing.  Abdomen: Soft/+BS, non tender, non distended, no guarding  Extremities: trace LE edema, No lymphangitis, No petechiae, No rashes, no synovitis  Data Reviewed: Basic Metabolic Panel:  Recent Labs Lab 04/23/13 1212 04/24/13 0542 04/25/13 0510  NA 133* 134* 136*  K 4.4 3.8 3.6*  CL 94* 97 100  CO2 _0 GLUCOSE 156* 143* 109*  BUN _1 CREATININE 0.63 0.65 0.53  CALCIUM 8.9 8.4 8.6   Liver Function Tests: No results found for this basename: AST, ALT, ALKPHOS, BILITOT, PROT, ALBUMIN,  in the last 168 hours No results found for this basename: LIPASE, AMYLASE,  in the last 168 hours No results found for this basename: AMMONIA,  in the last 168 hours CBC:  Recent Labs Lab 04/23/13 1212 04/24/13 0542 04/25/13 0510  WBC 29.2* 15.0* 9.2  NEUTROABS 27.4*  --   --   HGB 13.1 11.9* 11.0*  HCT 39.1 36.2* 33.6*  MCV 90.7 91.2 90.3  PLT 199 184 171   Cardiac Enzymes: No results found for this basename: CKTOTAL, CKMB, CKMBINDEX, TROPONINI,  in the last 168 hours BNP: No components found with this basename: POCBNP,  CBG: No results found for this basename: GLUCAP,  in the last 168 hours  Recent Results (from the past 240 hour(s))  CULTURE, BLOOD (ROUTINE X 2)     Status: None   Collection Time    04/23/13  1:12 PM      Result Value Range Status   Specimen Description BLOOD LEFT FOREARM   Final   Special Requests BOTTLES DRAWN AEROBIC AND ANAEROBIC 5ML   Final   Culture  Setup Time     Final   Value: 04/23/2013 20:26     Performed at [facility]   Culture     Final   Value:        BLOOD CULTURE RECEIVED NO GROWTH TO DATE CULTURE WILL BE HELD FOR 5 DAYS BEFORE ISSUING A FINAL NEGATIVE REPORT     Performed at [facility]   Report Status PENDING   Incomplete  CULTURE, BLOOD (ROUTINE X 2)     Status: None   Collection Time    04/23/13  3:12 PM      Result Value Range Status    Specimen Description BLOOD RIGHT ANTECUBITAL   Final   Special Requests BOTTLES DRAWN AEROBIC AND ANAEROBIC 4CC   Final   Culture  Setup Time     Final   Value: 04/23/2013 20:26     Performed at [facility]   Culture     Final   Value:        BLOOD CULTURE RECEIVED NO GROWTH TO DATE CULTURE WILL BE HELD FOR 5 DAYS BEFORE ISSUING A FINAL NEGATIVE REPORT     Performed at [facility]   Report Status PENDING   Incomplete     Scheduled Meds: . azithromycin  500 mg Oral Q24H  . cefTRIAXone (ROCEPHIN)  IV  1 g Intravenous Q24H  . enoxaparin (LOVENOX) injection  40 mg Subcutaneous Q24H  .  ipratropium-albuterol  3 mL Nebulization TID  . oseltamivir  75 mg Oral BID   Continuous Infusions: . sodium chloride 100 mL/hr at 04/26/13 1538     Shekina Cordell, DO  [facility] Pager [phone]  If 7PM-7AM, please contact night-coverage [email or web address] Password [facility] 04/26/2013, 6:26 PM   LOS: 3 days

## 2013-04-26 NOTE — Care Management Note (Addendum)
    Page 1 of 2   04/27/2013     11:15:02 AM   CARE MANAGEMENT NOTE 04/27/2013  Patient:  TRIPTON, NED   Account Number:  [id number]  Date Initiated:  04/26/2013  Documentation initiated by:  Dessa Phi  Subjective/Objective Assessment:   73 Y/O M ADMITTED W/PNA.HB:[id number]     Action/Plan:   FROM HOME.HAS PCP,PHARMACY.   Anticipated DC Date:  04/27/2013   Anticipated DC Plan:  [place]  CM consult      Choice offered to / List presented to:          [facility] arranged  HH-1 RN  Ama.   Status of service:  Completed, signed off Medicare Important Message given?   (If response is "NO", the following Medicare IM given date fields will be blank) Date Medicare IM given:   Date Additional Medicare IM given:    Discharge Disposition:  [place]  Per UR Regulation:  Reviewed for med. necessity/level of care/duration of stay  If discussed at Long Length of Stay Meetings, dates discussed:    Comments:  04/27/13 Stephnie Parlier RN,BSN NCM [phone] HHRN/PT ORDER,& D/C TODAY.  04/26/13 Thailan Sava RN,BSN NCM [phone] PER PATIENT DEFER TO BETTY,TC HCPOA PER NSG BETTY FYBO,FBP#[phone],EUMP SHE IS HIS EX WIFE,& LIVES AROUND THE CORNER,BUT PATIENT SON STAYS W/HIM DURING THE DAY WHEN NOT WORKING,& BETTY CHECKS ON HIM.INFORMED HER THAT HHPT WAS RECOMMENDED.HE WANTS HIM IN A REHAB FACILITY,BUT I EXPLAINED THAT CURRENTLY HE ONLY QUALIFIES FOR HHC.SHE VOICED UNDERSTANDING & WAS PLEASED TO GET THESE SERVICES.I RECOMMEND HHRN-MEDS,& DISEASE MGMNT.[facility] CHOSEN FOR HHC,TC KRISTEN [facility] REP TO FOLLOW FOR HHRN/PT ORDER & D/C.  PT-HH.[facility] [place] AGENCY LIST.

## 2013-04-26 NOTE — Progress Notes (Signed)
Patient ID: [name] [name], male   DOB: [birth year], 73 y.o.   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 Additional PMH as below. Exam: pt awake/alert; chest- distant BS throughout, few basilar crackles; heart- RRR; abd- soft,+BS,NT; ext- FROM, no edema.   Filed Vitals:   04/25/13 2114 04/26/13 0555 04/26/13 0815 04/26/13 1421  BP: 117/59 128/66  109/59  Pulse: 106 93  89  Temp: 98.7 F (37.1 C) 98 F (36.7 C)  97.8 F (36.6 C)  TempSrc: Oral Oral  Oral  Resp: 20 18  20   Height:      Weight:      SpO2: 95% 96% 92% 94%   Past Medical History  Diagnosis Date  . Lymphadenopathy   . Hyperlipidemia   . COPD (chronic obstructive pulmonary disease)   . Chills   . Low grade fever   . Unintentional weight loss   . Nasal congestion   . Hearing loss   . Weakness   . Substance abuse     beer consumption - 6 per day.  . Cancer     lymphoma  . Waldenstrom macroglobulinemia 07/10/2011   Past Surgical History  Procedure Laterality Date  . Back surgery  2000   Dg Chest 2 View (if Patient Has Fever And/or Copd)  04/23/2013   CLINICAL DATA:  One month history of chest pain and weakness and dyspnea  EXAM: CHEST  2 VIEW  COMPARISON:  PA and lateral chest x-ray at April 13, 2013.  FINDINGS: The lungs are mildly hyperinflated. There is mildly increased density within the pulmonary interstitium bilaterally. The pulmonary vascularity is not clearly engorged. The cardiac silhouette is normal in size. There is no pleural effusion or pneumothorax. The mediastinum is normal in width. Old deformities of the left 6th, 7th, and 8th ribs are demonstrated.  IMPRESSION: Mildly increased interstitial markings in both lungs as compared to the previous study may reflect minimal subsegmental  atelectasis or early interstitial pneumonia. There are findings consistent with underlying COPD. There is no evidence of CHF.   Electronically Signed   By: David  [place]   On: 04/23/2013 12:55   Dg Chest 2 View  04/13/2013   CLINICAL DATA:  Shortness of breath.  EXAM: CHEST  2 VIEW  COMPARISON:  Chest CT 02/05/2012.  Radiographs 07/30/2011.  FINDINGS: The heart size and mediastinal contours are stable. There is stable mild chronic lung disease. No airspace disease, pleural effusion or pneumothorax is identified. Old rib fractures are again noted on the left.  IMPRESSION: Stable chest with stable chronic lung disease. No acute cardiopulmonary process identified.   Electronically Signed   By: Camie Patience M.D.   On: 04/13/2013 19:38  Results for orders placed during the hospital encounter of 04/23/13  CULTURE, BLOOD (ROUTINE X 2)      Result Value Range   Specimen Description BLOOD RIGHT ANTECUBITAL     Special Requests BOTTLES DRAWN AEROBIC AND ANAEROBIC 4CC     Culture  Setup Time       Value: 04/23/2013 20:26     Performed at [facility]   Culture       Value:        BLOOD CULTURE RECEIVED NO GROWTH TO DATE CULTURE WILL BE HELD FOR 5 DAYS BEFORE ISSUING A FINAL  NEGATIVE REPORT     Performed at [facility]   Report Status PENDING    CULTURE, BLOOD (ROUTINE X 2)      Result Value Range   Specimen Description BLOOD LEFT FOREARM     Special Requests BOTTLES DRAWN AEROBIC AND ANAEROBIC 5ML     Culture  Setup Time       Value: 04/23/2013 20:26     Performed at [facility]   Culture       Value:        BLOOD CULTURE RECEIVED NO GROWTH TO DATE CULTURE WILL BE HELD FOR 5 DAYS BEFORE ISSUING A FINAL NEGATIVE REPORT     Performed at [facility]   Report Status PENDING    CBC WITH DIFFERENTIAL      Result Value Range   WBC 29.2 (*) 4.0 - 10.5 K/uL   RBC 4.31  4.22 - 5.81 MIL/uL   Hemoglobin 13.1  13.0 - 17.0 g/dL   HCT 39.1  39.0 - 52.0 %   MCV 90.7  78.0 -  100.0 fL   MCH 30.4  26.0 - 34.0 pg   MCHC 33.5  30.0 - 36.0 g/dL   RDW 13.3  11.5 - 15.5 %   Platelets 199  150 - 400 K/uL   Neutrophils Relative % 94 (*) 43 - 77 %   Lymphocytes Relative 3 (*) 12 - 46 %   Monocytes Relative 3  3 - 12 %   Eosinophils Relative 0  0 - 5 %   Basophils Relative 0  0 - 1 %   Neutro Abs 27.4 (*) 1.7 - 7.7 K/uL   Lymphs Abs 0.9  0.7 - 4.0 K/uL   Monocytes Absolute 0.9  0.1 - 1.0 K/uL   Eosinophils Absolute 0.0  0.0 - 0.7 K/uL   Basophils Absolute 0.0  0.0 - 0.1 K/uL   Smear Review MORPHOLOGY UNREMARKABLE    BASIC METABOLIC PANEL      Result Value Range   Sodium 133 (*) 137 - 147 mEq/L   Potassium 4.4  3.7 - 5.3 mEq/L   Chloride 94 (*) 96 - 112 mEq/L   CO2 26  19 - 32 mEq/L   Glucose, Bld 156 (*) 70 - 99 mg/dL   BUN 12  6 - 23 mg/dL   Creatinine, Ser 0.63  0.50 - 1.35 mg/dL   Calcium 8.9  8.4 - 10.5 mg/dL   GFR calc non Af Amer >90  >90 mL/min   GFR calc Af Amer >90  >90 mL/min  URINALYSIS, ROUTINE W REFLEX MICROSCOPIC      Result Value Range   Color, Urine YELLOW  YELLOW   APPearance CLOUDY (*) CLEAR   Specific Gravity, Urine 1.012  1.005 - 1.030   pH 7.0  5.0 - 8.0   Glucose, UA NEGATIVE  NEGATIVE mg/dL   Hgb urine dipstick NEGATIVE  NEGATIVE   Bilirubin Urine NEGATIVE  NEGATIVE   Ketones, ur NEGATIVE  NEGATIVE mg/dL   Protein, ur NEGATIVE  NEGATIVE mg/dL   Urobilinogen, UA 0.2  0.0 - 1.0 mg/dL   Nitrite NEGATIVE  NEGATIVE   Leukocytes, UA NEGATIVE  NEGATIVE  INFLUENZA PANEL BY PCR (TYPE A & B, H1N1)      Result Value Range   Influenza A By PCR POSITIVE (*) NEGATIVE   Influenza B By PCR NEGATIVE  NEGATIVE   H1N1 flu by pcr NOT DETECTED  NOT DETECTED  HIV ANTIBODY (ROUTINE TESTING)  Result Value Range   HIV NON REACTIVE  NON REACTIVE  LEGIONELLA ANTIGEN, URINE      Result Value Range   Specimen Description URINE, CATHETERIZED     Special Requests NONE     Legionella Antigen, Urine       Value: Negative for Legionella pneumophilia  serogroup 1     Performed at [facility]   Report Status 04/24/2013 FINAL    STREP PNEUMONIAE URINARY ANTIGEN      Result Value Range   Strep Pneumo Urinary Antigen NEGATIVE  NEGATIVE  BASIC METABOLIC PANEL      Result Value Range   Sodium 134 (*) 137 - 147 mEq/L   Potassium 3.8  3.7 - 5.3 mEq/L   Chloride 97  96 - 112 mEq/L   CO2 25  19 - 32 mEq/L   Glucose, Bld 143 (*) 70 - 99 mg/dL   BUN 12  6 - 23 mg/dL   Creatinine, Ser 0.65  0.50 - 1.35 mg/dL   Calcium 8.4  8.4 - 10.5 mg/dL   GFR calc non Af Amer >90  >90 mL/min   GFR calc Af Amer >90  >90 mL/min  CBC      Result Value Range   WBC 15.0 (*) 4.0 - 10.5 K/uL   RBC 3.97 (*) 4.22 - 5.81 MIL/uL   Hemoglobin 11.9 (*) 13.0 - 17.0 g/dL   HCT 36.2 (*) 39.0 - 52.0 %   MCV 91.2  78.0 - 100.0 fL   MCH 30.0  26.0 - 34.0 pg   MCHC 32.9  30.0 - 36.0 g/dL   RDW 13.6  11.5 - 15.5 %   Platelets 184  150 - 400 K/uL  CBC      Result Value Range   WBC 9.2  4.0 - 10.5 K/uL   RBC 3.72 (*) 4.22 - 5.81 MIL/uL   Hemoglobin 11.0 (*) 13.0 - 17.0 g/dL   HCT 33.6 (*) 39.0 - 52.0 %   MCV 90.3  78.0 - 100.0 fL   MCH 29.6  26.0 - 34.0 pg   MCHC 32.7  30.0 - 36.0 g/dL   RDW 13.7  11.5 - 15.5 %   Platelets 171  150 - 400 K/uL  BASIC METABOLIC PANEL      Result Value Range   Sodium 136 (*) 137 - 147 mEq/L   Potassium 3.6 (*) 3.7 - 5.3 mEq/L   Chloride 100  96 - 112 mEq/L   CO2 26  19 - 32 mEq/L   Glucose, Bld 109 (*) 70 - 99 mg/dL   BUN 11  6 - 23 mg/dL   Creatinine, Ser 0.53  0.50 - 1.35 mg/dL   Calcium 8.6  8.4 - 10.5 mg/dL   GFR calc non Af Amer >90  >90 mL/min   GFR calc Af Amer >90  >90 mL/min  CG4 I-STAT (LACTIC ACID)      Result Value Range   Lactic Acid, Venous 0.86  0.5 - 2.2 mmol/L   A/P:  Pt with hx of lymphoplasmacytic lymphoma, 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 He presents now with low grade fevers, night sweats, wt loss and COPD exacerbation with early PNA/flu. Plan is for CT guided  bone marrow biopsy on 1/13 to r/o recurrence of lymphoma.Details/risks of procedure d/w pt with his understanding and consent.

## 2013-04-26 NOTE — Consult Note (Signed)
#  [id number] is consult note.   I do think that he has active of lymphoma. He has a monoclonal studies have been going up steadily.  I think that while he is hospitalized, we should reevaluate him. He needs to have a CT scan done. He also needs to have a bone marrow biopsy done.  It is possible that IVIG might be a need to this pneumonia recurs. His IgG level is low.  Lum Keas  [place] 14:14

## 2013-04-26 NOTE — Evaluation (Signed)
Physical Therapy Evaluation Patient Details Name: [name] [name] MRN: [id number] DOB: 1941-02-11 Today's Date: 04/26/2013 Time: 5643-3295 PT Time Calculation (min): 18 min  PT Assessment / Plan / Recommendation History of Present Illness  73 y.o. male with a past medical history of chronic obstructive pulmonary disease, who presents to the emergency department with complaints of cough/shortness of breath. He states symptoms started approximately 2 weeks ago, having a productive cough with yellow sputum production, with associated shortness of breath, subjective fevers and chills, becoming progressively worse. He also complains of night sweats, nasal congestion, sore throat, generalized weakness, fatigue, malaise and poor tolerance to physical exertion. He was administered a dose of ceftriaxone at the urgent care Center on 04/13/2013. Patient has also been on steroids and doxycycline as well. Despite these interventions his symptoms have progressively worsened. He reports becoming so weak this morning he was unable to get out of bed. A chest x-ray in the emergency department today showed mild increase in interstitial markings in both lungs which could reflect atelectasis and or early pneumonia. Initial lab work showed an elevated white count of 29,200 as he was found to be febrile have a temperature 102.4. In the emergency department he was administered a dose of age azithromycin and ceftriaxone.     Clinical Impression  Pt admitted with above. Pt currently with functional limitations due to the deficits listed below (see PT Problem List).  Pt will benefit from skilled PT to increase their independence and safety with mobility to allow discharge to the venue listed below.  Pt initially not agreeable to mobilize however encouraged to participate and check SpO2.  Pt wishes to d/c home as soon as possible.     PT Assessment  Patient needs continued PT services    Follow Up Recommendations  Home health  PT    Does the patient have the potential to tolerate intense rehabilitation      Barriers to Discharge        Equipment Recommendations  None recommended by PT    Recommendations for Other Services     Frequency Min 3X/week    Precautions / Restrictions Precautions Precautions: Fall Precaution Comments: montior sats   Pertinent Vitals/Pain SpO2 room air at rest 94% SpO2 during ambulation room air 92% SpO2 upon sitting in recliner end of session room air 93% (RN aware)      Mobility  Bed Mobility General bed mobility comments: pt up in recliner on arrival Transfers Overall transfer level: Needs assistance Equipment used: Rolling walker (2 wheeled) Transfers: Sit to/from Stand Sit to Stand: Min guard General transfer comment: verbal cues for safety Ambulation/Gait Ambulation/Gait assistance: Min guard Ambulation Distance (Feet): 40 Feet Assistive device: Rolling walker (2 wheeled) Gait Pattern/deviations: Step-through pattern;Narrow base of support;Shuffle Gait velocity: decr General Gait Details: pt pushed IV pole both hands halfway and then one hand back, has RW and SPC at home to use, SpO2 92% room air, condom cath fell off during ambulation limiting distance, pt reports slight SOB upon return to recliner    Exercises     PT Diagnosis: Difficulty walking  PT Problem List: Decreased strength;Decreased activity tolerance;Decreased mobility PT Treatment Interventions: DME instruction;Gait training;Functional mobility training;Therapeutic activities;Therapeutic exercise;Patient/family education     PT Goals(Current goals can be found in the care plan section) Acute Rehab PT Goals Patient Stated Goal: wishes to return home asap PT Goal Formulation: With patient Time For Goal Achievement: 05/10/13 Potential to Achieve Goals: Good  Visit Information  Last PT Received  On: 04/26/13 Assistance Needed: +1 History of Present Illness: 73 y.o. male with a past medical  history of chronic obstructive pulmonary disease, who presents to the emergency department with complaints of cough/shortness of breath. He states symptoms started approximately 2 weeks ago, having a productive cough with yellow sputum production, with associated shortness of breath, subjective fevers and chills, becoming progressively worse. He also complains of night sweats, nasal congestion, sore throat, generalized weakness, fatigue, malaise and poor tolerance to physical exertion. He was administered a dose of ceftriaxone at the urgent care Center on 04/13/2013. Patient has also been on steroids and doxycycline as well. Despite these interventions his symptoms have progressively worsened. He reports becoming so weak this morning he was unable to get out of bed. A chest x-ray in the emergency department today showed mild increase in interstitial markings in both lungs which could reflect atelectasis and or early pneumonia. Initial lab work showed an elevated white count of 29,200 as he was found to be febrile have a temperature 102.4. In the emergency department he was administered a dose of age azithromycin and ceftriaxone.          Prior [place] expects to be discharged to:: Private residence Living Arrangements: Children (son) Type of Home: House Home Layout: One level Home Equipment: Environmental consultant - 2 wheels;Cane - single point Prior Function Level of Independence: Independent Communication Communication: No difficulties    Cognition  Cognition Arousal/Alertness: Awake/alert Behavior During Therapy: WFL for tasks assessed/performed Overall Cognitive Status: Within Functional Limits for tasks assessed    Extremity/Trunk Assessment Lower Extremity Assessment Lower Extremity Assessment: Generalized weakness   Balance    End of Session PT - End of Session Activity Tolerance: Patient limited by fatigue Patient left: with call bell/phone within reach;in  chair Nurse Communication: Mobility status  GP     Alizabeth Antonio,KATHrine E 04/26/2013, 10:53 AM Carmelia Bake, PT, DPT 04/26/2013 Pager: [phone]

## 2013-04-27 ENCOUNTER — Encounter (HOSPITAL_COMMUNITY): Payer: Self-pay | Admitting: Radiology

## 2013-04-27 ENCOUNTER — Inpatient Hospital Stay (HOSPITAL_COMMUNITY): Payer: Medicare Other

## 2013-04-27 LAB — BASIC METABOLIC PANEL
BUN: 5 mg/dL — AB (ref 6–23)
CHLORIDE: 98 meq/L (ref 96–112)
CO2: 27 meq/L (ref 19–32)
CREATININE: 0.49 mg/dL — AB (ref 0.50–1.35)
Calcium: 8 mg/dL — ABNORMAL LOW (ref 8.4–10.5)
GFR calc Af Amer: >90 mL/min (ref >90)
GFR calc non Af Amer: >90 mL/min (ref >90)
Glucose, Bld: 97 mg/dL (ref 70–99)
Potassium: 3.2 mEq/L — ABNORMAL LOW (ref 3.7–5.3)
Sodium: 137 mEq/L (ref 137–147)

## 2013-04-27 LAB — CBC
HEMATOCRIT: 32.4 % — AB (ref 39.0–52.0)
Hemoglobin: 10.6 g/dL — ABNORMAL LOW (ref 13.0–17.0)
MCH: 29.4 pg (ref 26.0–34.0)
MCHC: 32.7 g/dL (ref 30.0–36.0)
MCV: 90 fL (ref 78.0–100.0)
Platelets: 188 10*3/uL (ref 150–400)
RBC: 3.6 MIL/uL — ABNORMAL LOW (ref 4.22–5.81)
RDW: 13.6 % (ref 11.5–15.5)
WBC: 5.8 10*3/uL (ref 4.0–10.5)

## 2013-04-27 LAB — APTT: aPTT: 30 seconds (ref 24–37)

## 2013-04-27 LAB — PROTIME-INR
INR: 1.05 (ref 0.00–1.49)
Prothrombin Time: 13.5 seconds (ref 11.6–15.2)

## 2013-04-27 LAB — BONE MARROW EXAM

## 2013-04-27 MED ORDER — MIDAZOLAM HCL 2 MG/2ML IJ SOLN
INTRAMUSCULAR | Status: AC | PRN
  Administered 2013-04-27 (×2): 0.5 mg via INTRAVENOUS
  Administered 2013-04-27: 1 mg via INTRAVENOUS

## 2013-04-27 MED ORDER — IOHEXOL 300 MG/ML  SOLN
25.0000 mL | INTRAMUSCULAR | Status: AC
  Administered 2013-04-27 (×2): 25 mL via ORAL

## 2013-04-27 MED ORDER — FENTANYL CITRATE 0.05 MG/ML IJ SOLN
INTRAMUSCULAR | Status: AC | PRN
  Administered 2013-04-27: 25 ug via INTRAVENOUS

## 2013-04-27 MED ORDER — CEFPODOXIME PROXETIL 200 MG PO TABS: 200.0000 mg | ORAL_TABLET | Freq: Two times a day (BID) | ORAL | Status: DC

## 2013-04-27 MED ORDER — MIDAZOLAM HCL 2 MG/2ML IJ SOLN
INTRAMUSCULAR | Status: AC
  Filled 2013-04-27: qty 6

## 2013-04-27 MED ORDER — CEFPODOXIME PROXETIL 200 MG PO TABS
200.0000 mg | ORAL_TABLET | Freq: Two times a day (BID) | ORAL | Status: DC
  Administered 2013-04-27: 200 mg via ORAL
  Filled 2013-04-27 (×2): qty 1

## 2013-04-27 MED ORDER — FENTANYL CITRATE 0.05 MG/ML IJ SOLN
INTRAMUSCULAR | Status: AC
  Filled 2013-04-27: qty 6

## 2013-04-27 MED ORDER — OSELTAMIVIR PHOSPHATE 75 MG PO CAPS: 75.0000 mg | ORAL_CAPSULE | Freq: Two times a day (BID) | ORAL | Status: DC

## 2013-04-27 MED ORDER — BENZONATATE 100 MG PO CAPS: 100.0000 mg | ORAL_CAPSULE | Freq: Three times a day (TID) | ORAL | Status: DC | PRN

## 2013-04-27 MED ORDER — AZITHROMYCIN 500 MG PO TABS: 500.0000 mg | ORAL_TABLET | ORAL | Status: DC

## 2013-04-27 MED ORDER — IOHEXOL 300 MG/ML  SOLN
100.0000 mL | Freq: Once | INTRAMUSCULAR | Status: AC | PRN
  Administered 2013-04-27: 100 mL via INTRAVENOUS

## 2013-04-27 NOTE — Consult Note (Signed)
NAMETYVON, [name] [name]                  ACCOUNT NO.:  [id number]  MEDICAL RECORD NO.:  [id number]  LOCATION:  1402                         FACILITY:  [facility] [place]:  [name] [name], M.D.  DATE OF BIRTH:  [birth year]  DATE OF CONSULTATION:  04/26/2013 DATE OF DISCHARGE:                                CONSULTATION   REFERRING PHYSICIAN:  Dr. Shanon Brow Tat.  REASON FOR CONSULTATION: 1. Pneumonia. 2. History of lymphoplasmacytic lymphoma.  HISTORY OF PRESENT ILLNESS:  Mr. Hetland is a very nice 73 year old white gentleman.  He is well known to me.  He has history of lymphoplasmacytic lymphoma.  We went ahead and treated him with R-CVP.  He tolerated this fairly well.  After completion of therapy, which was on August of 2013, his followup studies showed that he still had residual disease.  This was found in a bone marrow biopsy.  His last CT scan that had done were back on October 2013.  It showed stable mediastinal adenopathy.  He had stable retroperitoneal lymph nodes.  He does have a history of alcohol use.  He tried to again stop drinking. He is not smoking.  He was subsequently admitted on the 9th of January.  He had influenza A.  When he was admitted, his white cell count was 29, hemoglobin 13, hematocrit 39, platelet count 199.  His sodium is 133, BUN 12, creatinine 0.63.  He did have a chest x-ray on admission.  This showed some underlying COPD.  There may be some early interstitial pneumonia.  He has been started on IV antibiotics.  He is on Tamiflu.  I am seeing him so that we can help monitor his lymphoma.  When we last saw him in the office in December, his monoclonal spike was going up.  It was 1.56 g/dL.  His IgM level was 2480 mg/dL and his kappa light chain was at 31 mg/dL.  I do believe that his disease is active and this may be causing him to have issues with respect to this pneumonia.  PAST MEDICAL HISTORY:  Remarkable for, 1. COPD. 2. Hyperlipidemia. 3.  Alcohol abuse.  ALLERGIES:  None.  MEDICATIONS AT HOME:  Combivent inhaler 1 puff q.6h hours.  Ultram 50 mg q.6h hours p.r.n.  SOCIAL HISTORY:  Remarkable for tobacco use, stopped 10 years ago.  He does have significant alcohol use.  He says he is not drinking right now.  However, his ex-wife, I am sure, will tell you otherwise.  PHYSICAL EXAMINATION:  General:  This is a fairly well developed, well- nourished white gentleman in no obvious distress.  Vital Signs:  Show a temperature of 98, pulse 93, respiratory rate 18, blood pressure 120/66, weight is 158.  Head and Neck:  Shows normocephalic, atraumatic skull. There are no ocular or oral lesions.  There is no scleral icterus. There is no obvious adenopathy in the neck.  Lungs:  Clear bilaterally. He does have some wheezes, more so on the left side than right side. Cardiac:  Regular rate and rhythm with a normal S1, S2.  There are no murmurs, rubs, or bruits.  Abdomen:  Soft.  He has good bowel  sounds. There is no fluid wave.  There is no palpable abdominal mass.  There is no palpable hepatosplenomegaly.  Back:  No tenderness over the spine, ribs, or hips.  Extremities:  Show no clubbing, cyanosis, or edema. Neurological:  No focal neurological deficits.  IMPRESSION:  Mr. Palau is a 73 year old gentleman with history of lymphoplasmacytic lymphoma.  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  I do suspect that his disease is active and that this is a part of what is trigerring this flu/pneumonia.  While he is in the hospital, I think it will be very helpful to re- evaluate him.  He needs a CT scan done, needs to have a bone marrow biopsy done.  We can get all these arranged.  We will go ahead and follow him along.  If there is anything we can do to help out, we certainly shall.     [name] [name], M.D.     PRE/MEDQ  D:  04/26/2013  T:  04/26/2013  Job:  [id number]

## 2013-04-27 NOTE — Procedures (Signed)
Technically successful CT guided bone marrow aspiration and biopsy of left iliac crest. No immediate complications. Awaiting pathology report.    

## 2013-04-29 LAB — CULTURE, BLOOD (ROUTINE X 2)
Culture: NO GROWTH
Culture: NO GROWTH

## 2013-05-04 ENCOUNTER — Telehealth: Payer: Self-pay | Admitting: Hematology & Oncology

## 2013-05-04 LAB — CHROMOSOME ANALYSIS, BONE MARROW

## 2013-05-04 NOTE — Telephone Encounter (Signed)
Pt aware of 1-21 appointment

## 2013-05-05 ENCOUNTER — Other Ambulatory Visit: Payer: Medicare Other | Admitting: Lab

## 2013-05-05 ENCOUNTER — Ambulatory Visit (HOSPITAL_BASED_OUTPATIENT_CLINIC_OR_DEPARTMENT_OTHER): Payer: Medicare Other

## 2013-05-05 ENCOUNTER — Ambulatory Visit (HOSPITAL_BASED_OUTPATIENT_CLINIC_OR_DEPARTMENT_OTHER): Payer: Medicare Other | Admitting: Hematology & Oncology

## 2013-05-05 ENCOUNTER — Telehealth: Payer: Self-pay | Admitting: Hematology & Oncology

## 2013-05-05 ENCOUNTER — Encounter: Payer: Self-pay | Admitting: Hematology & Oncology

## 2013-05-05 VITALS — BP 107/63 | HR 72 | Temp 97.6°F | Resp 18 | Ht 69.0 in | Wt 153.0 lb

## 2013-05-05 DIAGNOSIS — Z87898 Personal history of other specified conditions: Secondary | ICD-10-CM

## 2013-05-05 DIAGNOSIS — F1010 Alcohol abuse, uncomplicated: Secondary | ICD-10-CM

## 2013-05-05 DIAGNOSIS — D510 Vitamin B12 deficiency anemia due to intrinsic factor deficiency: Secondary | ICD-10-CM

## 2013-05-05 HISTORY — DX: Vitamin B12 deficiency anemia due to intrinsic factor deficiency: D51.0

## 2013-05-05 MED ORDER — CYANOCOBALAMIN 1000 MCG/ML IJ SOLN
INTRAMUSCULAR | Status: AC
  Filled 2013-05-05: qty 1

## 2013-05-05 MED ORDER — VITAMIN B-6 250 MG PO TABS: 250.0000 mg | ORAL_TABLET | Freq: Every day | ORAL | Status: AC

## 2013-05-05 MED ORDER — CYANOCOBALAMIN 1000 MCG/ML IJ SOLN
1000.0000 ug | Freq: Once | INTRAMUSCULAR | Status: AC
  Administered 2013-05-05: 1000 ug via INTRAMUSCULAR

## 2013-05-05 NOTE — Telephone Encounter (Signed)
Son will call to schedule appointments

## 2013-05-05 NOTE — Progress Notes (Signed)
This office note has been dictated.

## 2013-05-05 NOTE — Patient Instructions (Signed)
Cyanocobalamin, Vitamin B12 injection °What is this medicine? °CYANOCOBALAMIN (sye an oh koe BAL a min) is a man made form of vitamin B12. Vitamin B12 is used in the growth of healthy blood cells, nerve cells, and proteins in the body. It also helps with the metabolism of fats and carbohydrates. This medicine is used to treat people who can not absorb vitamin B12. °This medicine may be used for other purposes; ask your health care provider or pharmacist if you have questions. °COMMON BRAND NAME(S): Cyomin, LA-12 , Nutri-Twelve , Primabalt °What should I tell my health care provider before I take this medicine? °They need to know if you have any of these conditions: °-kidney disease °-Leber's disease °-megaloblastic anemia °-an unusual or allergic reaction to cyanocobalamin, cobalt, other medicines, foods, dyes, or preservatives °-pregnant or trying to get pregnant °-breast-feeding °How should I use this medicine? °This medicine is injected into a muscle or deeply under the skin. It is usually given by a health care professional in a clinic or doctor's office. However, your doctor may teach you how to inject yourself. Follow all instructions. °Talk to your pediatrician regarding the use of this medicine in children. Special care may be needed. °Overdosage: If you think you have taken too much of this medicine contact a poison control center or emergency room at once. °NOTE: This medicine is only for you. Do not share this medicine with others. °What if I miss a dose? °If you are given your dose at a clinic or doctor's office, call to reschedule your appointment. If you give your own injections and you miss a dose, take it as soon as you can. If it is almost time for your next dose, take only that dose. Do not take double or extra doses. °What may interact with this medicine? °-colchicine °-heavy alcohol intake °This list may not describe all possible interactions. Give your health care provider a list of all the  medicines, herbs, non-prescription drugs, or dietary supplements you use. Also tell them if you smoke, drink alcohol, or use illegal drugs. Some items may interact with your medicine. °What should I watch for while using this medicine? °Visit your doctor or health care professional regularly. You may need blood work done while you are taking this medicine. °You may need to follow a special diet. Talk to your doctor. Limit your alcohol intake and avoid smoking to get the best benefit. °What side effects may I notice from receiving this medicine? °Side effects that you should report to your doctor or health care professional as soon as possible: °-allergic reactions like skin rash, itching or hives, swelling of the face, lips, or tongue °-blue tint to skin °-chest tightness, pain °-difficulty breathing, wheezing °-dizziness °-red, swollen painful area on the leg °Side effects that usually do not require medical attention (report to your doctor or health care professional if they continue or are bothersome): °-diarrhea °-headache °This list may not describe all possible side effects. Call your doctor for medical advice about side effects. You may report side effects to FDA at 1-800-FDA-1088. °Where should I keep my medicine? °Keep out of the reach of children. °Store at room temperature between 15 and 30 degrees C (59 and 85 degrees F). Protect from light. Throw away any unused medicine after the expiration date. °NOTE: This sheet is a summary. It may not cover all possible information. If you have questions about this medicine, talk to your doctor, pharmacist, or health care provider. °© 2014, [employer]. (2007-07-13   22:10:20) ° °

## 2013-05-06 ENCOUNTER — Encounter: Payer: Self-pay | Admitting: Hematology & Oncology

## 2013-05-06 ENCOUNTER — Telehealth: Payer: Self-pay | Admitting: Hematology & Oncology

## 2013-05-06 NOTE — Telephone Encounter (Signed)
Pt aware of 1-30 and 3-13 appointments

## 2013-05-06 NOTE — Progress Notes (Signed)
DIAGNOSES: 1. Lymphoplasmacytic lymphoma. 2. 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 3. Recurrent pneumonia.  CURRENT THERAPY:  Observation.  INTERIM HISTORY:  Mr. Coiner comes in for a followup.  He was hospitalized a week or so ago.  He had pneumonia.  This, I believe, is clearly reflective of his lymphoplasmacytic lymphoma.  While in the hospital, we went ahead and did a workup on him.  He has CT scans done.  Thankfully, the CT scans did not show any evidence of progressive lymphadenopathy.  He had no splenomegaly.  His bone marrow was done.  This was done on January 13th.  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  This appears to be similar to his last bone marrow that we did on him back in 2013.  He clearly has an altered immune system.  I think his immunoglobulins just is not that functional.  His last monoclonal spike back in December was 1.56 g/dL.  IgM level was 2500 mg/dL, and kappa light chain was 31 mg/dL.  He, unfortunately, is still drinking quite a bit.  His son was with him. Sometimes, he drinks 12 beers a day.  This definitely is a huge problem. I have talked to Mr. Pedrosa on many occasions in the past about the ills and dangers of alcohol.  We are now seeing this.  He is more demented. He is having more problems with neuropathy.  I believe that he will benefit from IVIG.  There also maybe some degree of pernicious anemia with him.  We are following this along.  I will get him on a vitamin B12.  This may help with this neuropathy.  Vitamin B6 also, I think, will help.  He is having problems with oxygen at home.  I checked his oxygen saturations.  With walking, his oxygen saturation was 87%.  PHYSICAL EXAMINATION:  General:  This is a thin white gentleman.  He does have some slight memory difficulties.  Vital Signs:  Temperature 97.6, pulse 72, respiratory rate 18, blood pressure  107/63, weight is 153 pounds.  Head and Neck:  Normocephalic, atraumatic skull.  There are no ocular or oral lesions.  He has no scleral icterus.  There is no adenopathy in his neck.  Lungs:  With some slight decrease at the bases. There is some slight decrease over his right lung.  Cardiac:  Regular rate and rhythm with a normal S1, S2.  There are no murmurs, rubs, or bruits.  Abdomen:  Soft.  There is no fluid wave.  There is no palpable abdominal mass.  There is no palpable hepatosplenomegaly.  Back:  Shows no tenderness over the spine, ribs, or hips.  Extremities:  Show some slight muscle atrophy in upper and lower extremities.  He has decreased range motion of his joints.  He has 4/5 strength in his legs. Neurological:  Shows a little bit of a shuffling gait.  He has some decreased sensation in his feet bilaterally, there is some slight decrease in his hands.  Skin:  Shows no rashes.  LABORATORY STUDIES:  White cell count is 5.8, hemoglobin 10.6, hematocrit 32.4, platelet count is 188.  BUN is 5, creatinine is 0.49.  IMPRESSION:  Mr. Boyajian is a 73 year old gentleman.  He has lymphoplasmacytic lymphoma.  He did get chemotherapy with R-CVP.  This was completed back in August 2013.  He had residual disease, but was asymptomatic.  Again, I think his issues now are from the immune system.  We will get him on IVIG.  I think this will help decrease his risk of pneumonia and other infections.  I also believe that he would benefit from vitamin B12.  I think that he does have pernicious anemia.  I think oxygen at home is going to be necessary.  He did have a history of tobacco use in the past.  This was a pretty complicated visit.  We had to take care a lot of things with Mr. Knittle.  I want to see him back in another 6 weeks.  We will start the IVIG in a week or so.  I reviewed all of his labs and x-rays with his son.  I went over my recommendations.  We spent a good 40 minutes  with him today.    ______________________________ Volanda Napoleon, M.D. PRE/MEDQ  D:  05/05/2013  T:  05/06/2013  Job:  [id number]

## 2013-05-11 ENCOUNTER — Telehealth: Payer: Self-pay

## 2013-05-11 NOTE — Telephone Encounter (Signed)
[name] calling to check on the order that was sent for this patient [phone] ext [phone]

## 2013-05-13 ENCOUNTER — Encounter: Payer: Self-pay | Admitting: Hematology & Oncology

## 2013-05-13 NOTE — Telephone Encounter (Signed)
[name] [name] has paperwork that needs to be signed for the order they received for a bedside commode. We do not have the paperwork here. States Dr Joseph Art sent the order over. No record of this, Dr. Brigitte Pulse last to see pt and arrainged home O2.  They are checking to see if his oncologist has ordered it. If they need any further assistance they will give [place] a call back.

## 2013-05-14 ENCOUNTER — Ambulatory Visit (HOSPITAL_BASED_OUTPATIENT_CLINIC_OR_DEPARTMENT_OTHER): Payer: Medicare Other

## 2013-05-14 VITALS — BP 96/59 | HR 82 | Temp 96.7°F | Resp 16

## 2013-05-14 DIAGNOSIS — J189 Pneumonia, unspecified organism: Secondary | ICD-10-CM

## 2013-05-14 DIAGNOSIS — D6481 Anemia due to antineoplastic chemotherapy: Secondary | ICD-10-CM

## 2013-05-14 DIAGNOSIS — D510 Vitamin B12 deficiency anemia due to intrinsic factor deficiency: Secondary | ICD-10-CM

## 2013-05-14 DIAGNOSIS — C8589 Other specified types of non-Hodgkin lymphoma, extranodal and solid organ sites: Secondary | ICD-10-CM

## 2013-05-14 DIAGNOSIS — T451X5A Adverse effect of antineoplastic and immunosuppressive drugs, initial encounter: Principal | ICD-10-CM

## 2013-05-14 DIAGNOSIS — D899 Disorder involving the immune mechanism, unspecified: Secondary | ICD-10-CM

## 2013-05-14 MED ORDER — DIPHENHYDRAMINE HCL 25 MG PO CAPS
ORAL_CAPSULE | ORAL | Status: AC
  Filled 2013-05-14: qty 1

## 2013-05-14 MED ORDER — ACETAMINOPHEN 325 MG PO TABS
ORAL_TABLET | ORAL | Status: AC
  Filled 2013-05-14: qty 2

## 2013-05-14 MED ORDER — ACETAMINOPHEN 325 MG PO TABS
650.0000 mg | ORAL_TABLET | Freq: Once | ORAL | Status: AC
  Administered 2013-05-14: 650 mg via ORAL

## 2013-05-14 MED ORDER — CYANOCOBALAMIN 1000 MCG/ML IJ SOLN
1000.0000 ug | Freq: Once | INTRAMUSCULAR | Status: AC
  Administered 2013-05-14: 1000 ug via INTRAMUSCULAR

## 2013-05-14 MED ORDER — DIPHENHYDRAMINE HCL 25 MG PO CAPS
25.0000 mg | ORAL_CAPSULE | Freq: Once | ORAL | Status: AC
  Administered 2013-05-14: 25 mg via ORAL

## 2013-05-14 MED ORDER — CYANOCOBALAMIN 1000 MCG/ML IJ SOLN
INTRAMUSCULAR | Status: AC
  Filled 2013-05-14: qty 1

## 2013-05-14 MED ORDER — IMMUNE GLOBULIN (HUMAN) 20 GM/200ML IV SOLN
40.0000 g | Freq: Once | INTRAVENOUS | Status: AC
  Administered 2013-05-14: 40 g via INTRAVENOUS
  Filled 2013-05-14: qty 400

## 2013-05-14 NOTE — Progress Notes (Signed)
At 1015, noted blood pressure drop to 90/ 65.  Pt denies feeling dizzy.  Skin warm and dry.  Dr. Marin Olp made aware.  IV fluid started at 135ml./hr.

## 2013-05-14 NOTE — Patient Instructions (Signed)

## 2013-05-17 ENCOUNTER — Other Ambulatory Visit: Payer: Self-pay | Admitting: *Deleted

## 2013-05-27 ENCOUNTER — Encounter: Payer: Self-pay | Admitting: Hematology & Oncology

## 2013-06-25 ENCOUNTER — Ambulatory Visit (HOSPITAL_BASED_OUTPATIENT_CLINIC_OR_DEPARTMENT_OTHER): Payer: Medicare Other | Admitting: Hematology & Oncology

## 2013-06-25 ENCOUNTER — Ambulatory Visit (HOSPITAL_BASED_OUTPATIENT_CLINIC_OR_DEPARTMENT_OTHER): Payer: Medicare Other | Admitting: Lab

## 2013-06-25 ENCOUNTER — Encounter: Payer: Self-pay | Admitting: Hematology & Oncology

## 2013-06-25 ENCOUNTER — Ambulatory Visit (HOSPITAL_BASED_OUTPATIENT_CLINIC_OR_DEPARTMENT_OTHER): Payer: Medicare Other

## 2013-06-25 VITALS — BP 89/55 | HR 75 | Temp 98.0°F | Resp 20

## 2013-06-25 VITALS — BP 100/55 | HR 100 | Temp 98.2°F | Resp 18 | Ht 71.0 in | Wt 152.0 lb

## 2013-06-25 DIAGNOSIS — G608 Other hereditary and idiopathic neuropathies: Secondary | ICD-10-CM

## 2013-06-25 DIAGNOSIS — M898X9 Other specified disorders of bone, unspecified site: Secondary | ICD-10-CM

## 2013-06-25 DIAGNOSIS — G893 Neoplasm related pain (acute) (chronic): Secondary | ICD-10-CM

## 2013-06-25 DIAGNOSIS — C8589 Other specified types of non-Hodgkin lymphoma, extranodal and solid organ sites: Secondary | ICD-10-CM

## 2013-06-25 DIAGNOSIS — E538 Deficiency of other specified B group vitamins: Secondary | ICD-10-CM

## 2013-06-25 DIAGNOSIS — D6481 Anemia due to antineoplastic chemotherapy: Secondary | ICD-10-CM

## 2013-06-25 DIAGNOSIS — D839 Common variable immunodeficiency, unspecified: Secondary | ICD-10-CM

## 2013-06-25 DIAGNOSIS — C880 Waldenstrom macroglobulinemia: Secondary | ICD-10-CM

## 2013-06-25 DIAGNOSIS — D510 Vitamin B12 deficiency anemia due to intrinsic factor deficiency: Secondary | ICD-10-CM

## 2013-06-25 DIAGNOSIS — T451X5A Adverse effect of antineoplastic and immunosuppressive drugs, initial encounter: Principal | ICD-10-CM

## 2013-06-25 DIAGNOSIS — D509 Iron deficiency anemia, unspecified: Secondary | ICD-10-CM

## 2013-06-25 DIAGNOSIS — J189 Pneumonia, unspecified organism: Secondary | ICD-10-CM

## 2013-06-25 LAB — CBC WITH DIFFERENTIAL (CANCER CENTER ONLY)
BASO#: 0.1 10*3/uL (ref 0.0–0.2)
BASO%: 1.2 % (ref 0.0–2.0)
EOS ABS: 0.1 10*3/uL (ref 0.0–0.5)
EOS%: 1.5 % (ref 0.0–7.0)
HCT: 34.9 % — ABNORMAL LOW (ref 38.7–49.9)
HGB: 11.2 g/dL — ABNORMAL LOW (ref 13.0–17.1)
LYMPH#: 1.5 10*3/uL (ref 0.9–3.3)
LYMPH%: 20.3 % (ref 14.0–48.0)
MCH: 28.4 pg (ref 28.0–33.4)
MCHC: 32.1 g/dL (ref 32.0–35.9)
MCV: 89 fL (ref 82–98)
MONO#: 1.4 10*3/uL — ABNORMAL HIGH (ref 0.1–0.9)
MONO%: 19.2 % — ABNORMAL HIGH (ref 0.0–13.0)
NEUT%: 57.8 % (ref 40.0–80.0)
NEUTROS ABS: 4.3 10*3/uL (ref 1.5–6.5)
PLATELETS: 292 10*3/uL (ref 145–400)
RBC: 3.94 10*6/uL — ABNORMAL LOW (ref 4.20–5.70)
RDW: 15 % (ref 11.1–15.7)
WBC: 7.4 10*3/uL (ref 4.0–10.0)

## 2013-06-25 MED ORDER — ACETAMINOPHEN 160 MG/5ML PO SOLN
650.0000 mg | Freq: Once | ORAL | Status: DC
  Filled 2013-06-25: qty 20.3

## 2013-06-25 MED ORDER — CYANOCOBALAMIN 1000 MCG/ML IJ SOLN
INTRAMUSCULAR | Status: AC
  Filled 2013-06-25: qty 1

## 2013-06-25 MED ORDER — DIPHENHYDRAMINE HCL 25 MG PO CAPS
25.0000 mg | ORAL_CAPSULE | Freq: Once | ORAL | Status: AC
  Administered 2013-06-25: 25 mg via ORAL

## 2013-06-25 MED ORDER — IMMUNE GLOBULIN (HUMAN) 20 GM/200ML IV SOLN
40.0000 g | Freq: Once | INTRAVENOUS | Status: AC
  Administered 2013-06-25: 40 g via INTRAVENOUS
  Filled 2013-06-25: qty 400

## 2013-06-25 MED ORDER — DIPHENHYDRAMINE HCL 25 MG PO CAPS
ORAL_CAPSULE | ORAL | Status: AC
  Filled 2013-06-25: qty 1

## 2013-06-25 MED ORDER — SODIUM CHLORIDE 0.9 % IV SOLN
Freq: Once | INTRAVENOUS | Status: AC
  Administered 2013-06-25: 09:00:00 via INTRAVENOUS

## 2013-06-25 MED ORDER — IMMUNE GLOBULIN (HUMAN) 10 GM/200ML IV SOLN: 40.0000 g | Freq: Once | INTRAVENOUS | Status: DC

## 2013-06-25 MED ORDER — ACETAMINOPHEN 325 MG PO TABS
650.0000 mg | ORAL_TABLET | Freq: Once | ORAL | Status: AC
  Administered 2013-06-25: 650 mg via ORAL

## 2013-06-25 MED ORDER — ACETAMINOPHEN 325 MG PO TABS
ORAL_TABLET | ORAL | Status: AC
  Filled 2013-06-25: qty 2

## 2013-06-25 MED ORDER — CYANOCOBALAMIN 1000 MCG/ML IJ SOLN
1000.0000 ug | Freq: Once | INTRAMUSCULAR | Status: AC
  Administered 2013-06-25: 1000 ug via INTRAMUSCULAR

## 2013-06-25 NOTE — Patient Instructions (Signed)

## 2013-06-25 NOTE — Progress Notes (Signed)
  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 He continues to have issues with falling. Again he has this neuropathy. This is from alcohol use. He is really cut back on drinking 40 tells me.    Is living with one of his sons. They are watching him very closely.    He's had no bleeding. He did have a fall and scraped his hands.    He's had no fever. He's had no nausea vomiting. I think he's been eating okay.    The we last checked his immunoglobulin levels, his IgG level was... 264. His IgM level was elevated at 2500 mg/dL. Kappa Light chain was 31 mg deciliter.   PHYSICAL EXAMINATION:  Thin white gentleman in no obvious distress. Vital signs show temperature of 98.2. Pulse 100. Blood pressure 100/55. Weight is 152 pounds. Head and neck exam shows no ocular or oral lesions. There are no palpable cervical or supraclavicular lymph nodes. Lungs are clear. Cardiac exam slightly tachycardic but regular. He has an occasional extra beat. Abdomen is soft. Active bowel sounds. There is no fluid wave. There is no palpable hepato- megaly. Back exam no tenderness over the spine ribs or hips. There may be some slight kyphosis. Extremities shows some osteoarthritic changes and he has some areas of excoriation on his fingers where he fell. He is decent strength. Neurological exam shows some dementia. He has some slight ataxia.   LABORATORY STUDIES:  White cell count 7.4. Hemoglobin 11.2. Hematocrit 34.9. Platelet count 292.    His blood smear shows some slight anisocytosis and poikilocytosis. He has no nucleated red cells. There is no rouleau formation. White cells are normal in morphology. No hypersegmented polys are  noted. Platelets are adequate.   IMPRESSION:  [name] [name] is a 73 year old gentleman. He has recurrent lympho-plasmacytic lymphoma. He did have a bone marrow biopsy done back in January which did show the lymphoma. Because of his other health issues, I did not elect to do treat him right now.    We will have to continue to follow his studies and see how his IgM level increases. We may have to consider an intervention to help prevent hyperviscosity if his IgM level continues to rise.    I will like to have him back in 6 weeks.    I spent a half hour with him trying to explain what was wrong with him and tried to have him understand why we are treating him.   Volanda Napoleon, MD 06/25/2013

## 2013-06-28 ENCOUNTER — Encounter: Payer: Self-pay | Admitting: Family Medicine

## 2013-06-29 LAB — TRANSFERRIN RECEPTOR, SOLUABLE: TRANSFERRIN RECEPTOR, SOLUBLE: 1.1 mg/L (ref 0.76–1.76)

## 2013-06-29 LAB — COMPREHENSIVE METABOLIC PANEL
ALBUMIN: 2.9 g/dL — AB (ref 3.5–5.2)
ALT: <8 U/L (ref 0–53)
AST: 8 U/L (ref 0–37)
Alkaline Phosphatase: 59 U/L (ref 39–117)
BILIRUBIN TOTAL: 0.3 mg/dL (ref 0.2–1.2)
BUN: 9 mg/dL (ref 6–23)
CO2: 25 mEq/L (ref 19–32)
Calcium: 8.6 mg/dL (ref 8.4–10.5)
Chloride: 103 mEq/L (ref 96–112)
Creatinine, Ser: 0.59 mg/dL (ref 0.50–1.35)
GLUCOSE: 118 mg/dL — AB (ref 70–99)
POTASSIUM: 4.1 meq/L (ref 3.5–5.3)
SODIUM: 138 meq/L (ref 135–145)
Total Protein: 6.5 g/dL (ref 6.0–8.3)

## 2013-06-29 LAB — PROTEIN ELECTROPHORESIS, SERUM, WITH REFLEX
Albumin ELP: 40.9 % — ABNORMAL LOW (ref 55.8–66.1)
Alpha-1-Globulin: 7.9 % — ABNORMAL HIGH (ref 2.9–4.9)
Alpha-2-Globulin: 13 % — ABNORMAL HIGH (ref 7.1–11.8)
Beta 2: 23.7 % — ABNORMAL HIGH (ref 3.2–6.5)
Beta Globulin: 6.3 % (ref 4.7–7.2)
GAMMA GLOBULIN: 8.2 % — AB (ref 11.1–18.8)
M-SPIKE, %: 1.43 g/dL
TOTAL PROTEIN, SERUM ELECTROPHOR: 6.5 g/dL (ref 6.0–8.3)

## 2013-06-29 LAB — IGG, IGA, IGM
IGA: 10 mg/dL — AB (ref 68–379)
IGM, SERUM: 2080 mg/dL — AB (ref 41–251)
IgG (Immunoglobin G), Serum: 624 mg/dL — ABNORMAL LOW (ref 650–1600)

## 2013-06-29 LAB — KAPPA/LAMBDA LIGHT CHAINS
Kappa free light chain: 33.7 mg/dL — ABNORMAL HIGH (ref 0.33–1.94)
Kappa:Lambda Ratio: 58.1 — ABNORMAL HIGH (ref 0.26–1.65)
LAMBDA FREE LGHT CHN: 0.58 mg/dL (ref 0.57–2.63)

## 2013-06-29 LAB — IFE INTERPRETATION

## 2013-07-01 ENCOUNTER — Other Ambulatory Visit: Payer: Medicare Other | Admitting: Lab

## 2013-07-01 ENCOUNTER — Ambulatory Visit: Payer: Medicare Other | Admitting: Hematology & Oncology

## 2013-08-06 ENCOUNTER — Ambulatory Visit (HOSPITAL_BASED_OUTPATIENT_CLINIC_OR_DEPARTMENT_OTHER): Payer: Medicare Other | Admitting: Lab

## 2013-08-06 ENCOUNTER — Ambulatory Visit (HOSPITAL_BASED_OUTPATIENT_CLINIC_OR_DEPARTMENT_OTHER): Payer: Medicare Other | Admitting: Hematology & Oncology

## 2013-08-06 ENCOUNTER — Encounter: Payer: Self-pay | Admitting: Hematology & Oncology

## 2013-08-06 ENCOUNTER — Ambulatory Visit (HOSPITAL_BASED_OUTPATIENT_CLINIC_OR_DEPARTMENT_OTHER): Payer: Medicare Other

## 2013-08-06 VITALS — BP 86/52 | HR 109 | Temp 97.9°F | Resp 18 | Ht 70.0 in | Wt 147.0 lb

## 2013-08-06 VITALS — BP 86/52 | HR 79 | Temp 96.6°F | Resp 20

## 2013-08-06 DIAGNOSIS — D510 Vitamin B12 deficiency anemia due to intrinsic factor deficiency: Secondary | ICD-10-CM | POA: Diagnosis not present

## 2013-08-06 DIAGNOSIS — G893 Neoplasm related pain (acute) (chronic): Secondary | ICD-10-CM

## 2013-08-06 DIAGNOSIS — C8589 Other specified types of non-Hodgkin lymphoma, extranodal and solid organ sites: Secondary | ICD-10-CM

## 2013-08-06 DIAGNOSIS — C8800 Waldenstrom macroglobulinemia not having achieved remission: Secondary | ICD-10-CM

## 2013-08-06 DIAGNOSIS — J189 Pneumonia, unspecified organism: Secondary | ICD-10-CM

## 2013-08-06 DIAGNOSIS — D6481 Anemia due to antineoplastic chemotherapy: Secondary | ICD-10-CM

## 2013-08-06 DIAGNOSIS — J8410 Pulmonary fibrosis, unspecified: Secondary | ICD-10-CM

## 2013-08-06 DIAGNOSIS — T451X5A Adverse effect of antineoplastic and immunosuppressive drugs, initial encounter: Principal | ICD-10-CM

## 2013-08-06 DIAGNOSIS — D839 Common variable immunodeficiency, unspecified: Secondary | ICD-10-CM

## 2013-08-06 DIAGNOSIS — D801 Nonfamilial hypogammaglobulinemia: Secondary | ICD-10-CM

## 2013-08-06 DIAGNOSIS — M898X9 Other specified disorders of bone, unspecified site: Principal | ICD-10-CM

## 2013-08-06 LAB — CBC WITH DIFFERENTIAL (CANCER CENTER ONLY)
BASO#: 0.1 10*3/uL (ref 0.0–0.2)
BASO%: 1.4 % (ref 0.0–2.0)
EOS%: 2.1 % (ref 0.0–7.0)
Eosinophils Absolute: 0.1 10*3/uL (ref 0.0–0.5)
HEMATOCRIT: 36.2 % — AB (ref 38.7–49.9)
HGB: 11.7 g/dL — ABNORMAL LOW (ref 13.0–17.1)
LYMPH#: 1.2 10*3/uL (ref 0.9–3.3)
LYMPH%: 21 % (ref 14.0–48.0)
MCH: 27.6 pg — ABNORMAL LOW (ref 28.0–33.4)
MCHC: 32.3 g/dL (ref 32.0–35.9)
MCV: 85 fL (ref 82–98)
MONO#: 1 10*3/uL — ABNORMAL HIGH (ref 0.1–0.9)
MONO%: 17.3 % — ABNORMAL HIGH (ref 0.0–13.0)
NEUT#: 3.3 10*3/uL (ref 1.5–6.5)
NEUT%: 58.2 % (ref 40.0–80.0)
Platelets: 272 10*3/uL (ref 145–400)
RBC: 4.24 10*6/uL (ref 4.20–5.70)
RDW: 14.1 % (ref 11.1–15.7)
WBC: 5.6 10*3/uL (ref 4.0–10.0)

## 2013-08-06 LAB — IRON AND TIBC CHCC
%SAT: 10 % — ABNORMAL LOW (ref 20–55)
IRON: 23 ug/dL — AB (ref 42–163)
TIBC: 223 ug/dL (ref 202–409)
UIBC: 199 ug/dL (ref 117–376)

## 2013-08-06 LAB — FERRITIN CHCC: FERRITIN: 344 ng/mL — AB (ref 22–316)

## 2013-08-06 LAB — CHCC SATELLITE - SMEAR

## 2013-08-06 MED ORDER — DIPHENHYDRAMINE HCL 25 MG PO CAPS
ORAL_CAPSULE | ORAL | Status: AC
  Filled 2013-08-06: qty 1

## 2013-08-06 MED ORDER — ACETAMINOPHEN 325 MG PO TABS
650.0000 mg | ORAL_TABLET | Freq: Once | ORAL | Status: AC
  Administered 2013-08-06: 650 mg via ORAL

## 2013-08-06 MED ORDER — CYANOCOBALAMIN 1000 MCG/ML IJ SOLN
1000.0000 ug | Freq: Once | INTRAMUSCULAR | Status: AC
  Administered 2013-08-06: 1000 ug via INTRAMUSCULAR

## 2013-08-06 MED ORDER — TRAMADOL HCL 50 MG PO TABS: 50.0000 mg | ORAL_TABLET | Freq: Four times a day (QID) | ORAL | Status: AC | PRN

## 2013-08-06 MED ORDER — ACETAMINOPHEN 325 MG PO TABS
ORAL_TABLET | ORAL | Status: AC
  Filled 2013-08-06: qty 2

## 2013-08-06 MED ORDER — IMMUNE GLOBULIN (HUMAN) 10 GM/200ML IV SOLN: 40.0000 g | Freq: Once | INTRAVENOUS | Status: DC

## 2013-08-06 MED ORDER — SODIUM CHLORIDE 0.9 % IV SOLN
INTRAVENOUS | Status: DC
  Administered 2013-08-06: 10:00:00 via INTRAVENOUS

## 2013-08-06 MED ORDER — DIPHENHYDRAMINE HCL 25 MG PO CAPS
25.0000 mg | ORAL_CAPSULE | Freq: Once | ORAL | Status: AC
  Administered 2013-08-06: 25 mg via ORAL

## 2013-08-06 MED ORDER — IMMUNE GLOBULIN (HUMAN) 20 GM/200ML IV SOLN
40.0000 g | Freq: Once | INTRAVENOUS | Status: AC
  Administered 2013-08-06: 40 g via INTRAVENOUS
  Filled 2013-08-06: qty 400

## 2013-08-06 MED ORDER — CYANOCOBALAMIN 1000 MCG/ML IJ SOLN
INTRAMUSCULAR | Status: AC
  Filled 2013-08-06: qty 1

## 2013-08-06 NOTE — Progress Notes (Signed)
Hematology and Oncology Follow Up Visit  [name] [name] [id number] 1940/06/06 73 y.o. 08/06/2013   Principle Diagnosis:   Lymphoplasmacytic lymphoma-recurrent  Hypogammaglobulinemia with recurrent pneumonia  Alcohol induced neuropathy and dementia Vitamin B12 deficiency secondary to malabsorption  Current Therapy:   IVIG 40 g Q6 weeks Vitamin B12 1 mg IM every 6 weeks     Interim History:  Mr.  [name] is back for followup. He's doing okay. Last saw him back in March.  He's had nothing to drink for 4 weeks he says. He is pretty happy about this.  He's not had a problem falling. He's had a little bit of better strength in his legs.  He's had no cough or shortness of breath. He's had no bleeding. There's been no change in bowel or bladder habits.  We last saw him, his IgM level was 2080 mg/dL. Kappa light chain was 33.7 mg/dL. Monoclonal spike was 1.43 g/dL.  He's had no fever. He's had no leg swelling. He's had no rashes.  Medications: Current outpatient prescriptions:fish oil-omega-3 fatty acids 1000 MG capsule, Take 1 g by mouth daily. , Disp: , Rfl: ;  Pyridoxine HCl (VITAMIN B-6) 250 MG tablet, Take 1 tablet (250 mg total) by mouth daily., Disp: 30 tablet, Rfl: 12;  traMADol (ULTRAM) 50 MG tablet, Take 1 tablet (50 mg total) by mouth every 6 (six) hours as needed., Disp: 90 tablet, Rfl: 3 No current facility-administered medications for this visit. Facility-Administered Medications Ordered in Other Visits: 0.9 %  sodium chloride infusion, , Intravenous, Continuous, Volanda Napoleon, MD, Last Rate: 20 mL/hr at 08/06/13 3557;  Immune Globulin  10% SOLN 40 g, 40 g, Intravenous, Once, Volanda Napoleon, MD  Allergies: No Known Allergies  Past Medical History, Surgical history, Social history, and Family History were reviewed and updated.  Review of Systems: As above  Physical Exam:  height is 5\' 10"  (1.778 m) and weight is 147 lb (66.679 kg). His oral temperature is 97.9 F (36.6  C). His blood pressure is 86/52 and his pulse is 109. His respiration is 18.   Thin but well-nourished white gentleman in no obvious distress. Vital signs are as above. Head and neck exam shows no ocular or oral lesions. Is no palpable cervical or supraclavicular lymph nodes. Lungs are clear. Cardiac exam regular in rhythm with no murmurs rubs or bruits. Abdomen soft. Has good bowel sounds. There is no fluid wave. There is a palpable abdominal mass. There is a palpable liver or spleen tip. Back exam no tenderness over the spine ribs or hips. Extremities shows some muscle atrophy in upper lower extremities. He has decent range of motion of his joints. Muscle strength is 4/5 bilaterally. Skin exam shows no rashes act was or petechia. Neurological exam shows no focal neurological deficits. His memory may be a little better.  Lab Results  Component Value Date   WBC 5.6 08/06/2013   HGB 11.7* 08/06/2013   HCT 36.2* 08/06/2013   MCV 85 08/06/2013   PLT 272 08/06/2013     Chemistry      Component Value Date/Time   NA 138 06/25/2013 0757   NA 139 08/31/2012 1208   K 4.1 06/25/2013 0757   K 4.3 08/31/2012 1208   CL 103 06/25/2013 0757   CL 99 08/31/2012 1208   CO2 25 06/25/2013 0757   CO2 29 08/31/2012 1208   BUN 9 06/25/2013 0757   BUN 9 08/31/2012 1208   CREATININE 0.59 06/25/2013 0757  CREATININE 0.6 08/31/2012 1208      Component Value Date/Time   CALCIUM 8.6 06/25/2013 0757   CALCIUM 9.4 08/31/2012 1208   ALKPHOS 59 06/25/2013 0757   ALKPHOS 64 08/31/2012 1208   AST 8 06/25/2013 0757   AST 14 08/31/2012 1208   ALT <8 06/25/2013 0757   ALT 19 08/31/2012 1208   BILITOT 0.3 06/25/2013 0757   BILITOT 0.60 08/31/2012 1208         Impression and Plan: [name] [name] is 73 year old done. In the past history of lymphoplasmacytic lymphoma. He was treated for this. He had a partial remission. He had other health issues so I do not think we had to move ahead with any additional therapy on him.  We are giving him  IVIG because of his history of recurrent pneumonia.  His IgM level has been trending upward. So far, he is asymptomatic with this. He's had no change with his blood counts. I don't see any evidence of hyperviscosity issues.   for now, we will go ahead with his IVIG. He will also get vitamin B12. I will see what his iron studies look like. His MCV is down a little bit.  I would like to see him back in another 6 weeks. I did refill his tramadol.      Volanda Napoleon, MD 4/24/20159:59 AM

## 2013-08-06 NOTE — Patient Instructions (Signed)

## 2013-08-11 LAB — COMPREHENSIVE METABOLIC PANEL
ALT: 9 U/L (ref 0–53)
AST: 12 U/L (ref 0–37)
Albumin: 3 g/dL — ABNORMAL LOW (ref 3.5–5.2)
Alkaline Phosphatase: 67 U/L (ref 39–117)
BUN: 8 mg/dL (ref 6–23)
CO2: 25 mEq/L (ref 19–32)
CREATININE: 0.7 mg/dL (ref 0.50–1.35)
Calcium: 8.8 mg/dL (ref 8.4–10.5)
Chloride: 96 mEq/L (ref 96–112)
GLUCOSE: 151 mg/dL — AB (ref 70–99)
Potassium: 4.3 mEq/L (ref 3.5–5.3)
SODIUM: 135 meq/L (ref 135–145)
TOTAL PROTEIN: 7 g/dL (ref 6.0–8.3)
Total Bilirubin: 0.3 mg/dL (ref 0.2–1.2)

## 2013-08-11 LAB — PROTEIN ELECTROPHORESIS, SERUM, WITH REFLEX
ALBUMIN ELP: 37.6 % — AB (ref 55.8–66.1)
ALPHA-1-GLOBULIN: 8.8 % — AB (ref 2.9–4.9)
ALPHA-2-GLOBULIN: 12.9 % — AB (ref 7.1–11.8)
Beta 2: 25.8 % — ABNORMAL HIGH (ref 3.2–6.5)
Beta Globulin: 6.4 % (ref 4.7–7.2)
Gamma Globulin: 8.5 % — ABNORMAL LOW (ref 11.1–18.8)
M-Spike, %: 1.72 g/dL
TOTAL PROTEIN, SERUM ELECTROPHOR: 7 g/dL (ref 6.0–8.3)

## 2013-08-11 LAB — IGG, IGA, IGM
IGA: 11 mg/dL — AB (ref 68–379)
IGG (IMMUNOGLOBIN G), SERUM: 683 mg/dL (ref 650–1600)
IgM, Serum: 2270 mg/dL — ABNORMAL HIGH (ref 41–251)

## 2013-08-11 LAB — KAPPA/LAMBDA LIGHT CHAINS
KAPPA LAMBDA RATIO: 31.2 — AB (ref 0.26–1.65)
Kappa free light chain: 33.7 mg/dL — ABNORMAL HIGH (ref 0.33–1.94)
Lambda Free Lght Chn: 1.08 mg/dL (ref 0.57–2.63)

## 2013-08-11 LAB — IFE INTERPRETATION

## 2013-09-17 ENCOUNTER — Encounter: Payer: Self-pay | Admitting: Hematology & Oncology

## 2013-09-17 ENCOUNTER — Ambulatory Visit (HOSPITAL_BASED_OUTPATIENT_CLINIC_OR_DEPARTMENT_OTHER): Payer: Medicare Other

## 2013-09-17 ENCOUNTER — Ambulatory Visit (HOSPITAL_BASED_OUTPATIENT_CLINIC_OR_DEPARTMENT_OTHER): Payer: Medicare Other | Admitting: Hematology & Oncology

## 2013-09-17 ENCOUNTER — Ambulatory Visit (HOSPITAL_BASED_OUTPATIENT_CLINIC_OR_DEPARTMENT_OTHER): Payer: Medicare Other | Admitting: Lab

## 2013-09-17 VITALS — BP 88/53 | HR 110 | Temp 97.9°F | Resp 20 | Ht 68.0 in | Wt 143.0 lb

## 2013-09-17 VITALS — BP 90/54 | HR 77 | Temp 96.7°F | Resp 18

## 2013-09-17 DIAGNOSIS — E538 Deficiency of other specified B group vitamins: Secondary | ICD-10-CM

## 2013-09-17 DIAGNOSIS — D500 Iron deficiency anemia secondary to blood loss (chronic): Secondary | ICD-10-CM

## 2013-09-17 DIAGNOSIS — D801 Nonfamilial hypogammaglobulinemia: Secondary | ICD-10-CM

## 2013-09-17 DIAGNOSIS — G893 Neoplasm related pain (acute) (chronic): Secondary | ICD-10-CM

## 2013-09-17 DIAGNOSIS — C880 Waldenstrom macroglobulinemia: Secondary | ICD-10-CM

## 2013-09-17 DIAGNOSIS — K909 Intestinal malabsorption, unspecified: Secondary | ICD-10-CM

## 2013-09-17 DIAGNOSIS — M898X9 Other specified disorders of bone, unspecified site: Principal | ICD-10-CM

## 2013-09-17 DIAGNOSIS — D6481 Anemia due to antineoplastic chemotherapy: Secondary | ICD-10-CM

## 2013-09-17 DIAGNOSIS — C8589 Other specified types of non-Hodgkin lymphoma, extranodal and solid organ sites: Secondary | ICD-10-CM

## 2013-09-17 DIAGNOSIS — D509 Iron deficiency anemia, unspecified: Secondary | ICD-10-CM

## 2013-09-17 DIAGNOSIS — D510 Vitamin B12 deficiency anemia due to intrinsic factor deficiency: Secondary | ICD-10-CM

## 2013-09-17 DIAGNOSIS — T451X5A Adverse effect of antineoplastic and immunosuppressive drugs, initial encounter: Principal | ICD-10-CM

## 2013-09-17 DIAGNOSIS — G5790 Unspecified mononeuropathy of unspecified lower limb: Secondary | ICD-10-CM

## 2013-09-17 LAB — CBC WITH DIFFERENTIAL (CANCER CENTER ONLY)
BASO#: 0.1 10*3/uL (ref 0.0–0.2)
BASO%: 0.9 % (ref 0.0–2.0)
EOS ABS: 0.1 10*3/uL (ref 0.0–0.5)
EOS%: 1.7 % (ref 0.0–7.0)
HEMATOCRIT: 33 % — AB (ref 38.7–49.9)
HGB: 10.7 g/dL — ABNORMAL LOW (ref 13.0–17.1)
LYMPH#: 1.3 10*3/uL (ref 0.9–3.3)
LYMPH%: 24.7 % (ref 14.0–48.0)
MCH: 26.9 pg — ABNORMAL LOW (ref 28.0–33.4)
MCHC: 32.4 g/dL (ref 32.0–35.9)
MCV: 83 fL (ref 82–98)
MONO#: 0.9 10*3/uL (ref 0.1–0.9)
MONO%: 17 % — ABNORMAL HIGH (ref 0.0–13.0)
NEUT#: 3 10*3/uL (ref 1.5–6.5)
NEUT%: 55.7 % (ref 40.0–80.0)
Platelets: 269 10*3/uL (ref 145–400)
RBC: 3.98 10*6/uL — AB (ref 4.20–5.70)
RDW: 14 % (ref 11.1–15.7)
WBC: 5.4 10*3/uL (ref 4.0–10.0)

## 2013-09-17 LAB — CMP (CANCER CENTER ONLY)
ALK PHOS: 59 U/L (ref 26–84)
ALT(SGPT): 8 U/L — ABNORMAL LOW (ref 10–47)
AST: 15 U/L (ref 11–38)
Albumin: 2.4 g/dL — ABNORMAL LOW (ref 3.3–5.5)
BUN: 12 mg/dL (ref 7–22)
CALCIUM: 9 mg/dL (ref 8.0–10.3)
CO2: 29 mEq/L (ref 18–33)
Chloride: 98 mEq/L (ref 98–108)
Creat: 0.9 mg/dl (ref 0.6–1.2)
Glucose, Bld: 164 mg/dL — ABNORMAL HIGH (ref 73–118)
Potassium: 3.8 mEq/L (ref 3.3–4.7)
Sodium: 137 mEq/L (ref 128–145)
Total Bilirubin: 0.4 mg/dl (ref 0.20–1.60)
Total Protein: 8.2 g/dL — ABNORMAL HIGH (ref 6.4–8.1)

## 2013-09-17 LAB — FERRITIN CHCC: Ferritin: 415 ng/ml — ABNORMAL HIGH (ref 22–316)

## 2013-09-17 LAB — IRON AND TIBC CHCC
%SAT: 11 % — ABNORMAL LOW (ref 20–55)
Iron: 21 ug/dL — ABNORMAL LOW (ref 42–163)
TIBC: 199 ug/dL — ABNORMAL LOW (ref 202–409)
UIBC: 178 ug/dL (ref 117–376)

## 2013-09-17 MED ORDER — CYANOCOBALAMIN 1000 MCG/ML IJ SOLN
1000.0000 ug | Freq: Once | INTRAMUSCULAR | Status: AC
  Administered 2013-09-17: 1000 ug via INTRAMUSCULAR

## 2013-09-17 MED ORDER — SODIUM CHLORIDE 0.9 % IV SOLN
1020.0000 mg | Freq: Once | INTRAVENOUS | Status: AC
  Administered 2013-09-17: 1020 mg via INTRAVENOUS
  Filled 2013-09-17: qty 34

## 2013-09-17 MED ORDER — DIPHENHYDRAMINE HCL 25 MG PO TABS
25.0000 mg | ORAL_TABLET | Freq: Once | ORAL | Status: AC
  Administered 2013-09-17: 25 mg via ORAL
  Filled 2013-09-17: qty 1

## 2013-09-17 MED ORDER — ACETAMINOPHEN 325 MG PO TABS
ORAL_TABLET | ORAL | Status: AC
  Filled 2013-09-17: qty 2

## 2013-09-17 MED ORDER — ACETAMINOPHEN 325 MG PO TABS
650.0000 mg | ORAL_TABLET | Freq: Once | ORAL | Status: AC
  Administered 2013-09-17: 650 mg via ORAL

## 2013-09-17 MED ORDER — IMMUNE GLOBULIN (HUMAN) 10 GM/100ML IV SOLN
0.6000 g/kg | Freq: Once | INTRAVENOUS | Status: AC
  Administered 2013-09-17: 40 g via INTRAVENOUS
  Filled 2013-09-17: qty 400

## 2013-09-17 MED ORDER — IMMUNE GLOBULIN (HUMAN) 10 GM/200ML IV SOLN: 40.0000 g | Freq: Once | INTRAVENOUS | Status: DC

## 2013-09-17 MED ORDER — CYANOCOBALAMIN 1000 MCG/ML IJ SOLN
INTRAMUSCULAR | Status: AC
  Filled 2013-09-17: qty 1

## 2013-09-17 MED ORDER — DIPHENHYDRAMINE HCL 25 MG PO CAPS
ORAL_CAPSULE | ORAL | Status: AC
  Filled 2013-09-17: qty 1

## 2013-09-17 NOTE — Patient Instructions (Signed)

## 2013-09-17 NOTE — Progress Notes (Signed)
Hematology and Oncology Follow Up Visit  [name] [name] [id number] [birth year] 73 y.o. 09/17/2013   Principle Diagnosis:   Lymphoplasmacytic lymphoma-recurrent  Hypogammaglobulinemia with recurrent pneumonia  Alcohol induced neuropathy and dementia Vitamin B12 deficiency secondary to malabsorption  Current Therapy:   VIG 40 g Q6 weeks Vitamin B12 1 mg IM every 6 weeks         Interim History:  [name] [name] is is not feeling all that good. He does not have a lot of energy. He feels tired.  He's not drinking. His eating a little bit better he says. Am not sure if he is taking his vitamin B6. He still has the neuropathy in his feet.  We last saw him in April, his monoclonal spike was 1.72 g/dL. His IgM level was 2270 mg/dL. His iron studies showed a ferritin of 344 with an iron saturation of 10%.  He's had no fever. He's had no obvious bleeding. He's had no leg swelling.  Medications: Current outpatient prescriptions:fish oil-omega-3 fatty acids 1000 MG capsule, Take 1 g by mouth daily. , Disp: , Rfl: ;  Pyridoxine HCl (VITAMIN B-6) 250 MG tablet, Take 1 tablet (250 mg total) by mouth daily., Disp: 30 tablet, Rfl: 12;  traMADol (ULTRAM) 50 MG tablet, Take 1 tablet (50 mg total) by mouth every 6 (six) hours as needed., Disp: 90 tablet, Rfl: 3  Allergies: No Known Allergies  Past Medical History, Surgical history, Social history, and Family History were reviewed and updated.  Review of Systems: As above  Physical Exam:  height is 5\' 8"  (1.727 m) and weight is 143 lb (64.864 kg). His oral temperature is 97.9 F (36.6 C). His blood pressure is 88/53 and his pulse is 110. His respiration is 20.   Thin white gentleman. Lungs are clear. Cardiac exam regular in rhythm. Abdomen is soft. There is no palpable liver or spleen tip. Axillary exam shows no obvious axillary lymph nodes. Exam shows some slight kyphosis. Extremities shows some muscle atrophy in his arms and legs. Skin exam shows no  rashes. Neurological exam is nonfocal.  Lab Results  Component Value Date   WBC 5.4 09/17/2013   HGB 10.7* 09/17/2013   HCT 33.0* 09/17/2013   MCV 83 09/17/2013   PLT 269 09/17/2013     Chemistry      Component Value Date/Time   NA 137 09/17/2013 0842   NA 135 08/06/2013 0851   K 3.8 09/17/2013 0842   K 4.3 08/06/2013 0851   CL 98 09/17/2013 0842   CL 96 08/06/2013 0851   CO2 29 09/17/2013 0842   CO2 25 08/06/2013 0851   BUN 12 09/17/2013 0842   BUN 8 08/06/2013 0851   CREATININE 0.9 09/17/2013 0842   CREATININE 0.70 08/06/2013 0851      Component Value Date/Time   CALCIUM 9.0 09/17/2013 0842   CALCIUM 8.8 08/06/2013 0851   ALKPHOS 59 09/17/2013 0842   ALKPHOS 67 08/06/2013 0851   AST 15 09/17/2013 0842   AST 12 08/06/2013 0851   ALT 8* 09/17/2013 0842   ALT 9 08/06/2013 0851   BILITOT 0.40 09/17/2013 0842   BILITOT 0.3 08/06/2013 0851         Impression and Plan: [name] [name] is 73 year old gentleman. He has a history of lympho-plasmacytic lymphoma. It is very troublesome that his monoclonal spike has been going up. I just wonder that he is relapsing.  If we find that his monoclonal spike continues to rise, and that we  are going to have to consider some type of intervention. I know that we can use one of the new targeted drugs which are oral. I think she would do well with this.  I will give him iron today. His saturation is only 11%. Hopefully this will make him feel a little better.  I want to see him back in another 4-5 weeks.   Volanda Napoleon, MD 6/5/20157:01 PM

## 2013-09-21 LAB — IGG, IGA, IGM
IGG (IMMUNOGLOBIN G), SERUM: 650 mg/dL (ref 650–1600)
IgA: 10 mg/dL — ABNORMAL LOW (ref 68–379)
IgM, Serum: 2810 mg/dL — ABNORMAL HIGH (ref 41–251)

## 2013-09-21 LAB — PROTEIN ELECTROPHORESIS, SERUM, WITH REFLEX
ALBUMIN ELP: 36.3 % — AB (ref 55.8–66.1)
Alpha-1-Globulin: 10.1 % — ABNORMAL HIGH (ref 2.9–4.9)
Alpha-2-Globulin: 13.2 % — ABNORMAL HIGH (ref 7.1–11.8)
BETA 2: 26.5 % — AB (ref 3.2–6.5)
Beta Globulin: 6.3 % (ref 4.7–7.2)
Gamma Globulin: 7.6 % — ABNORMAL LOW (ref 11.1–18.8)
M-SPIKE, %: 1.74 g/dL
TOTAL PROTEIN, SERUM ELECTROPHOR: 7 g/dL (ref 6.0–8.3)

## 2013-09-21 LAB — IFE INTERPRETATION

## 2013-09-21 LAB — KAPPA/LAMBDA LIGHT CHAINS
Kappa free light chain: 49.4 mg/dL — ABNORMAL HIGH (ref 0.33–1.94)
Kappa:Lambda Ratio: 83.73 — ABNORMAL HIGH (ref 0.26–1.65)
LAMBDA FREE LGHT CHN: 0.59 mg/dL (ref 0.57–2.63)

## 2013-09-24 ENCOUNTER — Encounter (HOSPITAL_COMMUNITY): Payer: Self-pay

## 2013-10-29 ENCOUNTER — Ambulatory Visit (HOSPITAL_BASED_OUTPATIENT_CLINIC_OR_DEPARTMENT_OTHER): Payer: Medicare Other | Admitting: Hematology & Oncology

## 2013-10-29 ENCOUNTER — Encounter: Payer: Self-pay | Admitting: Hematology & Oncology

## 2013-10-29 ENCOUNTER — Ambulatory Visit (HOSPITAL_BASED_OUTPATIENT_CLINIC_OR_DEPARTMENT_OTHER): Payer: Medicare Other | Admitting: Lab

## 2013-10-29 ENCOUNTER — Ambulatory Visit (HOSPITAL_BASED_OUTPATIENT_CLINIC_OR_DEPARTMENT_OTHER): Payer: Medicare Other

## 2013-10-29 VITALS — BP 94/61 | HR 68 | Temp 97.8°F

## 2013-10-29 VITALS — BP 92/58 | HR 98 | Temp 97.8°F | Resp 18 | Ht 68.0 in | Wt 137.0 lb

## 2013-10-29 DIAGNOSIS — E538 Deficiency of other specified B group vitamins: Secondary | ICD-10-CM

## 2013-10-29 DIAGNOSIS — R64 Cachexia: Secondary | ICD-10-CM

## 2013-10-29 DIAGNOSIS — D510 Vitamin B12 deficiency anemia due to intrinsic factor deficiency: Secondary | ICD-10-CM

## 2013-10-29 DIAGNOSIS — C8589 Other specified types of non-Hodgkin lymphoma, extranodal and solid organ sites: Secondary | ICD-10-CM

## 2013-10-29 DIAGNOSIS — J449 Chronic obstructive pulmonary disease, unspecified: Secondary | ICD-10-CM

## 2013-10-29 DIAGNOSIS — C880 Waldenstrom macroglobulinemia: Secondary | ICD-10-CM

## 2013-10-29 DIAGNOSIS — D6481 Anemia due to antineoplastic chemotherapy: Secondary | ICD-10-CM

## 2013-10-29 DIAGNOSIS — D500 Iron deficiency anemia secondary to blood loss (chronic): Secondary | ICD-10-CM

## 2013-10-29 DIAGNOSIS — D839 Common variable immunodeficiency, unspecified: Secondary | ICD-10-CM

## 2013-10-29 DIAGNOSIS — T451X5A Adverse effect of antineoplastic and immunosuppressive drugs, initial encounter: Principal | ICD-10-CM

## 2013-10-29 LAB — CMP (CANCER CENTER ONLY)
ALT(SGPT): 10 U/L (ref 10–47)
AST: 14 U/L (ref 11–38)
Albumin: 2.6 g/dL — ABNORMAL LOW (ref 3.3–5.5)
Alkaline Phosphatase: 53 U/L (ref 26–84)
BILIRUBIN TOTAL: 0.5 mg/dL (ref 0.20–1.60)
BUN, Bld: 8 mg/dL (ref 7–22)
CALCIUM: 9.1 mg/dL (ref 8.0–10.3)
CHLORIDE: 97 meq/L — AB (ref 98–108)
CO2: 29 mEq/L (ref 18–33)
Creat: 0.6 mg/dl (ref 0.6–1.2)
Glucose, Bld: 101 mg/dL (ref 73–118)
Potassium: 3.7 mEq/L (ref 3.3–4.7)
SODIUM: 135 meq/L (ref 128–145)
TOTAL PROTEIN: 8.1 g/dL (ref 6.4–8.1)

## 2013-10-29 LAB — CBC WITH DIFFERENTIAL (CANCER CENTER ONLY)
BASO#: 0.1 10*3/uL (ref 0.0–0.2)
BASO%: 1.6 % (ref 0.0–2.0)
EOS%: 1.7 % (ref 0.0–7.0)
Eosinophils Absolute: 0.1 10*3/uL (ref 0.0–0.5)
HCT: 35.6 % — ABNORMAL LOW (ref 38.7–49.9)
HEMOGLOBIN: 11.6 g/dL — AB (ref 13.0–17.1)
LYMPH#: 1.6 10*3/uL (ref 0.9–3.3)
LYMPH%: 27.6 % (ref 14.0–48.0)
MCH: 28.4 pg (ref 28.0–33.4)
MCHC: 32.6 g/dL (ref 32.0–35.9)
MCV: 87 fL (ref 82–98)
MONO#: 1.2 10*3/uL — ABNORMAL HIGH (ref 0.1–0.9)
MONO%: 21 % — ABNORMAL HIGH (ref 0.0–13.0)
NEUT%: 48.1 % (ref 40.0–80.0)
NEUTROS ABS: 2.8 10*3/uL (ref 1.5–6.5)
Platelets: 266 10*3/uL (ref 145–400)
RBC: 4.09 10*6/uL — ABNORMAL LOW (ref 4.20–5.70)
RDW: 15.6 % (ref 11.1–15.7)
WBC: 5.7 10*3/uL (ref 4.0–10.0)

## 2013-10-29 LAB — FERRITIN CHCC: FERRITIN: 761 ng/mL — AB (ref 22–316)

## 2013-10-29 LAB — IRON AND TIBC CHCC
%SAT: 23 % (ref 20–55)
Iron: 42 ug/dL (ref 42–163)
TIBC: 184 ug/dL — ABNORMAL LOW (ref 202–409)
UIBC: 142 ug/dL (ref 117–376)

## 2013-10-29 MED ORDER — CYANOCOBALAMIN 1000 MCG/ML IJ SOLN
INTRAMUSCULAR | Status: AC
  Filled 2013-10-29: qty 1

## 2013-10-29 MED ORDER — DIPHENHYDRAMINE HCL 25 MG PO TABS
25.0000 mg | ORAL_TABLET | Freq: Once | ORAL | Status: AC
  Administered 2013-10-29: 25 mg via ORAL
  Filled 2013-10-29: qty 1

## 2013-10-29 MED ORDER — IMMUNE GLOBULIN (HUMAN) 10 GM/100ML IV SOLN
40.0000 g | Freq: Once | INTRAVENOUS | Status: AC
  Administered 2013-10-29: 40 g via INTRAVENOUS
  Filled 2013-10-29: qty 400

## 2013-10-29 MED ORDER — ACETAMINOPHEN 325 MG PO TABS
650.0000 mg | ORAL_TABLET | Freq: Once | ORAL | Status: AC
  Administered 2013-10-29: 650 mg via ORAL

## 2013-10-29 MED ORDER — DIPHENHYDRAMINE HCL 25 MG PO CAPS
ORAL_CAPSULE | ORAL | Status: AC
  Filled 2013-10-29: qty 1

## 2013-10-29 MED ORDER — SODIUM CHLORIDE 0.9 % IV SOLN
Freq: Once | INTRAVENOUS | Status: AC
  Administered 2013-10-29: 09:00:00 via INTRAVENOUS

## 2013-10-29 MED ORDER — ACETAMINOPHEN 325 MG PO TABS
ORAL_TABLET | ORAL | Status: AC
  Filled 2013-10-29: qty 2

## 2013-10-29 MED ORDER — CYANOCOBALAMIN 1000 MCG/ML IJ SOLN
1000.0000 ug | Freq: Once | INTRAMUSCULAR | Status: AC
  Administered 2013-10-29: 1000 ug via INTRAMUSCULAR

## 2013-10-29 NOTE — Patient Instructions (Signed)

## 2013-10-31 MED ORDER — MEGESTROL ACETATE 625 MG/5ML PO SUSP: 625.0000 mg | Freq: Every day | ORAL | Status: DC

## 2013-10-31 NOTE — Progress Notes (Signed)
Hematology and Oncology Follow Up Visit  [name] [name] [id number] May 24, 1940 73 y.o. 10/31/2013   Principle Diagnosis:   Lymphoplasmacytic lymphoma-recurrent  Hypogammaglobulinemia with recurrent pneumonia  Alcohol induced neuropathy and dementia       Vitamin B12 deficiency secondary to malabsorption  Current Therapy:   IVIG 40 g Q6 weeks Vitamin B12 1 mg IM every 6 weeks     Interim History:  [name] [name] is back for followup. He has lost some weight. He is hard to say what his appetite is like. I will put him on an appetite stimulant.  I worry about the lymphoma coming back. I really think this might be an issue for him. His monoclonal studies show his IgM is now 3150 mg/dL. I assure that the monoclonal spike is a higher.  I think we will have to we evaluate him. I think restaging with scans and probably a bone marrow is probably what we need to do now.  He is not drinking. He is not smoking.  He really does not have any pain. There's been no cough. He's had no nausea or vomiting. There's been no leg swelling. He's had no rashes.  Overall, his performance status is ECOG 2      Medications: Current outpatient prescriptions:fish oil-omega-3 fatty acids 1000 MG capsule, Take 1 g by mouth daily. , Disp: , Rfl: ;  Pyridoxine HCl (VITAMIN B-6) 250 MG tablet, Take 1 tablet (250 mg total) by mouth daily., Disp: 30 tablet, Rfl: 12;  traMADol (ULTRAM) 50 MG tablet, Take 1 tablet (50 mg total) by mouth every 6 (six) hours as needed., Disp: 90 tablet, Rfl: 3  Allergies: No Known Allergies  Past Medical History, Surgical history, Social history, and Family History were reviewed and updated.  Review of Systems: As above  Physical Exam:  height is 5\' 8"  (1.727 m) and weight is 137 lb (62.143 kg). His oral temperature is 97.8 F (36.6 C). His blood pressure is 92/58 and his pulse is 98. His respiration is 18.   Thin white gentleman in no obvious distress. Head and neck exam shows no  ocular or oral lesions. He is no palpable cervical or supraclavicular lymph nodes. Lungs are clear bilaterally. Cardiac exam regular in rhythm with a 1/6 systolic ejection murmur. Abdomen is soft. She has good bowel sounds. There is no fluid wave. There is no palpable liver or spleen tip. Back exam no tenderness over the spine ribs or hips. Extremities shows no clubbing cyanosis or edema. He has some age related osteoarthritic changes in his joints. He has decent strength. Skin exam no rashes kyphos or petechia. Neurological exam shows no focal neurological deficits.  Lab Results  Component Value Date   WBC 5.7 10/29/2013   HGB 11.6* 10/29/2013   HCT 35.6* 10/29/2013   MCV 87 10/29/2013   PLT 266 10/29/2013     Chemistry      Component Value Date/Time   NA 135 10/29/2013 0801   NA 135 08/06/2013 0851   K 3.7 10/29/2013 0801   K 4.3 08/06/2013 0851   CL 97* 10/29/2013 0801   CL 96 08/06/2013 0851   CO2 29 10/29/2013 0801   CO2 25 08/06/2013 0851   BUN 8 10/29/2013 0801   BUN 8 08/06/2013 0851   CREATININE 0.6 10/29/2013 0801   CREATININE 0.70 08/06/2013 0851      Component Value Date/Time   CALCIUM 9.1 10/29/2013 0801   CALCIUM 8.8 08/06/2013 0851   ALKPHOS 53 10/29/2013  0801   ALKPHOS 67 08/06/2013 0851   AST 14 10/29/2013 0801   AST 12 08/06/2013 0851   ALT 10 10/29/2013 0801   ALT 9 08/06/2013 0851   BILITOT 0.50 10/29/2013 0801   BILITOT 0.3 08/06/2013 0851         Impression and Plan: [name] [name] is 73 year old gentleman. He has a history of lymphoplasmacytic lymphoma. He was treated for this with Rituxan-CVP. He has not had any chemotherapy for a couple years.  Again, I believe that his lymphoma is coming back. The IgM level continues to increase.  I'll have to set him up with our studies. I'll need to see what we can do a bone marrow on him.  I think that we probably could use Imburvica. This has been approved for [employer], which is what [name] [name] has. I think this be well tolerated. I  think this will also work. Another option would be Velcade. Another option would be Treanda. I think if we can try oral therapy this might be easiest for [name] [name].  I spent about 40 minutes with him. I was explaining what was going on. I was explaining why we had do the new studies.  I will have to talk to his family and let him know what is going on.  I will try some Megace on him. Maybe this will help with the appetite.  I want to see him back in 3 weeks.   Volanda Napoleon, MD 7/19/20159:16 AM

## 2013-11-01 ENCOUNTER — Other Ambulatory Visit: Payer: Self-pay | Admitting: *Deleted

## 2013-11-01 ENCOUNTER — Telehealth: Payer: Self-pay | Admitting: Hematology & Oncology

## 2013-11-01 NOTE — Telephone Encounter (Signed)
Called son to schedule CT he wanted to speak to RN. I transferred call

## 2013-11-02 ENCOUNTER — Telehealth: Payer: Self-pay | Admitting: Hematology & Oncology

## 2013-11-02 LAB — PROTEIN ELECTROPHORESIS, SERUM, WITH REFLEX
ALPHA-2-GLOBULIN: 12.6 % — AB (ref 7.1–11.8)
Albumin ELP: 34.7 % — ABNORMAL LOW (ref 55.8–66.1)
Alpha-1-Globulin: 9.9 % — ABNORMAL HIGH (ref 2.9–4.9)
BETA GLOBULIN: 6 % (ref 4.7–7.2)
Beta 2: 28.7 % — ABNORMAL HIGH (ref 3.2–6.5)
GAMMA GLOBULIN: 8.1 % — AB (ref 11.1–18.8)
M-SPIKE, %: 1.85 g/dL
Total Protein, Serum Electrophoresis: 7 g/dL (ref 6.0–8.3)

## 2013-11-02 LAB — KAPPA/LAMBDA LIGHT CHAINS
Kappa free light chain: 60.3 mg/dL — ABNORMAL HIGH (ref 0.33–1.94)
Kappa:Lambda Ratio: 118.24 — ABNORMAL HIGH (ref 0.26–1.65)
Lambda Free Lght Chn: 0.51 mg/dL — ABNORMAL LOW (ref 0.57–2.63)

## 2013-11-02 LAB — IGG, IGA, IGM
IGA: 8 mg/dL — AB (ref 68–379)
IgG (Immunoglobin G), Serum: 603 mg/dL — ABNORMAL LOW (ref 650–1600)
IgM, Serum: 3150 mg/dL — ABNORMAL HIGH (ref 41–251)

## 2013-11-02 LAB — IFE INTERPRETATION

## 2013-11-02 NOTE — Telephone Encounter (Signed)
Pt aware of 7-24 CT to be npo 4 hrs and to drink contrast, he is also aware of 8-11 MD. Son has number to scheduling in case they need to change scan

## 2013-11-05 ENCOUNTER — Ambulatory Visit (HOSPITAL_COMMUNITY)
Admission: RE | Admit: 2013-11-05 | Discharge: 2013-11-05 | Disposition: A | Discharge status: Home / Self Care | Payer: Medicare Other | Source: Ambulatory Visit | Attending: Hematology & Oncology | Admitting: Hematology & Oncology

## 2013-11-05 DIAGNOSIS — I2510 Atherosclerotic heart disease of native coronary artery without angina pectoris: Secondary | ICD-10-CM | POA: Insufficient documentation

## 2013-11-05 DIAGNOSIS — R634 Abnormal weight loss: Secondary | ICD-10-CM | POA: Insufficient documentation

## 2013-11-05 DIAGNOSIS — C8589 Other specified types of non-Hodgkin lymphoma, extranodal and solid organ sites: Secondary | ICD-10-CM | POA: Diagnosis not present

## 2013-11-05 DIAGNOSIS — R509 Fever, unspecified: Secondary | ICD-10-CM | POA: Diagnosis not present

## 2013-11-05 DIAGNOSIS — I700 Atherosclerosis of aorta: Secondary | ICD-10-CM | POA: Diagnosis not present

## 2013-11-05 DIAGNOSIS — J4489 Other specified chronic obstructive pulmonary disease: Secondary | ICD-10-CM | POA: Insufficient documentation

## 2013-11-05 DIAGNOSIS — J449 Chronic obstructive pulmonary disease, unspecified: Secondary | ICD-10-CM | POA: Insufficient documentation

## 2013-11-05 DIAGNOSIS — R599 Enlarged lymph nodes, unspecified: Secondary | ICD-10-CM | POA: Insufficient documentation

## 2013-11-05 DIAGNOSIS — R091 Pleurisy: Secondary | ICD-10-CM | POA: Diagnosis not present

## 2013-11-05 DIAGNOSIS — R918 Other nonspecific abnormal finding of lung field: Secondary | ICD-10-CM | POA: Insufficient documentation

## 2013-11-05 DIAGNOSIS — C880 Waldenstrom macroglobulinemia: Secondary | ICD-10-CM

## 2013-11-05 DIAGNOSIS — I7090 Unspecified atherosclerosis: Secondary | ICD-10-CM | POA: Diagnosis not present

## 2013-11-05 DIAGNOSIS — F1910 Other psychoactive substance abuse, uncomplicated: Secondary | ICD-10-CM | POA: Diagnosis not present

## 2013-11-05 MED ORDER — IOHEXOL 300 MG/ML  SOLN
100.0000 mL | Freq: Once | INTRAMUSCULAR | Status: AC | PRN
  Administered 2013-11-05: 100 mL via INTRAVENOUS

## 2013-11-10 ENCOUNTER — Other Ambulatory Visit: Payer: Self-pay | Admitting: *Deleted

## 2013-11-10 ENCOUNTER — Other Ambulatory Visit: Payer: Self-pay | Admitting: Nurse Practitioner

## 2013-11-10 DIAGNOSIS — C880 Waldenstrom macroglobulinemia: Secondary | ICD-10-CM

## 2013-11-10 MED ORDER — IBRUTINIB 140 MG PO CAPS: 420.0000 mg | ORAL_CAPSULE | Freq: Every day | ORAL | Status: DC

## 2013-11-10 NOTE — Telephone Encounter (Signed)
RX, demographics, insurance card, and [employer] referral faxed for [name] [name]. Will await a response.

## 2013-11-15 ENCOUNTER — Encounter: Payer: Self-pay | Admitting: *Deleted

## 2013-11-15 NOTE — Progress Notes (Signed)
Received phone call from [employer] stating that pt was approved for Imbruvica.

## 2013-11-16 ENCOUNTER — Telehealth: Payer: Self-pay | Admitting: Hematology & Oncology

## 2013-11-16 NOTE — Telephone Encounter (Signed)
Pt has been approved for reimbursement for cost sharing associated with specific, documented out-of-pocket medication expenses related to [facility].   Valid: 11/15/2013-11/15/2014 Grant: $7,500 PRN: [id number]      Patient [name] [name]  P: [place]

## 2013-11-23 ENCOUNTER — Ambulatory Visit: Payer: Medicare Other | Admitting: Hematology & Oncology

## 2013-11-23 ENCOUNTER — Other Ambulatory Visit: Payer: Medicare Other | Admitting: Lab

## 2013-11-30 ENCOUNTER — Telehealth: Payer: Self-pay | Admitting: Hematology & Oncology

## 2013-11-30 NOTE — Telephone Encounter (Signed)
[place] approved MEGACE ES.    Auth: [id number] Case: [id number]   Dates: 11/01/2013 - 04/14/2014      P: [phone]     COPY SCANNED

## 2013-12-03 ENCOUNTER — Ambulatory Visit (HOSPITAL_BASED_OUTPATIENT_CLINIC_OR_DEPARTMENT_OTHER): Payer: Medicare Other | Admitting: Hematology & Oncology

## 2013-12-03 ENCOUNTER — Ambulatory Visit (HOSPITAL_BASED_OUTPATIENT_CLINIC_OR_DEPARTMENT_OTHER): Payer: Medicare Other | Admitting: Lab

## 2013-12-03 ENCOUNTER — Encounter: Payer: Self-pay | Admitting: Hematology & Oncology

## 2013-12-03 ENCOUNTER — Ambulatory Visit (HOSPITAL_BASED_OUTPATIENT_CLINIC_OR_DEPARTMENT_OTHER): Payer: Medicare Other

## 2013-12-03 VITALS — BP 95/58 | HR 92 | Temp 97.6°F | Resp 16 | Ht 68.0 in | Wt 139.0 lb

## 2013-12-03 VITALS — BP 100/62 | HR 78 | Temp 98.2°F | Resp 16

## 2013-12-03 DIAGNOSIS — E538 Deficiency of other specified B group vitamins: Secondary | ICD-10-CM

## 2013-12-03 DIAGNOSIS — T451X5A Adverse effect of antineoplastic and immunosuppressive drugs, initial encounter: Principal | ICD-10-CM

## 2013-12-03 DIAGNOSIS — D6481 Anemia due to antineoplastic chemotherapy: Secondary | ICD-10-CM

## 2013-12-03 DIAGNOSIS — C8589 Other specified types of non-Hodgkin lymphoma, extranodal and solid organ sites: Secondary | ICD-10-CM

## 2013-12-03 DIAGNOSIS — D510 Vitamin B12 deficiency anemia due to intrinsic factor deficiency: Secondary | ICD-10-CM

## 2013-12-03 DIAGNOSIS — C880 Waldenstrom macroglobulinemia: Secondary | ICD-10-CM

## 2013-12-03 DIAGNOSIS — D801 Nonfamilial hypogammaglobulinemia: Secondary | ICD-10-CM

## 2013-12-03 DIAGNOSIS — R64 Cachexia: Secondary | ICD-10-CM

## 2013-12-03 LAB — CBC WITH DIFFERENTIAL (CANCER CENTER ONLY)
BASO#: 0.1 10*3/uL (ref 0.0–0.2)
BASO%: 1 % (ref 0.0–2.0)
EOS%: 2 % (ref 0.0–7.0)
Eosinophils Absolute: 0.1 10*3/uL (ref 0.0–0.5)
HEMATOCRIT: 35.6 % — AB (ref 38.7–49.9)
HGB: 11.7 g/dL — ABNORMAL LOW (ref 13.0–17.1)
LYMPH#: 2.4 10*3/uL (ref 0.9–3.3)
LYMPH%: 34.3 % (ref 14.0–48.0)
MCH: 29.3 pg (ref 28.0–33.4)
MCHC: 32.9 g/dL (ref 32.0–35.9)
MCV: 89 fL (ref 82–98)
MONO#: 0.7 10*3/uL (ref 0.1–0.9)
MONO%: 10.3 % (ref 0.0–13.0)
NEUT%: 52.4 % (ref 40.0–80.0)
NEUTROS ABS: 3.7 10*3/uL (ref 1.5–6.5)
Platelets: 249 10*3/uL (ref 145–400)
RBC: 4 10*6/uL — ABNORMAL LOW (ref 4.20–5.70)
RDW: 15.4 % (ref 11.1–15.7)
WBC: 7.1 10*3/uL (ref 4.0–10.0)

## 2013-12-03 LAB — CMP (CANCER CENTER ONLY)
ALT(SGPT): 12 U/L (ref 10–47)
AST: 17 U/L (ref 11–38)
Albumin: 2.5 g/dL — ABNORMAL LOW (ref 3.3–5.5)
Alkaline Phosphatase: 53 U/L (ref 26–84)
BILIRUBIN TOTAL: 0.4 mg/dL (ref 0.20–1.60)
BUN: 13 mg/dL (ref 7–22)
CO2: 26 mEq/L (ref 18–33)
CREATININE: 0.8 mg/dL (ref 0.6–1.2)
Calcium: 8.9 mg/dL (ref 8.0–10.3)
Chloride: 97 mEq/L — ABNORMAL LOW (ref 98–108)
GLUCOSE: 100 mg/dL (ref 73–118)
Potassium: 3.5 mEq/L (ref 3.3–4.7)
Sodium: 136 mEq/L (ref 128–145)
Total Protein: 7.3 g/dL (ref 6.4–8.1)

## 2013-12-03 LAB — IRON AND TIBC CHCC
%SAT: 34 % (ref 20–55)
Iron: 76 ug/dL (ref 42–163)
TIBC: 226 ug/dL (ref 202–409)
UIBC: 150 ug/dL (ref 117–376)

## 2013-12-03 LAB — FERRITIN CHCC: Ferritin: 478 ng/ml — ABNORMAL HIGH (ref 22–316)

## 2013-12-03 MED ORDER — MEGESTROL ACETATE 400 MG/10ML PO SUSP: ORAL | Status: DC

## 2013-12-03 MED ORDER — ACETAMINOPHEN 325 MG PO TABS
ORAL_TABLET | ORAL | Status: AC
  Filled 2013-12-03: qty 2

## 2013-12-03 MED ORDER — IMMUNE GLOBULIN (HUMAN) 20 GM/200ML IV SOLN
0.6000 g/kg | Freq: Once | INTRAVENOUS | Status: AC
  Administered 2013-12-03: 40 g via INTRAVENOUS
  Filled 2013-12-03: qty 400

## 2013-12-03 MED ORDER — CYANOCOBALAMIN 1000 MCG/ML IJ SOLN
1000.0000 ug | Freq: Once | INTRAMUSCULAR | Status: AC
  Administered 2013-12-03: 1000 ug via INTRAMUSCULAR

## 2013-12-03 MED ORDER — ACETAMINOPHEN 325 MG PO TABS
650.0000 mg | ORAL_TABLET | Freq: Once | ORAL | Status: AC
  Administered 2013-12-03: 650 mg via ORAL

## 2013-12-03 MED ORDER — CYANOCOBALAMIN 1000 MCG/ML IJ SOLN
INTRAMUSCULAR | Status: AC
  Filled 2013-12-03: qty 1

## 2013-12-03 MED ORDER — DIPHENHYDRAMINE HCL 25 MG PO CAPS
ORAL_CAPSULE | ORAL | Status: AC
  Filled 2013-12-03: qty 1

## 2013-12-03 MED ORDER — DIPHENHYDRAMINE HCL 25 MG PO CAPS
25.0000 mg | ORAL_CAPSULE | Freq: Once | ORAL | Status: AC
  Administered 2013-12-03: 25 mg via ORAL

## 2013-12-03 MED ORDER — IMMUNE GLOBULIN (HUMAN) 10 GM/200ML IV SOLN: 40.0000 g | Freq: Once | INTRAVENOUS | Status: DC

## 2013-12-03 NOTE — Progress Notes (Signed)
Hematology and Oncology Follow Up Visit  [name] [name] [id number] [birth year] 73 y.o. 12/03/2013   Principle Diagnosis:   Lymphoplasmacytic lymphoma-recurrent  Hypogammaglobulinemia with recurrent pneumonia  Alcohol induced neuropathy and dementia       Vitamin B12 deficiency secondary to malabsorption  Current Therapy:    Imbruvica 420mg  po q day  IVIG 40 gm IV q 6weeks  B12 1mg  IM q 6 wk     Interim History:  [name] [name] is now on medicine for the recurrent lymphoplasmacytic lymphoma. We last saw him, his monoclonal spike was up to 1.85 g/L. His IgM level was up to 3150 mg/dL. His kappa light chain was up to 60.3 mg/dL. Because of this, so that we had to begin treatment for the lymphoma. I went ahead and got him on Imbruvica. He's on 420 mg a day. He started this last week. He's doing well so far.  We did order some Megace for him. Somehow he has not done this. I will only give him another prescription.  Is not falling. He's gained a little weight.  Is not hurting too much. His Ultram.  Overall, his performance status is ECOG 2-3  Medications: Current outpatient prescriptions:fish oil-omega-3 fatty acids 1000 MG capsule, Take 1 g by mouth daily. , Disp: , Rfl: ;  ibrutinib (IMBRUVICA) 140 MG capsul, Take 3 capsules (420 mg total) by mouth daily., Disp: 90 capsule, Rfl: 3;  Pyridoxine HCl (VITAMIN B-6) 250 MG tablet, Take 1 tablet (250 mg total) by mouth daily., Disp: 30 tablet, Rfl: 12 traMADol (ULTRAM) 50 MG tablet, Take 1 tablet (50 mg total) by mouth every 6 (six) hours as needed., Disp: 90 tablet, Rfl: 3;  megestrol (MEGACE) 400 MG/10ML suspension, Take 1 tablespoon a day for appetite and weight gain, Disp: 480 mL, Rfl: 4 No current facility-administered medications for this visit. Facility-Administered Medications Ordered in Other Visits: cyanocobalamin ((VITAMIN B-12)) injection 1,000 mcg, 1,000 mcg, Intramuscular, Once, Volanda Napoleon, MD  Allergies: No Known  Allergies  Past Medical History, Surgical history, Social history, and Family History were reviewed and updated.  Review of Systems: As above  Physical Exam:  height is 5\' 8"  (1.727 m) and weight is 139 lb (63.05 kg). His oral temperature is 97.6 F (36.4 C). His blood pressure is 95/58 and his pulse is 92. His respiration is 16.   Thin white gentleman.Marland Kitchen Head and neck exam shows no ocular or oral lesions. There is no adenopathy in the neck. Lungs are clear. Cardiac exam regular in rhythm. There are no murmurs. Abdomen is soft. Has good bowel sounds. There is no fluid wave. There is no palpable liver or spleen tip. Back exam no tenderness over the spine ribs or hips. Extremities shows no clubbing cyanosis or edema. He has age related arthritic changes. He has 4+/5 strength in his extremities. Skin exam shows scattered ecchymoses. Neurological exam shows no focal deficits.  Lab Results  Component Value Date   WBC 7.1 12/03/2013   HGB 11.7* 12/03/2013   HCT 35.6* 12/03/2013   MCV 89 12/03/2013   PLT 249 12/03/2013     Chemistry      Component Value Date/Time   NA 136 12/03/2013 0912   NA 135 08/06/2013 0851   K 3.5 12/03/2013 0912   K 4.3 08/06/2013 0851   CL 97* 12/03/2013 0912   CL 96 08/06/2013 0851   CO2 26 12/03/2013 0912   CO2 25 08/06/2013 0851   BUN 13 12/03/2013 0912  BUN 8 08/06/2013 0851   CREATININE 0.8 12/03/2013 0912   CREATININE 0.70 08/06/2013 0851      Component Value Date/Time   CALCIUM 8.9 12/03/2013 0912   CALCIUM 8.8 08/06/2013 0851   ALKPHOS 53 12/03/2013 0912   ALKPHOS 67 08/06/2013 0851   AST 17 12/03/2013 0912   AST 12 08/06/2013 0851   ALT 12 12/03/2013 0912   ALT 9 08/06/2013 0851   BILITOT 0.40 12/03/2013 0912   BILITOT 0.3 08/06/2013 0851         Impression and Plan: [name] [name] is a 74yo male with recurrent lymphoplasmacytic lymphoma.  He is now on [place].  It is still too early to know if this is working.  I am grateful that he gained some weight. We will see  him back in another month or so.     Volanda Napoleon, MD 8/21/201512:07 PM

## 2013-12-03 NOTE — Patient Instructions (Signed)
Cyanocobalamin, Vitamin B12 injection What is this medicine? CYANOCOBALAMIN (sye an oh koe BAL a min) is a man made form of vitamin B12. Vitamin B12 is used in the growth of healthy blood cells, nerve cells, and proteins in the body. It also helps with the metabolism of fats and carbohydrates. This medicine is used to treat people who can not absorb vitamin B12. This medicine may be used for other purposes; ask your health care provider or pharmacist if you have questions. COMMON BRAND NAME(S): Cyomin, LA-12, Nutri-Twelve, Primabalt What should I tell my health care provider before I take this medicine? They need to know if you have any of these conditions: -kidney disease -Leber's disease -megaloblastic anemia -an unusual or allergic reaction to cyanocobalamin, cobalt, other medicines, foods, dyes, or preservatives -pregnant or trying to get pregnant -breast-feeding How should I use this medicine? This medicine is injected into a muscle or deeply under the skin. It is usually given by a health care professional in a clinic or doctor's office. However, your doctor may teach you how to inject yourself. Follow all instructions. Talk to your pediatrician regarding the use of this medicine in children. Special care may be needed. Overdosage: If you think you have taken too much of this medicine contact a poison control center or emergency room at once. NOTE: This medicine is only for you. Do not share this medicine with others. What if I miss a dose? If you are given your dose at a clinic or doctor's office, call to reschedule your appointment. If you give your own injections and you miss a dose, take it as soon as you can. If it is almost time for your next dose, take only that dose. Do not take double or extra doses. What may interact with this medicine? -colchicine -heavy alcohol intake This list may not describe all possible interactions. Give your health care provider a list of all the  medicines, herbs, non-prescription drugs, or dietary supplements you use. Also tell them if you smoke, drink alcohol, or use illegal drugs. Some items may interact with your medicine. What should I watch for while using this medicine? Visit your doctor or health care professional regularly. You may need blood work done while you are taking this medicine. You may need to follow a special diet. Talk to your doctor. Limit your alcohol intake and avoid smoking to get the best benefit. What side effects may I notice from receiving this medicine? Side effects that you should report to your doctor or health care professional as soon as possible: -allergic reactions like skin rash, itching or hives, swelling of the face, lips, or tongue -blue tint to skin -chest tightness, pain -difficulty breathing, wheezing -dizziness -red, swollen painful area on the leg Side effects that usually do not require medical attention (report to your doctor or health care professional if they continue or are bothersome): -diarrhea -headache This list may not describe all possible side effects. Call your doctor for medical advice about side effects. You may report side effects to FDA at 1-800-FDA-1088. Where should I keep my medicine? Keep out of the reach of children. Store at room temperature between 15 and 30 degrees C (59 and 85 degrees F). Protect from light. Throw away any unused medicine after the expiration date. NOTE: This sheet is a summary. It may not cover all possible information. If you have questions about this medicine, talk to your doctor, pharmacist, or health care provider.  2015, Elsevier/Gold Standard. (2007-07-13 22:10:20) Immune  Globulin Injection What is this medicine? IMMUNE GLOBULIN (im MUNE GLOB yoo lin) helps to prevent or reduce the severity of certain infections in patients who are at risk. This medicine is collected from the pooled blood of many donors. It is used to treat immune system  problems, thrombocytopenia, and Kawasaki syndrome. This medicine may be used for other purposes; ask your health care provider or pharmacist if you have questions. COMMON BRAND NAME(S): Baygam, BIVIGAM, Carimune, Carimune NF, Flebogamma, Flebogamma DIF, GamaSTAN S/D, Gamimune N, Gammagard S/D, Gammaked, Gammaplex, Gammar-P IV, Gamunex, Gamunex-C, Hizentra, Iveegam, Iveegam EN, Octagam, Panglobulin, Panglobulin NF, Polygam S/D, Privigen, Sandoglobulin, Venoglobulin-S, Vigam, Vivaglobulin What should I tell my health care provider before I take this medicine? They need to know if you have any of these conditions: - diabetes - extremely low or no immune antibodies in the blood - heart disease - history of blood clots - hyperprolinemia - infection in the blood, sepsis - kidney disease - taking medicine that may change kidney function - ask your health care provider about your medicine - an unusual or allergic reaction to human immune globulin, albumin, maltose, sucrose, polysorbate 80, other medicines, foods, dyes, or preservatives - pregnant or trying to get pregnant - breast-feeding How should I use this medicine? This medicine is for injection into a muscle or infusion into a vein or skin. It is usually given by a health care professional in a hospital or clinic setting. In rare cases, some brands of this medicine might be given at home. You will be taught how to give this medicine. Use exactly as directed. Take your medicine at regular intervals. Do not take your medicine more often than directed. Talk to your pediatrician regarding the use of this medicine in children. Special care may be needed. Overdosage: If you think you have taken too much of this medicine contact a poison control center or emergency room at once. NOTE: This medicine is only for you. Do not share this medicine with others. What if I miss a dose? It is important not to miss your dose. Call your doctor or health  care professional if you are unable to keep an appointment. If you give yourself the medicine and you miss a dose, take it as soon as you can. If it is almost time for your next dose, take only that dose. Do not take double or extra doses. What may interact with this medicine? -aspirin and aspirin-like medicines -cisplatin -cyclosporine -medicines for infection like acyclovir, adefovir, amphotericin B, bacitracin, cidofovir, foscarnet, ganciclovir, gentamicin, pentamidine, vancomycin -NSAIDS, medicines for pain and inflammation, like ibuprofen or naproxen -pamidronate -vaccines -zoledronic acid This list may not describe all possible interactions. Give your health care provider a list of all the medicines, herbs, non-prescription drugs, or dietary supplements you use. Also tell them if you smoke, drink alcohol, or use illegal drugs. Some items may interact with your medicine. What should I watch for while using this medicine? Your condition will be monitored carefully while you are receiving this medicine. This medicine is made from pooled blood donations of many different people. It may be possible to pass an infection in this medicine. However, the donors are screened for infections and all products are tested for HIV and hepatitis. The medicine is treated to kill most or all bacteria and viruses. Talk to your doctor about the risks and benefits of this medicine. Do not have vaccinations for at least 14 days before, or until at least 3 months after receiving this  medicine. What side effects may I notice from receiving this medicine? Side effects that you should report to your doctor or health care professional as soon as possible: -allergic reactions like skin rash, itching or hives, swelling of the face, lips, or tongue -breathing problems -chest pain or tightness -fever, chills -headache with nausea, vomiting -neck pain or difficulty moving neck -pain when moving eyes -pain, swelling,  warmth in the leg -problems with balance, talking, walking -sudden weight gain -swelling of the ankles, feet, hands -trouble passing urine or change in the amount of urine Side effects that usually do not require medical attention (report to your doctor or health care professional if they continue or are bothersome): -dizzy, drowsy -flushing -increased sweating -leg cramps -muscle aches and pains -pain at site where injected This list may not describe all possible side effects. Call your doctor for medical advice about side effects. You may report side effects to FDA at 1-800-FDA-1088. Where should I keep my medicine? Keep out of the reach of children. This drug is usually given in a hospital or clinic and will not be stored at home. In rare cases, some brands of this medicine may be given at home. If you are using this medicine at home, you will be instructed on how to store this medicine. Throw away any unused medicine after the expiration date on the label. NOTE: This sheet is a summary. It may not cover all possible information. If you have questions about this medicine, talk to your doctor, pharmacist, or health care provider.  2015, Elsevier/Gold Standard. (2008-06-22 11:44:49)

## 2013-12-07 ENCOUNTER — Telehealth: Payer: Self-pay | Admitting: *Deleted

## 2013-12-07 NOTE — Telephone Encounter (Signed)
Message copied by Rico Ala on Tue Dec 07, 2013 11:05 AM ------      Message from: Volanda Napoleon      Created: Sun Dec 05, 2013  2:15 PM       acall his son!!  Lymphoma is already responding!!  The testosterone level is low!! He needs a dose of testosterone!!  This will help him feel better!!  Set up Depo-testosterone 400mg  IM x 1.  pete ------

## 2013-12-08 ENCOUNTER — Telehealth: Payer: Self-pay | Admitting: Hematology & Oncology

## 2013-12-08 LAB — PROTEIN ELECTROPHORESIS, SERUM, WITH REFLEX
ALBUMIN ELP: 41.4 % — AB (ref 55.8–66.1)
Alpha-1-Globulin: 7.6 % — ABNORMAL HIGH (ref 2.9–4.9)
Alpha-2-Globulin: 12.5 % — ABNORMAL HIGH (ref 7.1–11.8)
BETA GLOBULIN: 6.5 % (ref 4.7–7.2)
Beta 2: 23 % — ABNORMAL HIGH (ref 3.2–6.5)
Gamma Globulin: 9 % — ABNORMAL LOW (ref 11.1–18.8)
M-Spike, %: 1.34 g/dL
TOTAL PROTEIN, SERUM ELECTROPHOR: 6.4 g/dL (ref 6.0–8.3)

## 2013-12-08 LAB — TESTOSTERONE: Testosterone: 182 ng/dL — ABNORMAL LOW (ref 300–890)

## 2013-12-08 LAB — IGG, IGA, IGM
IGA: 10 mg/dL — AB (ref 68–379)
IGG (IMMUNOGLOBIN G), SERUM: 680 mg/dL (ref 650–1600)
IgM, Serum: 2060 mg/dL — ABNORMAL HIGH (ref 41–251)

## 2013-12-08 LAB — KAPPA/LAMBDA LIGHT CHAINS
KAPPA FREE LGHT CHN: 27.3 mg/dL — AB (ref 0.33–1.94)
Kappa:Lambda Ratio: 39 — ABNORMAL HIGH (ref 0.26–1.65)
Lambda Free Lght Chn: 0.7 mg/dL (ref 0.57–2.63)

## 2013-12-08 LAB — IFE INTERPRETATION

## 2013-12-08 LAB — LACTATE DEHYDROGENASE: LDH: 64 U/L — ABNORMAL LOW (ref 94–250)

## 2013-12-08 NOTE — Telephone Encounter (Signed)
Left son message to call and schedule appointment for pt.

## 2013-12-17 ENCOUNTER — Ambulatory Visit (HOSPITAL_BASED_OUTPATIENT_CLINIC_OR_DEPARTMENT_OTHER): Payer: Medicare Other

## 2013-12-17 VITALS — BP 102/62 | HR 92 | Temp 97.8°F | Resp 20

## 2013-12-17 DIAGNOSIS — D6481 Anemia due to antineoplastic chemotherapy: Secondary | ICD-10-CM

## 2013-12-17 DIAGNOSIS — D510 Vitamin B12 deficiency anemia due to intrinsic factor deficiency: Secondary | ICD-10-CM

## 2013-12-17 DIAGNOSIS — T451X5A Adverse effect of antineoplastic and immunosuppressive drugs, initial encounter: Principal | ICD-10-CM

## 2013-12-17 DIAGNOSIS — K909 Intestinal malabsorption, unspecified: Secondary | ICD-10-CM

## 2013-12-17 MED ORDER — IMMUNE GLOBULIN (HUMAN) 10 GM/200ML IV SOLN: 40.0000 g | Freq: Once | INTRAVENOUS | Status: DC

## 2013-12-17 MED ORDER — CYANOCOBALAMIN 1000 MCG/ML IJ SOLN
INTRAMUSCULAR | Status: AC
  Filled 2013-12-17: qty 1

## 2013-12-17 MED ORDER — CYANOCOBALAMIN 1000 MCG/ML IJ SOLN
1000.0000 ug | Freq: Once | INTRAMUSCULAR | Status: AC
  Administered 2013-12-17: 1000 ug via INTRAMUSCULAR

## 2013-12-17 NOTE — Patient Instructions (Signed)

## 2013-12-23 ENCOUNTER — Encounter: Payer: Self-pay | Admitting: *Deleted

## 2013-12-23 NOTE — Progress Notes (Signed)
Received a fax from [facility] stating the patient's prescription was shipped on 12/22/13.

## 2014-01-03 ENCOUNTER — Ambulatory Visit (HOSPITAL_BASED_OUTPATIENT_CLINIC_OR_DEPARTMENT_OTHER): Payer: Medicare Other

## 2014-01-03 ENCOUNTER — Other Ambulatory Visit (HOSPITAL_BASED_OUTPATIENT_CLINIC_OR_DEPARTMENT_OTHER): Payer: Medicare Other | Admitting: Lab

## 2014-01-03 ENCOUNTER — Other Ambulatory Visit: Payer: Self-pay | Admitting: *Deleted

## 2014-01-03 ENCOUNTER — Ambulatory Visit (HOSPITAL_BASED_OUTPATIENT_CLINIC_OR_DEPARTMENT_OTHER): Payer: Medicare Other | Admitting: Family

## 2014-01-03 VITALS — BP 107/58 | HR 70 | Temp 98.1°F | Resp 20 | Ht 68.0 in | Wt 147.0 lb

## 2014-01-03 DIAGNOSIS — C880 Waldenstrom macroglobulinemia: Secondary | ICD-10-CM

## 2014-01-03 DIAGNOSIS — C8589 Other specified types of non-Hodgkin lymphoma, extranodal and solid organ sites: Secondary | ICD-10-CM

## 2014-01-03 DIAGNOSIS — E291 Testicular hypofunction: Secondary | ICD-10-CM

## 2014-01-03 DIAGNOSIS — R7989 Other specified abnormal findings of blood chemistry: Secondary | ICD-10-CM | POA: Insufficient documentation

## 2014-01-03 LAB — CBC WITH DIFFERENTIAL (CANCER CENTER ONLY)
BASO#: 0 10*3/uL (ref 0.0–0.2)
BASO%: 0.3 % (ref 0.0–2.0)
EOS ABS: 0 10*3/uL (ref 0.0–0.5)
EOS%: 0.1 % (ref 0.0–7.0)
HCT: 40.9 % (ref 38.7–49.9)
HGB: 13.8 g/dL (ref 13.0–17.1)
LYMPH#: 0.6 10*3/uL — ABNORMAL LOW (ref 0.9–3.3)
LYMPH%: 6.1 % — AB (ref 14.0–48.0)
MCH: 31.7 pg (ref 28.0–33.4)
MCHC: 33.7 g/dL (ref 32.0–35.9)
MCV: 94 fL (ref 82–98)
MONO#: 2.7 10*3/uL — ABNORMAL HIGH (ref 0.1–0.9)
MONO%: 27.9 % — ABNORMAL HIGH (ref 0.0–13.0)
NEUT%: 65.6 % (ref 40.0–80.0)
NEUTROS ABS: 6.2 10*3/uL (ref 1.5–6.5)
PLATELETS: 145 10*3/uL (ref 145–400)
RBC: 4.36 10*6/uL (ref 4.20–5.70)
RDW: 15.8 % — ABNORMAL HIGH (ref 11.1–15.7)
WBC: 9.5 10*3/uL (ref 4.0–10.0)

## 2014-01-03 LAB — CMP (CANCER CENTER ONLY)
ALBUMIN: 2.8 g/dL — AB (ref 3.3–5.5)
ALT(SGPT): 17 U/L (ref 10–47)
AST: 17 U/L (ref 11–38)
Alkaline Phosphatase: 57 U/L (ref 26–84)
BUN, Bld: 14 mg/dL (ref 7–22)
CO2: 25 mEq/L (ref 18–33)
Calcium: 8.9 mg/dL (ref 8.0–10.3)
Chloride: 97 mEq/L — ABNORMAL LOW (ref 98–108)
Creat: 0.7 mg/dl (ref 0.6–1.2)
GLUCOSE: 110 mg/dL (ref 73–118)
POTASSIUM: 3.3 meq/L (ref 3.3–4.7)
SODIUM: 133 meq/L (ref 128–145)
TOTAL PROTEIN: 7.2 g/dL (ref 6.4–8.1)
Total Bilirubin: 0.9 mg/dl (ref 0.20–1.60)

## 2014-01-03 LAB — TESTOSTERONE: Testosterone: 94 ng/dL — ABNORMAL LOW (ref 300–890)

## 2014-01-03 LAB — TECHNOLOGIST REVIEW CHCC SATELLITE

## 2014-01-03 MED ORDER — TESTOSTERONE CYPIONATE 200 MG/ML IM SOLN
400.0000 mg | Freq: Once | INTRAMUSCULAR | Status: AC
  Administered 2014-01-03: 400 mg via INTRAMUSCULAR

## 2014-01-03 MED ORDER — TESTOSTERONE CYPIONATE 200 MG/ML IM SOLN
INTRAMUSCULAR | Status: AC
  Filled 2014-01-03: qty 2

## 2014-01-03 NOTE — Progress Notes (Signed)
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 His son's feel that his testosterone injection has worn off and that his level is low again. We will check this. He is currently on medication for the recurrent lymphoplasmacytic lymphoma. In August his monoclonal spike was up to 1.34 g/L IgM level was up to 2060 mg/dL kappa light chain was 27.30 mg/dL. He's doing well so far. He denies fever, chills, n/v, cough, rash, headache, dizziness, SOB, chest pain, palpitations, abdominal pain, constipation, diarrhea, blood in urine or stool. He has no swelling, tenderness, numbness or tingling his extremities. He states that his legs started aching on Friday. He has had trouble walking since yesterday. His sons' state that after his testosterone shot he was very energetic and now it seems to have tapered off. His appetite is a bit better and he is trying to drink plenty of fluids.   CURRENT TREATMENT: Imbruvica 420mg  po q day  IVIG 40 gm IV q 6weeks  B12 1mg  IM q 6 wk  REVIEW OF SYSTEMS: All other 10 point review of systems is negative except for those issues mentioned above.   PAST MEDICAL HISTORY: Past Medical History  Diagnosis Date  . Lymphadenopathy   . Hyperlipidemia   . COPD (chronic obstructive pulmonary disease)   . Chills   . Low grade fever   . Unintentional weight loss   . Nasal congestion   . Hearing loss   . Weakness   . Substance abuse     beer consumption - 6 per day.  . Cancer     lymphoma  .  Waldenstrom macroglobulinemia 07/10/2011  . Pernicious anemia 05/05/2013   PAST SURGICAL HISTORY: Past Surgical History  Procedure Laterality Date  . Back surgery  2000   FAMILY HISTORY Family History  Problem Relation Age of Onset  . Cancer Mother     stomach  . Stroke Father    GYNECOLOGIC HISTORY:  No LMP for male patient.   SOCIAL HISTORY:  History   Social History  . Marital Status: Married    Spouse Name: N/A    Number of Children: N/A  . Years of Education: N/A   Occupational History  . Not on file.   Social History Main Topics  . Smoking status: Former Smoker -- 2.00 packs/day for 40 years    Types: Cigarettes    Start date: 06/26/1950    Quit date: 07/04/2001  . Smokeless tobacco: Never Used     Comment: quit 12 yeras ago  . Alcohol Use: Yes     Comment: 6 beers per day.  . Drug Use: No  . Sexual Activity: No   Other Topics Concern  . Not on file   Social History Narrative  . No narrative on file   ADVANCED DIRECTIVES: <no information>  HEALTH MAINTENANCE: History  Substance Use Topics  . Smoking status: Former Smoker -- 2.00 packs/day for 40 years    Types: Cigarettes    Start date: 06/26/1950    Quit date: 07/04/2001  . Smokeless tobacco: Never Used     Comment: quit 12 yeras ago  .  Alcohol Use: Yes     Comment: 6 beers per day.   Colonoscopy: PAP: Bone density: Lipid panel:  No Known Allergies  Current Outpatient Prescriptions  Medication Sig Dispense Refill  . fish oil-omega-3 fatty acids 1000 MG capsule Take 1 g by mouth daily.       Marland Kitchen ibrutinib (IMBRUVICA) 140 MG capsul Take 3 capsules (420 mg total) by mouth daily.  90 capsule  3  . megestrol (MEGACE) 400 MG/10ML suspension Take 1 tablespoon a day for appetite and weight gain  480 mL  4  . Pyridoxine HCl (VITAMIN B-6) 250 MG tablet Take 1 tablet (250 mg total) by mouth daily.  30 tablet  12  . traMADol (ULTRAM) 50 MG tablet Take 1 tablet (50 mg total) by mouth every 6 (six)  hours as needed.  90 tablet  3   Current Facility-Administered Medications  Medication Dose Route Frequency Provider Last Rate Last Dose  . testosterone cypionate (DEPOTESTOTERONE CYPIONATE) injection 400 mg  400 mg Intramuscular Once Eliezer Bottom, NP       OBJECTIVE: Filed Vitals:   01/03/14 1400  BP: 107/58  Pulse: 70  Temp: 98.1 F (36.7 C)  Resp: 20   Body mass index is 22.36 kg/(m^2). ECOG FS:0 - Asymptomatic Ocular: Sclerae unicteric, pupils equal, round and reactive to light Ear-nose-throat: Oropharynx clear, dentition fair Lymphatic: No cervical or supraclavicular adenopathy Lungs no rales or rhonchi, good excursion bilaterally Heart regular rate and rhythm, no murmur appreciated Abd soft, nontender, positive bowel sounds MSK no focal spinal tenderness, no joint edema Neuro: non-focal, well-oriented, appropriate affect  LAB RESULTS: CMP     Component Value Date/Time   NA 133 01/03/2014 1328   NA 135 08/06/2013 0851   K 3.3 01/03/2014 1328   K 4.3 08/06/2013 0851   CL 97* 01/03/2014 1328   CL 96 08/06/2013 0851   CO2 25 01/03/2014 1328   CO2 25 08/06/2013 0851   GLUCOSE 110 01/03/2014 1328   GLUCOSE 151* 08/06/2013 0851   BUN 14 01/03/2014 1328   BUN 8 08/06/2013 0851   CREATININE 0.7 01/03/2014 1328   CREATININE 0.70 08/06/2013 0851   CALCIUM 8.9 01/03/2014 1328   CALCIUM 8.8 08/06/2013 0851   PROT 7.2 01/03/2014 1328   PROT 7.0 08/06/2013 0851   ALBUMIN 3.0* 08/06/2013 0851   AST 17 01/03/2014 1328   AST 12 08/06/2013 0851   ALT 17 01/03/2014 1328   ALT 9 08/06/2013 0851   ALKPHOS 57 01/03/2014 1328   ALKPHOS 67 08/06/2013 0851   BILITOT 0.90 01/03/2014 1328   BILITOT 0.3 08/06/2013 0851   GFRNONAA >90 04/27/2013 0433   GFRAA >90 04/27/2013 0433   No results found for this basename: SPEP, UPEP,  kappa and lambda light chains   Lab Results  Component Value Date   WBC 9.5 01/03/2014   NEUTROABS 6.2 01/03/2014   HGB 13.8 01/03/2014   HCT 40.9 01/03/2014   MCV 94 01/03/2014    PLT 145 01/03/2014   No results found for this basename: LABCA2   No components found with this basename: RDEYC144   No results found for this basename: INR,  in the last 168 hours  STUDIES: No results found.  ASSESSMENT/PLAN: [name] [name] is a 73yo male with recurrent lymphoplasmacytic lymphoma. He was recently started on Imbruvica. He is having aching in his legs.  His CBC and CMP are good. We are checking his testosterone level.  After speaking with Dr. Marin Olp, we will give  him a testosterone injection today.  He already has an appointment with Dr. Marin Olp next week. We will keep that appointment and see how he does.  He knows to call here with any questions or concerns and to go to the ED in the event of an emergency. We can certainly see him sooner if need be.    Eliezer Bottom, NP 01/03/2014 3:40 PM

## 2014-01-03 NOTE — Patient Instructions (Signed)

## 2014-01-10 ENCOUNTER — Ambulatory Visit (HOSPITAL_BASED_OUTPATIENT_CLINIC_OR_DEPARTMENT_OTHER): Payer: Medicare Other | Admitting: Lab

## 2014-01-10 ENCOUNTER — Encounter: Payer: Self-pay | Admitting: Hematology & Oncology

## 2014-01-10 ENCOUNTER — Ambulatory Visit (HOSPITAL_BASED_OUTPATIENT_CLINIC_OR_DEPARTMENT_OTHER): Payer: Medicare Other | Admitting: Hematology & Oncology

## 2014-01-10 ENCOUNTER — Ambulatory Visit (HOSPITAL_BASED_OUTPATIENT_CLINIC_OR_DEPARTMENT_OTHER): Payer: Medicare Other

## 2014-01-10 ENCOUNTER — Ambulatory Visit (HOSPITAL_BASED_OUTPATIENT_CLINIC_OR_DEPARTMENT_OTHER)
Admission: RE | Admit: 2014-01-10 | Discharge: 2014-01-10 | Disposition: A | Discharge status: Home / Self Care | Payer: Medicare Other | Source: Ambulatory Visit | Attending: Hematology & Oncology | Admitting: Hematology & Oncology

## 2014-01-10 VITALS — BP 108/63 | HR 117 | Temp 97.8°F | Resp 18 | Ht 68.0 in | Wt 145.0 lb

## 2014-01-10 VITALS — BP 108/60 | HR 92 | Temp 97.5°F | Resp 20

## 2014-01-10 DIAGNOSIS — R059 Cough, unspecified: Secondary | ICD-10-CM | POA: Insufficient documentation

## 2014-01-10 DIAGNOSIS — T451X5A Adverse effect of antineoplastic and immunosuppressive drugs, initial encounter: Principal | ICD-10-CM

## 2014-01-10 DIAGNOSIS — C8589 Other specified types of non-Hodgkin lymphoma, extranodal and solid organ sites: Secondary | ICD-10-CM

## 2014-01-10 DIAGNOSIS — F329 Major depressive disorder, single episode, unspecified: Secondary | ICD-10-CM

## 2014-01-10 DIAGNOSIS — E538 Deficiency of other specified B group vitamins: Secondary | ICD-10-CM

## 2014-01-10 DIAGNOSIS — J449 Chronic obstructive pulmonary disease, unspecified: Secondary | ICD-10-CM | POA: Diagnosis not present

## 2014-01-10 DIAGNOSIS — D510 Vitamin B12 deficiency anemia due to intrinsic factor deficiency: Secondary | ICD-10-CM

## 2014-01-10 DIAGNOSIS — D6481 Anemia due to antineoplastic chemotherapy: Secondary | ICD-10-CM

## 2014-01-10 DIAGNOSIS — E349 Endocrine disorder, unspecified: Secondary | ICD-10-CM

## 2014-01-10 DIAGNOSIS — C8800 Waldenstrom macroglobulinemia not having achieved remission: Secondary | ICD-10-CM

## 2014-01-10 DIAGNOSIS — J4489 Other specified chronic obstructive pulmonary disease: Secondary | ICD-10-CM | POA: Insufficient documentation

## 2014-01-10 DIAGNOSIS — R05 Cough: Secondary | ICD-10-CM

## 2014-01-10 DIAGNOSIS — R64 Cachexia: Secondary | ICD-10-CM

## 2014-01-10 DIAGNOSIS — D509 Iron deficiency anemia, unspecified: Secondary | ICD-10-CM

## 2014-01-10 DIAGNOSIS — E291 Testicular hypofunction: Secondary | ICD-10-CM

## 2014-01-10 DIAGNOSIS — C9112 Chronic lymphocytic leukemia of B-cell type in relapse: Secondary | ICD-10-CM

## 2014-01-10 DIAGNOSIS — D801 Nonfamilial hypogammaglobulinemia: Secondary | ICD-10-CM

## 2014-01-10 DIAGNOSIS — R599 Enlarged lymph nodes, unspecified: Secondary | ICD-10-CM | POA: Insufficient documentation

## 2014-01-10 DIAGNOSIS — F32A Depression, unspecified: Secondary | ICD-10-CM

## 2014-01-10 DIAGNOSIS — R7989 Other specified abnormal findings of blood chemistry: Secondary | ICD-10-CM

## 2014-01-10 LAB — CBC WITH DIFFERENTIAL (CANCER CENTER ONLY)
BASO#: 0 10*3/uL (ref 0.0–0.2)
BASO%: 0.6 % (ref 0.0–2.0)
EOS%: 0.8 % (ref 0.0–7.0)
Eosinophils Absolute: 0.1 10*3/uL (ref 0.0–0.5)
HCT: 38.9 % (ref 38.7–49.9)
HGB: 12.9 g/dL — ABNORMAL LOW (ref 13.0–17.1)
LYMPH#: 1.2 10*3/uL (ref 0.9–3.3)
LYMPH%: 18.4 % (ref 14.0–48.0)
MCH: 30.7 pg (ref 28.0–33.4)
MCHC: 33.2 g/dL (ref 32.0–35.9)
MCV: 93 fL (ref 82–98)
MONO#: 1.2 10*3/uL — ABNORMAL HIGH (ref 0.1–0.9)
MONO%: 17.9 % — AB (ref 0.0–13.0)
NEUT%: 62.3 % (ref 40.0–80.0)
NEUTROS ABS: 4 10*3/uL (ref 1.5–6.5)
Platelets: 255 10*3/uL (ref 145–400)
RBC: 4.2 10*6/uL (ref 4.20–5.70)
RDW: 14.9 % (ref 11.1–15.7)
WBC: 6.5 10*3/uL (ref 4.0–10.0)

## 2014-01-10 LAB — CMP (CANCER CENTER ONLY)
ALBUMIN: 2.5 g/dL — AB (ref 3.3–5.5)
ALT(SGPT): 13 U/L (ref 10–47)
AST: 12 U/L (ref 11–38)
Alkaline Phosphatase: 50 U/L (ref 26–84)
BUN, Bld: 8 mg/dL (ref 7–22)
CALCIUM: 8.7 mg/dL (ref 8.0–10.3)
CHLORIDE: 97 meq/L — AB (ref 98–108)
CO2: 27 mEq/L (ref 18–33)
Creat: 0.7 mg/dl (ref 0.6–1.2)
Glucose, Bld: 102 mg/dL (ref 73–118)
POTASSIUM: 3.4 meq/L (ref 3.3–4.7)
SODIUM: 136 meq/L (ref 128–145)
Total Bilirubin: 0.6 mg/dl (ref 0.20–1.60)
Total Protein: 7.5 g/dL (ref 6.4–8.1)

## 2014-01-10 LAB — IRON AND TIBC CHCC
%SAT: 12 % — ABNORMAL LOW (ref 20–55)
Iron: 17 ug/dL — ABNORMAL LOW (ref 42–163)
TIBC: 143 ug/dL — ABNORMAL LOW (ref 202–409)
UIBC: 125 ug/dL (ref 117–376)

## 2014-01-10 LAB — FERRITIN CHCC: Ferritin: 1413 ng/ml — ABNORMAL HIGH (ref 22–316)

## 2014-01-10 MED ORDER — IMMUNE GLOBULIN (HUMAN) 10 GM/100ML IV SOLN
40.0000 g | Freq: Once | INTRAVENOUS | Status: AC
  Administered 2014-01-10: 40 g via INTRAVENOUS
  Filled 2014-01-10: qty 400

## 2014-01-10 MED ORDER — DIPHENHYDRAMINE HCL 25 MG PO CAPS
25.0000 mg | ORAL_CAPSULE | Freq: Once | ORAL | Status: AC
  Administered 2014-01-10: 25 mg via ORAL

## 2014-01-10 MED ORDER — CYANOCOBALAMIN 1000 MCG/ML IJ SOLN
1000.0000 ug | Freq: Once | INTRAMUSCULAR | Status: AC
  Administered 2014-01-10: 1000 ug via INTRAMUSCULAR

## 2014-01-10 MED ORDER — ESCITALOPRAM OXALATE 10 MG PO TABS: 10.0000 mg | ORAL_TABLET | Freq: Every day | ORAL | Status: AC

## 2014-01-10 MED ORDER — TESTOSTERONE CYPIONATE 200 MG/ML IM SOLN
INTRAMUSCULAR | Status: AC
  Filled 2014-01-10: qty 1

## 2014-01-10 MED ORDER — ACETAMINOPHEN 325 MG PO TABS
650.0000 mg | ORAL_TABLET | Freq: Once | ORAL | Status: AC
  Administered 2014-01-10: 650 mg via ORAL

## 2014-01-10 MED ORDER — CYANOCOBALAMIN 1000 MCG/ML IJ SOLN
INTRAMUSCULAR | Status: AC
  Filled 2014-01-10: qty 1

## 2014-01-10 MED ORDER — ACETAMINOPHEN 325 MG PO TABS
ORAL_TABLET | ORAL | Status: AC
  Filled 2014-01-10: qty 2

## 2014-01-10 MED ORDER — DIPHENHYDRAMINE HCL 25 MG PO CAPS
ORAL_CAPSULE | ORAL | Status: AC
  Filled 2014-01-10: qty 1

## 2014-01-10 MED ORDER — SODIUM CHLORIDE 0.9 % IV SOLN
Freq: Once | INTRAVENOUS | Status: AC
  Administered 2014-01-10: 11:00:00 via INTRAVENOUS

## 2014-01-10 MED ORDER — TESTOSTERONE CYPIONATE 200 MG/ML IM SOLN
200.0000 mg | Freq: Once | INTRAMUSCULAR | Status: AC
  Administered 2014-01-10: 200 mg via INTRAMUSCULAR

## 2014-01-10 NOTE — Progress Notes (Signed)
Pt states he stopped taking Imbruvica, Megace, Fish oil, Vit B6, and Ultram last week.

## 2014-01-10 NOTE — Patient Instructions (Signed)
Cyanocobalamin, Vitamin B12 injection What is this medicine? CYANOCOBALAMIN (sye an oh koe BAL a min) is a man made form of vitamin B12. Vitamin B12 is used in the growth of healthy blood cells, nerve cells, and proteins in the body. It also helps with the metabolism of fats and carbohydrates. This medicine is used to treat people who can not absorb vitamin B12. This medicine may be used for other purposes; ask your health care provider or pharmacist if you have questions. COMMON BRAND NAME(S): Cyomin, LA-12, Nutri-Twelve, Primabalt What should I tell my health care provider before I take this medicine? They need to know if you have any of these conditions: -kidney disease -Leber's disease -megaloblastic anemia -an unusual or allergic reaction to cyanocobalamin, cobalt, other medicines, foods, dyes, or preservatives -pregnant or trying to get pregnant -breast-feeding How should I use this medicine? This medicine is injected into a muscle or deeply under the skin. It is usually given by a health care professional in a clinic or doctor's office. However, your doctor may teach you how to inject yourself. Follow all instructions. Talk to your pediatrician regarding the use of this medicine in children. Special care may be needed. Overdosage: If you think you have taken too much of this medicine contact a poison control center or emergency room at once. NOTE: This medicine is only for you. Do not share this medicine with others. What if I miss a dose? If you are given your dose at a clinic or doctor's office, call to reschedule your appointment. If you give your own injections and you miss a dose, take it as soon as you can. If it is almost time for your next dose, take only that dose. Do not take double or extra doses. What may interact with this medicine? -colchicine -heavy alcohol intake This list may not describe all possible interactions. Give your health care provider a list of all the  medicines, herbs, non-prescription drugs, or dietary supplements you use. Also tell them if you smoke, drink alcohol, or use illegal drugs. Some items may interact with your medicine. What should I watch for while using this medicine? Visit your doctor or health care professional regularly. You may need blood work done while you are taking this medicine. You may need to follow a special diet. Talk to your doctor. Limit your alcohol intake and avoid smoking to get the best benefit. What side effects may I notice from receiving this medicine? Side effects that you should report to your doctor or health care professional as soon as possible: -allergic reactions like skin rash, itching or hives, swelling of the face, lips, or tongue -blue tint to skin -chest tightness, pain -difficulty breathing, wheezing -dizziness -red, swollen painful area on the leg Side effects that usually do not require medical attention (report to your doctor or health care professional if they continue or are bothersome): -diarrhea -headache This list may not describe all possible side effects. Call your doctor for medical advice about side effects. You may report side effects to FDA at 1-800-FDA-1088. Where should I keep my medicine? Keep out of the reach of children. Store at room temperature between 15 and 30 degrees C (59 and 85 degrees F). Protect from light. Throw away any unused medicine after the expiration date. NOTE: This sheet is a summary. It may not cover all possible information. If you have questions about this medicine, talk to your doctor, pharmacist, or health care provider.  2015, Elsevier/Gold Standard. (2007-07-13 22:10:20) Testosterone  injection What is this medicine? TESTOSTERONE (tes TOS ter one) is the main male hormone. It supports normal male development such as muscle growth, facial hair, and deep voice. It is used in males to treat low testosterone levels. This medicine may be used for other  purposes; ask your health care provider or pharmacist if you have questions. COMMON BRAND NAME(S): Andro-L.A., Aveed, Delatestryl, Depo-Testosterone, Virilon What should I tell my health care provider before I take this medicine? They need to know if you have any of these conditions: -breast cancer -diabetes -heart disease -kidney disease -liver disease -lung disease -prostate cancer, enlargement -an unusual or allergic reaction to testosterone, other medicines, foods, dyes, or preservatives -pregnant or trying to get pregnant -breast-feeding How should I use this medicine? This medicine is for injection into a muscle. It is usually given by a health care professional in a hospital or clinic setting. Contact your pediatrician regarding the use of this medicine in children. While this medicine may be prescribed for children as young as 45 years of age for selected conditions, precautions do apply. Overdosage: If you think you have taken too much of this medicine contact a poison control center or emergency room at once. NOTE: This medicine is only for you. Do not share this medicine with others. What if I miss a dose? Try not to miss a dose. Your doctor or health care professional will tell you when your next injection is due. Notify the office if you are unable to keep an appointment. What may interact with this medicine? -medicines for diabetes -medicines that treat or prevent blood clots like warfarin -oxyphenbutazone -propranolol -steroid medicines like prednisone or cortisone This list may not describe all possible interactions. Give your health care provider a list of all the medicines, herbs, non-prescription drugs, or dietary supplements you use. Also tell them if you smoke, drink alcohol, or use illegal drugs. Some items may interact with your medicine. What should I watch for while using this medicine? Visit your doctor or health care professional for regular checks on your  progress. They will need to check the level of testosterone in your blood. This medicine is only approved for use in men who have low levels of testosterone related to certain medical conditions. Heart attacks and strokes have been reported with the use of this medicine. Notify your doctor or health care professional and seek emergency treatment if you develop breathing problems; changes in vision; confusion; chest pain or chest tightness; sudden arm pain; severe, sudden headache; trouble speaking or understanding; sudden numbness or weakness of the face, arm or leg; loss of balance or coordination. Talk to your doctor about the risks and benefits of this medicine. This medicine may affect blood sugar levels. If you have diabetes, check with your doctor or health care professional before you change your diet or the dose of your diabetic medicine. This drug is banned from use in athletes by most athletic organizations. What side effects may I notice from receiving this medicine? Side effects that you should report to your doctor or health care professional as soon as possible: -allergic reactions like skin rash, itching or hives, swelling of the face, lips, or tongue -breast enlargement -breathing problems -changes in mood, especially anger, depression, or rage -dark urine -general ill feeling or flu-like symptoms -light-colored stools -loss of appetite, nausea -nausea, vomiting -right upper belly pain -stomach pain -swelling of ankles -too frequent or persistent erections -trouble passing urine or change in the amount of urine -unusually  weak or tired -yellowing of the eyes or skin Additional side effects that can occur in women include: -deep or hoarse voice -facial hair growth -irregular menstrual periods Side effects that usually do not require medical attention (report to your doctor or health care professional if they continue or are bothersome): -acne -change in sex drive or  performance -hair loss -headache This list may not describe all possible side effects. Call your doctor for medical advice about side effects. You may report side effects to FDA at 1-800-FDA-1088. Where should I keep my medicine? Keep out of the reach of children. This medicine can be abused. Keep your medicine in a safe place to protect it from theft. Do not share this medicine with anyone. Selling or giving away this medicine is dangerous and against the law. Store at room temperature between 20 and 25 degrees C (68 and 77 degrees F). Do not freeze. Protect from light. Follow the directions for the product you are prescribed. Throw away any unused medicine after the expiration date. NOTE: This sheet is a summary. It may not cover all possible information. If you have questions about this medicine, talk to your doctor, pharmacist, or health care provider.  2015, Elsevier/Gold Standard. (2013-06-17 16:10:96) Immune Globulin Injection What is this medicine? IMMUNE GLOBULIN (im MUNE GLOB yoo lin) helps to prevent or reduce the severity of certain infections in patients who are at risk. This medicine is collected from the pooled blood of many donors. It is used to treat immune system problems, thrombocytopenia, and Kawasaki syndrome. This medicine may be used for other purposes; ask your health care provider or pharmacist if you have questions. COMMON BRAND NAME(S): Baygam, BIVIGAM, Carimune, Carimune NF, Flebogamma, Flebogamma DIF, GamaSTAN S/D, Gamimune N, Gammagard S/D, Gammaked, Gammaplex, Gammar-P IV, Gamunex, Gamunex-C, Hizentra, Iveegam, Iveegam EN, Octagam, Panglobulin, Panglobulin NF, Polygam S/D, Privigen, Sandoglobulin, Venoglobulin-S, Vigam, Vivaglobulin What should I tell my health care provider before I take this medicine? They need to know if you have any of these conditions: - diabetes - extremely low or no immune antibodies in the blood - heart disease - history of blood  clots - hyperprolinemia - infection in the blood, sepsis - kidney disease - taking medicine that may change kidney function - ask your health care provider about your medicine - an unusual or allergic reaction to human immune globulin, albumin, maltose, sucrose, polysorbate 80, other medicines, foods, dyes, or preservatives - pregnant or trying to get pregnant - breast-feeding How should I use this medicine? This medicine is for injection into a muscle or infusion into a vein or skin. It is usually given by a health care professional in a hospital or clinic setting. In rare cases, some brands of this medicine might be given at home. You will be taught how to give this medicine. Use exactly as directed. Take your medicine at regular intervals. Do not take your medicine more often than directed. Talk to your pediatrician regarding the use of this medicine in children. Special care may be needed. Overdosage: If you think you have taken too much of this medicine contact a poison control center or emergency room at once. NOTE: This medicine is only for you. Do not share this medicine with others. What if I miss a dose? It is important not to miss your dose. Call your doctor or health care professional if you are unable to keep an appointment. If you give yourself the medicine and you miss a dose, take it as soon as  you can. If it is almost time for your next dose, take only that dose. Do not take double or extra doses. What may interact with this medicine? -aspirin and aspirin-like medicines -cisplatin -cyclosporine -medicines for infection like acyclovir, adefovir, amphotericin B, bacitracin, cidofovir, foscarnet, ganciclovir, gentamicin, pentamidine, vancomycin -NSAIDS, medicines for pain and inflammation, like ibuprofen or naproxen -pamidronate -vaccines -zoledronic acid This list may not describe all possible interactions. Give your health care provider a list of all the medicines,  herbs, non-prescription drugs, or dietary supplements you use. Also tell them if you smoke, drink alcohol, or use illegal drugs. Some items may interact with your medicine. What should I watch for while using this medicine? Your condition will be monitored carefully while you are receiving this medicine. This medicine is made from pooled blood donations of many different people. It may be possible to pass an infection in this medicine. However, the donors are screened for infections and all products are tested for HIV and hepatitis. The medicine is treated to kill most or all bacteria and viruses. Talk to your doctor about the risks and benefits of this medicine. Do not have vaccinations for at least 14 days before, or until at least 3 months after receiving this medicine. What side effects may I notice from receiving this medicine? Side effects that you should report to your doctor or health care professional as soon as possible: -allergic reactions like skin rash, itching or hives, swelling of the face, lips, or tongue -breathing problems -chest pain or tightness -fever, chills -headache with nausea, vomiting -neck pain or difficulty moving neck -pain when moving eyes -pain, swelling, warmth in the leg -problems with balance, talking, walking -sudden weight gain -swelling of the ankles, feet, hands -trouble passing urine or change in the amount of urine Side effects that usually do not require medical attention (report to your doctor or health care professional if they continue or are bothersome): -dizzy, drowsy -flushing -increased sweating -leg cramps -muscle aches and pains -pain at site where injected This list may not describe all possible side effects. Call your doctor for medical advice about side effects. You may report side effects to FDA at 1-800-FDA-1088. Where should I keep my medicine? Keep out of the reach of children. This drug is usually given in a hospital or clinic  and will not be stored at home. In rare cases, some brands of this medicine may be given at home. If you are using this medicine at home, you will be instructed on how to store this medicine. Throw away any unused medicine after the expiration date on the label. NOTE: This sheet is a summary. It may not cover all possible information. If you have questions about this medicine, talk to your doctor, pharmacist, or health care provider.  2015, Elsevier/Gold Standard. (2008-06-22 11:44:49)

## 2014-01-11 ENCOUNTER — Telehealth: Payer: Self-pay | Admitting: *Deleted

## 2014-01-11 NOTE — Telephone Encounter (Addendum)
Message copied by Lenn Sink on Tue Jan 11, 2014  9:43 AM ------      Message from: Burney Gauze R      Created: Mon Jan 10, 2014  5:36 PM       Call his son:  The iron is low again. He needs Feraheme 1020mg  x 1 dose.  Please set up for this or next week.  pete ------Left voicemail with son informing him that his father needs IV iron this or next week. Informed son to call our office back to make an appt.

## 2014-01-12 ENCOUNTER — Telehealth: Payer: Self-pay | Admitting: *Deleted

## 2014-01-12 ENCOUNTER — Encounter: Payer: Self-pay | Admitting: *Deleted

## 2014-01-12 DIAGNOSIS — D510 Vitamin B12 deficiency anemia due to intrinsic factor deficiency: Secondary | ICD-10-CM

## 2014-01-12 LAB — RETICULOCYTES (CHCC)
ABS Retic: 25.2 10*3/uL (ref 19.0–186.0)
RBC.: 4.2 MIL/uL — AB (ref 4.22–5.81)
Retic Ct Pct: 0.6 % (ref 0.4–2.3)

## 2014-01-12 LAB — IGG, IGA, IGM
IGA: 9 mg/dL — AB (ref 68–379)
IgG (Immunoglobin G), Serum: 616 mg/dL — ABNORMAL LOW (ref 650–1600)
IgM, Serum: 2660 mg/dL — ABNORMAL HIGH (ref 41–251)

## 2014-01-12 LAB — PROTEIN ELECTROPHORESIS, SERUM, WITH REFLEX
ALBUMIN ELP: 33.4 % — AB (ref 55.8–66.1)
Alpha-1-Globulin: 14.4 % — ABNORMAL HIGH (ref 2.9–4.9)
Alpha-2-Globulin: 14 % — ABNORMAL HIGH (ref 7.1–11.8)
Beta 2: 25.1 % — ABNORMAL HIGH (ref 3.2–6.5)
Beta Globulin: 5.6 % (ref 4.7–7.2)
Gamma Globulin: 7.5 % — ABNORMAL LOW (ref 11.1–18.8)
M-Spike, %: 1.57 g/dL
Total Protein, Serum Electrophoresis: 6.6 g/dL (ref 6.0–8.3)

## 2014-01-12 LAB — LACTATE DEHYDROGENASE: LDH: 64 U/L — ABNORMAL LOW (ref 94–250)

## 2014-01-12 LAB — KAPPA/LAMBDA LIGHT CHAINS
Kappa free light chain: 76.4 mg/dL — ABNORMAL HIGH (ref 0.33–1.94)
Kappa:Lambda Ratio: 955 — ABNORMAL HIGH (ref 0.26–1.65)
Lambda Free Lght Chn: 0.08 mg/dL — ABNORMAL LOW (ref 0.57–2.63)

## 2014-01-12 LAB — IFE INTERPRETATION

## 2014-01-12 NOTE — Progress Notes (Signed)
Hematology and Oncology Follow Up Visit  ARLYN BUERKLE [id number] Jun 15, 1940 73 y.o. 01/12/2014   Principle Diagnosis:   Lymphoplasmacytic lymphoma-recurrent  Hypogammaglobulinemia with recurrent pneumonia  Alcohol induced neuropathy and dementia       Vitamin B12 deficiency secondary to malabsorption  Current Therapy:    Imbruvica 420mg  po q day  IVIG 40 gm IV q 6weeks  B12 1mg  IM q 6 wk     Interim History:  Mr.  Flanigan is back for followup. He's had some time over the past week or so. He was in last week. Gave him a dose of testosterone. This makes him feel better.  He stopped taking the Imburvica. He thought this was making him feel weak. I told him that the patient's who have been taking this really have not had a problem.  Am I sure as to what is going on with him.  His was not taking much medicine at all right now.  His last protein studies showed a monoclonal spike of a 1.34 g/dL. His IgM level was 2060 mg/dL. His kappa light chain was 27.3 mg/dL.  He is had no fever. He's had no cough.  We did do a chest x-ray on him today. His shows some COPD changes and some pulmonary fibrosis. There's no pneumonia or heart failure. Medications: Current outpatient prescriptions:escitalopram (LEXAPRO) 10 MG tablet, Take 1 tablet (10 mg total) by mouth daily., Disp: 30 tablet, Rfl: 6;  fish oil-omega-3 fatty acids 1000 MG capsule, Take 1 g by mouth daily. , Disp: , Rfl: ;  ibrutinib (IMBRUVICA) 140 MG capsul, Take 3 capsules (420 mg total) by mouth daily., Disp: 90 capsule, Rfl: 3 megestrol (MEGACE) 400 MG/10ML suspension, Take 1 tablespoon a day for appetite and weight gain, Disp: 480 mL, Rfl: 4;  Pyridoxine HCl (VITAMIN B-6) 250 MG tablet, Take 1 tablet (250 mg total) by mouth daily., Disp: 30 tablet, Rfl: 12;  traMADol (ULTRAM) 50 MG tablet, Take 1 tablet (50 mg total) by mouth every 6 (six) hours as needed., Disp: 90 tablet, Rfl: 3  Allergies: No Known Allergies  Past Medical  History, Surgical history, Social history, and Family History were reviewed and updated.  Review of Systems: As above  Physical Exam:  height is 5\' 8"  (1.727 m) and weight is 145 lb (65.772 kg). His oral temperature is 97.8 F (36.6 C). His blood pressure is 108/63 and his pulse is 117. His respiration is 18.   Elderly, thin white cell and. Head and exam shows no ocular or oral lesions. He has no palpable cervical or supraclavicular lymph nodes. Lungs are clear. His Xarelto, wheezes or rhonchi. Cardiac exam regular in rhythm with no murmurs, rubs or bruits. Abdomen is soft. Has good bowel sounds. There is no fluid wave. There is no palpable liver or spleen tip. Back exam shows no tenderness over the spine, ribs or hips. Extremities shows no clubbing, cyanosis or edema. Skin exam shows some scattered ecchymoses. Neurological exam shows no focal neurological deficits.  Lab Results  Component Value Date   WBC 6.5 01/10/2014   HGB 12.9* 01/10/2014   HCT 38.9 01/10/2014   MCV 93 01/10/2014   PLT 255 01/10/2014     Chemistry      Component Value Date/Time   NA 136 01/10/2014 0925   NA 135 08/06/2013 0851   K 3.4 01/10/2014 0925   K 4.3 08/06/2013 0851   CL 97* 01/10/2014 0925   CL 96 08/06/2013 0851   CO2 27  01/10/2014 0925   CO2 25 08/06/2013 0851   BUN 8 01/10/2014 0925   BUN 8 08/06/2013 0851   CREATININE 0.7 01/10/2014 0925   CREATININE 0.70 08/06/2013 0851      Component Value Date/Time   CALCIUM 8.7 01/10/2014 0925   CALCIUM 8.8 08/06/2013 0851   ALKPHOS 50 01/10/2014 0925   ALKPHOS 67 08/06/2013 0851   AST 12 01/10/2014 0925   AST 12 08/06/2013 0851   ALT 13 01/10/2014 0925   ALT 9 08/06/2013 0851   BILITOT 0.60 01/10/2014 0925   BILITOT 0.3 08/06/2013 0851      IgM level is 2660 mg/dL. His kappa light chain is 76.4 mg/dL.  Ferritin is 1400. Iron saturation is only 12. Total iron is only 17.   Impression and Plan: Mr. Lazarus is 73 year old done. Has recurrent lymphoplasmacytic lymphoma.  His levels are drawn up. Again, he stopped the Imburvica.  We will have to get him back on this. Maybe if we just gave him 2 pills a day this might help.  His iron was also low. His elevated ferritin, I believe is an acute phase reactant.  Upon to get him back in here in another 3 weeks.  We will go ahead with the IVIG today.  I spent a good 30 minutes with him today.   Volanda Napoleon, MD 9/30/20157:37 AM

## 2014-01-12 NOTE — Telephone Encounter (Addendum)
Message copied by Lenn Sink on Wed Jan 12, 2014  2:10 PM ------      Message from: Burney Gauze R      Created: Wed Jan 12, 2014  7:31 AM       Call his son:  The proteins levels are actually going back up!!  Need to get back on Imbruvica --Take 2 plls a day!!  Pete ------Spoke with son and informed him that the pt needs to take only 2 Imbruvica pills a day; son verbalized understanding. Informed son that Dr Marin Olp wants the pt to receive an iron transfusion soon. Son asked if pt is okay to get this infusion while he is in the office on October 13th. Dr Marin Olp agreed that pt could get his IV iron on October 13th.

## 2014-01-24 ENCOUNTER — Encounter: Payer: Self-pay | Admitting: Gastroenterology

## 2014-01-25 ENCOUNTER — Ambulatory Visit (HOSPITAL_BASED_OUTPATIENT_CLINIC_OR_DEPARTMENT_OTHER): Payer: Medicare Other | Admitting: Hematology & Oncology

## 2014-01-25 ENCOUNTER — Ambulatory Visit (HOSPITAL_BASED_OUTPATIENT_CLINIC_OR_DEPARTMENT_OTHER): Payer: Medicare Other

## 2014-01-25 ENCOUNTER — Encounter: Payer: Self-pay | Admitting: Hematology & Oncology

## 2014-01-25 ENCOUNTER — Ambulatory Visit: Payer: Medicare Other

## 2014-01-25 ENCOUNTER — Ambulatory Visit (HOSPITAL_BASED_OUTPATIENT_CLINIC_OR_DEPARTMENT_OTHER): Payer: Medicare Other | Admitting: Lab

## 2014-01-25 ENCOUNTER — Other Ambulatory Visit: Payer: Medicare Other | Admitting: Lab

## 2014-01-25 ENCOUNTER — Ambulatory Visit: Payer: Medicare Other | Admitting: Hematology & Oncology

## 2014-01-25 VITALS — BP 114/71 | HR 82 | Temp 97.2°F | Resp 20 | Ht 68.0 in | Wt 140.0 lb

## 2014-01-25 DIAGNOSIS — D510 Vitamin B12 deficiency anemia due to intrinsic factor deficiency: Secondary | ICD-10-CM | POA: Diagnosis not present

## 2014-01-25 DIAGNOSIS — Z23 Encounter for immunization: Secondary | ICD-10-CM

## 2014-01-25 DIAGNOSIS — C8800 Waldenstrom macroglobulinemia not having achieved remission: Secondary | ICD-10-CM

## 2014-01-25 DIAGNOSIS — R7989 Other specified abnormal findings of blood chemistry: Secondary | ICD-10-CM

## 2014-01-25 DIAGNOSIS — T451X5A Adverse effect of antineoplastic and immunosuppressive drugs, initial encounter: Secondary | ICD-10-CM

## 2014-01-25 DIAGNOSIS — R05 Cough: Secondary | ICD-10-CM

## 2014-01-25 DIAGNOSIS — F329 Major depressive disorder, single episode, unspecified: Secondary | ICD-10-CM

## 2014-01-25 DIAGNOSIS — D509 Iron deficiency anemia, unspecified: Secondary | ICD-10-CM

## 2014-01-25 DIAGNOSIS — E349 Endocrine disorder, unspecified: Secondary | ICD-10-CM

## 2014-01-25 DIAGNOSIS — D6481 Anemia due to antineoplastic chemotherapy: Secondary | ICD-10-CM

## 2014-01-25 DIAGNOSIS — F32A Depression, unspecified: Secondary | ICD-10-CM

## 2014-01-25 DIAGNOSIS — E291 Testicular hypofunction: Secondary | ICD-10-CM | POA: Diagnosis not present

## 2014-01-25 DIAGNOSIS — R059 Cough, unspecified: Secondary | ICD-10-CM

## 2014-01-25 DIAGNOSIS — C8300 Small cell B-cell lymphoma, unspecified site: Secondary | ICD-10-CM

## 2014-01-25 LAB — CBC WITH DIFFERENTIAL (CANCER CENTER ONLY)
BASO#: 0.1 10*3/uL (ref 0.0–0.2)
BASO%: 0.6 % (ref 0.0–2.0)
EOS%: 0.7 % (ref 0.0–7.0)
Eosinophils Absolute: 0.1 10*3/uL (ref 0.0–0.5)
HEMATOCRIT: 40.5 % (ref 38.7–49.9)
HEMOGLOBIN: 13.4 g/dL (ref 13.0–17.1)
LYMPH#: 2.6 10*3/uL (ref 0.9–3.3)
LYMPH%: 31.4 % (ref 14.0–48.0)
MCH: 30.7 pg (ref 28.0–33.4)
MCHC: 33.1 g/dL (ref 32.0–35.9)
MCV: 93 fL (ref 82–98)
MONO#: 0.7 10*3/uL (ref 0.1–0.9)
MONO%: 8.8 % (ref 0.0–13.0)
NEUT#: 4.8 10*3/uL (ref 1.5–6.5)
NEUT%: 58.5 % (ref 40.0–80.0)
Platelets: 225 10*3/uL (ref 145–400)
RBC: 4.36 10*6/uL (ref 4.20–5.70)
RDW: 14.8 % (ref 11.1–15.7)
WBC: 8.2 10*3/uL (ref 4.0–10.0)

## 2014-01-25 LAB — CMP (CANCER CENTER ONLY)
ALK PHOS: 64 U/L (ref 26–84)
ALT: 11 U/L (ref 10–47)
AST: 14 U/L (ref 11–38)
Albumin: 2.8 g/dL — ABNORMAL LOW (ref 3.3–5.5)
BUN: 9 mg/dL (ref 7–22)
CO2: 27 mEq/L (ref 18–33)
CREATININE: 0.6 mg/dL (ref 0.6–1.2)
Calcium: 9.2 mg/dL (ref 8.0–10.3)
Chloride: 98 mEq/L (ref 98–108)
Glucose, Bld: 95 mg/dL (ref 73–118)
Potassium: 4.1 mEq/L (ref 3.3–4.7)
Sodium: 138 mEq/L (ref 128–145)
Total Bilirubin: 0.6 mg/dl (ref 0.20–1.60)
Total Protein: 8.4 g/dL — ABNORMAL HIGH (ref 6.4–8.1)

## 2014-01-25 MED ORDER — SODIUM CHLORIDE 0.9 % IV SOLN
INTRAVENOUS | Status: DC
  Administered 2014-01-25: 13:00:00 via INTRAVENOUS

## 2014-01-25 MED ORDER — INFLUENZA VAC SPLIT QUAD 0.5 ML IM SUSY
0.5000 mL | PREFILLED_SYRINGE | Freq: Once | INTRAMUSCULAR | Status: AC
  Administered 2014-01-25: 0.5 mL via INTRAMUSCULAR
  Filled 2014-01-25: qty 0.5

## 2014-01-25 MED ORDER — IBRUTINIB 140 MG PO CAPS: 420.0000 mg | ORAL_CAPSULE | Freq: Every day | ORAL | Status: DC

## 2014-01-25 MED ORDER — SODIUM CHLORIDE 0.9 % IV SOLN
1020.0000 mg | Freq: Once | INTRAVENOUS | Status: AC
  Administered 2014-01-25: 1020 mg via INTRAVENOUS
  Filled 2014-01-25: qty 34

## 2014-01-25 MED ORDER — CYANOCOBALAMIN 1000 MCG/ML IJ SOLN
1000.0000 ug | Freq: Once | INTRAMUSCULAR | Status: AC
  Administered 2014-01-25: 1000 ug via INTRAMUSCULAR

## 2014-01-25 MED ORDER — TESTOSTERONE CYPIONATE 200 MG/ML IM SOLN
200.0000 mg | INTRAMUSCULAR | Status: DC
  Administered 2014-01-25: 200 mg via INTRAMUSCULAR

## 2014-01-25 MED ORDER — CYANOCOBALAMIN 1000 MCG/ML IJ SOLN
INTRAMUSCULAR | Status: AC
  Filled 2014-01-25: qty 1

## 2014-01-25 MED ORDER — TESTOSTERONE CYPIONATE 200 MG/ML IM SOLN
INTRAMUSCULAR | Status: AC
  Filled 2014-01-25: qty 1

## 2014-01-25 NOTE — Patient Instructions (Signed)

## 2014-01-26 NOTE — Progress Notes (Signed)
Hematology and Oncology Follow Up Visit  BOSCO PAPARELLA [id number] 05/27/1940 73 y.o. 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  Truex is back for followup. He saw his not doing better. He still has weakness. I still think that this is all from past alcohol use. I don't think this from his recurrent lymphoma.  He's been off the Imbruvica now for over a month.  We did give him some iron a week or so ago. His iron studies were low.  He does get testosterone injections. This makes him feel a little bit better.  His last IgM level was 2660. His monoclonal spike was 1.57 g/L. I am sure that this is up further.  I talked to him at length today. I told that he has to get back on the Imbruvica. This was helping him quite a bit. He stopped it because he thought it was causing his weakness. This weakness is no better, he understands that the Imbruvica was not the cause.  Medications: Current outpatient prescriptions:escitalopram (LEXAPRO) 10 MG tablet, Take 1 tablet (10 mg total) by mouth daily., Disp: 30 tablet, Rfl: 6;  fish oil-omega-3 fatty acids 1000 MG capsule, Take 1 g by mouth daily. , Disp: , Rfl: ;  ibrutinib (IMBRUVICA) 140 MG capsul, Take 3 capsules (420 mg total) by mouth daily., Disp: 90 capsule, Rfl: 3 Pyridoxine HCl (VITAMIN B-6) 250 MG tablet, Take 1 tablet (250 mg total) by mouth daily., Disp: 30 tablet, Rfl: 12;  traMADol (ULTRAM) 50 MG tablet, Take 1 tablet (50 mg total) by mouth every 6 (six) hours as needed., Disp: 90 tablet, Rfl: 3;  megestrol (MEGACE) 400 MG/10ML suspension, Take 1 tablespoon a day for appetite and weight gain, Disp: 480 mL, Rfl: 4 Current facility-administered  medications:0.9 %  sodium chloride infusion, , Intravenous, Continuous, Volanda Napoleon, MD, Last Rate: 10 mL/hr at 01/25/14 1310  Allergies: No Known Allergies  Past Medical History, Surgical history, Social history, and Family History were reviewed and updated.  Review of Systems: As above  Physical Exam:  height is 5\' 8"  (1.727 m) and weight is 140 lb (63.504 kg). His oral temperature is 97.2 F (36.2 C). His blood pressure is 114/71 and his pulse is 82. His respiration is 20.    thin, white gentleman in no obvious distress. Head and exam is no ocular or oral lesions. He has no scleral icterus. There is no mucositis. He has no adenopathy in his neck. His lungs are clear. Her cardiac exam regular rate and rhythm with no murmurs, rubs or bruits. Abdomen is soft. He has good bowel sounds. There is no fluid wave. There is no palpable liver or spleen tip. Extremities shows muscle atrophy in the upper and lower extremities. Back exam shows no tenderness over the spine, ribs or hips. Neurological exam is nonfocal. Skin exam no rashes.  Lab Results  Component Value Date   WBC 8.2 01/25/2014   HGB 13.4 01/25/2014   HCT 40.5 01/25/2014   MCV 93 01/25/2014   PLT 225 01/25/2014     Chemistry      Component Value Date/Time   NA 138 01/25/2014 1014  NA 135 08/06/2013 0851   K 4.1 01/25/2014 1014   K 4.3 08/06/2013 0851   CL 98 01/25/2014 1014   CL 96 08/06/2013 0851   CO2 27 01/25/2014 1014   CO2 25 08/06/2013 0851   BUN 9 01/25/2014 1014   BUN 8 08/06/2013 0851   CREATININE 0.6 01/25/2014 1014   CREATININE 0.70 08/06/2013 0851      Component Value Date/Time   CALCIUM 9.2 01/25/2014 1014   CALCIUM 8.8 08/06/2013 0851   ALKPHOS 64 01/25/2014 1014   ALKPHOS 67 08/06/2013 0851   AST 14 01/25/2014 1014   AST 12 08/06/2013 0851   ALT 11 01/25/2014 1014   ALT 9 08/06/2013 0851   BILITOT 0.60 01/25/2014 1014   BILITOT 0.3 08/06/2013 0851         Impression and Plan: Mr. Marchi is  73 year old with recurrent lymphoplasmacytic lymphoma. His other health issues.  Again, he has to get back on to be improving to. I this will help him. I told him if he will not do this, then IV chemotherapy is our only option. Hopefully, he will do this. I gave him a prescription to myself. He did get his testosterone today. He did get a vitamin B12.  We will have him come back in one month. Then, he will get IVIG.  I spent over 30 minutes with him today.   Volanda Napoleon, MD 10/14/20157:33 AM

## 2014-01-27 LAB — PROTEIN ELECTROPHORESIS, SERUM, WITH REFLEX
ALPHA-2-GLOBULIN: 10.6 % (ref 7.1–11.8)
Albumin ELP: 37.4 % — ABNORMAL LOW (ref 55.8–66.1)
Alpha-1-Globulin: 6.2 % — ABNORMAL HIGH (ref 2.9–4.9)
BETA GLOBULIN: 5.7 % (ref 4.7–7.2)
Beta 2: 30.1 % — ABNORMAL HIGH (ref 3.2–6.5)
Gamma Globulin: 10 % — ABNORMAL LOW (ref 11.1–18.8)
M-Spike, %: 2.19 g/dL
TOTAL PROTEIN, SERUM ELECTROPHOR: 7.7 g/dL (ref 6.0–8.3)

## 2014-01-27 LAB — IFE INTERPRETATION

## 2014-01-27 LAB — IGG, IGA, IGM
IGA: 11 mg/dL — AB (ref 68–379)
IgG (Immunoglobin G), Serum: 812 mg/dL (ref 650–1600)
IgM, Serum: 3260 mg/dL — ABNORMAL HIGH (ref 41–251)

## 2014-01-27 LAB — KAPPA/LAMBDA LIGHT CHAINS
Kappa free light chain: 47.6 mg/dL — ABNORMAL HIGH (ref 0.33–1.94)
Kappa:Lambda Ratio: 85 — ABNORMAL HIGH (ref 0.26–1.65)
Lambda Free Lght Chn: 0.56 mg/dL — ABNORMAL LOW (ref 0.57–2.63)

## 2014-01-27 LAB — TESTOSTERONE: Testosterone: 656 ng/dL (ref 300–890)

## 2014-02-23 ENCOUNTER — Ambulatory Visit: Payer: Medicare Other

## 2014-02-23 ENCOUNTER — Other Ambulatory Visit: Payer: Medicare Other | Admitting: Lab

## 2014-02-23 ENCOUNTER — Ambulatory Visit: Payer: Medicare Other | Admitting: Hematology & Oncology

## 2014-02-23 IMAGING — CT CT CHEST W/ CM
2 of 4 series · 15 of 46 positions shown, 17 images · IV contrast (OMNIPAQUE)
Comparison: 02/05/2012

CLINICAL DATA: Non-Hodgkin's lymphoma, for restaging

EXAM:
CT CHEST, ABDOMEN, AND PELVIS WITH CONTRAST
TECHNIQUE: Multidetector CT imaging of the chest, abdomen and pelvis was
performed following the standard protocol during bolus
administration of intravenous contrast.
CONTRAST:  100mL OMNIPAQUE IOHEXOL 300 MG/ML  SOLN

[Series 2: rtn a/p with · axial · 0.74mm/px · z∈[-399,+176]mm · 12 of 127 slices shown, 14 images]
[im 6/127  soft-tissue]
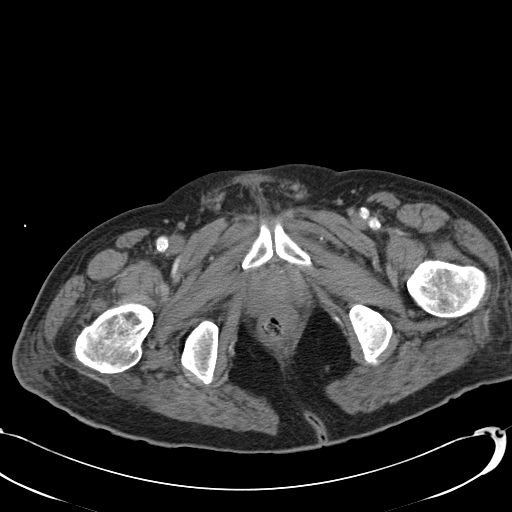
[im 6/127  bone]
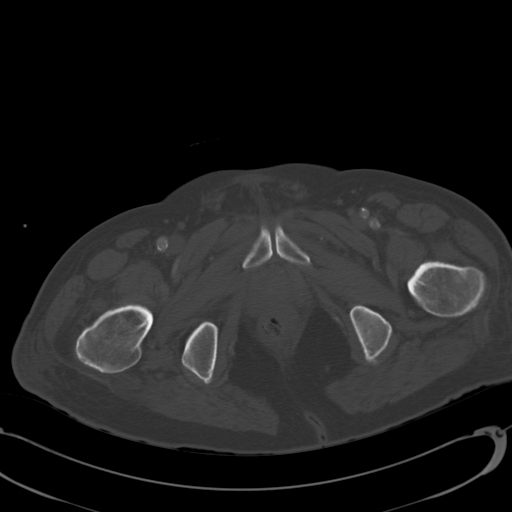
[im 16/127  soft-tissue]
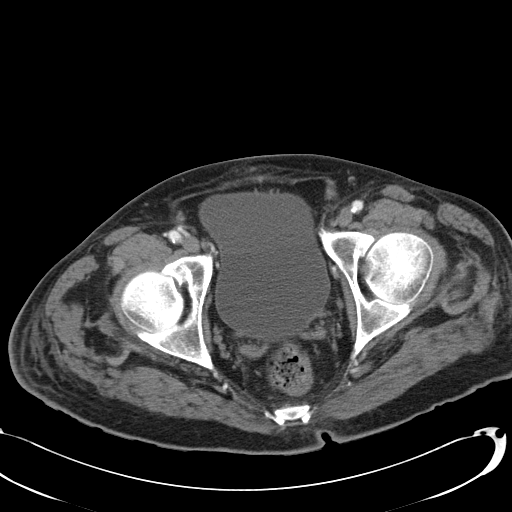
[im 27/127  soft-tissue]
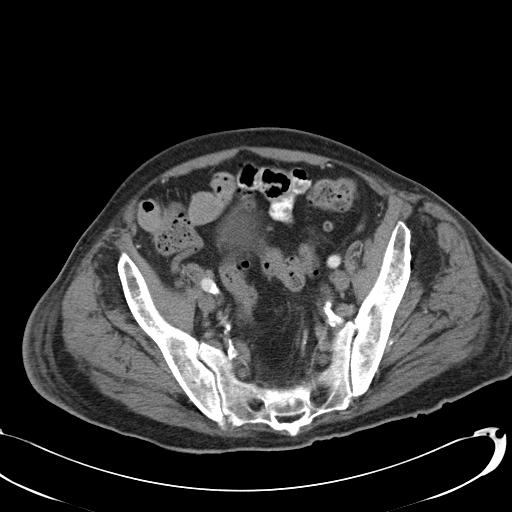
[im 37/127  soft-tissue]
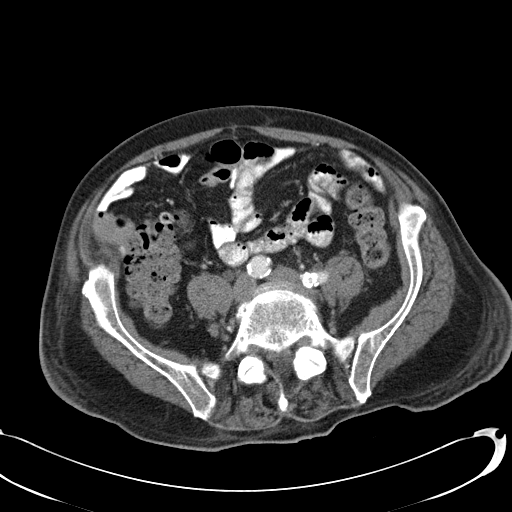
[im 48/127  soft-tissue]
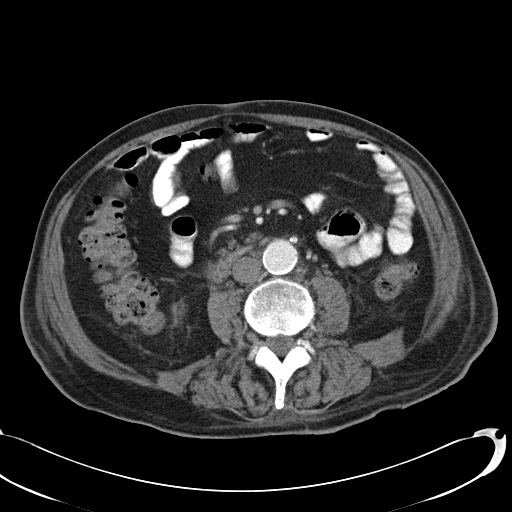
[im 58/127  soft-tissue]
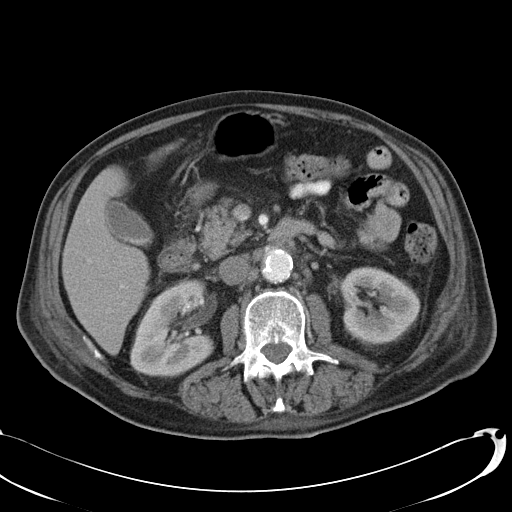
[im 69/127  soft-tissue]
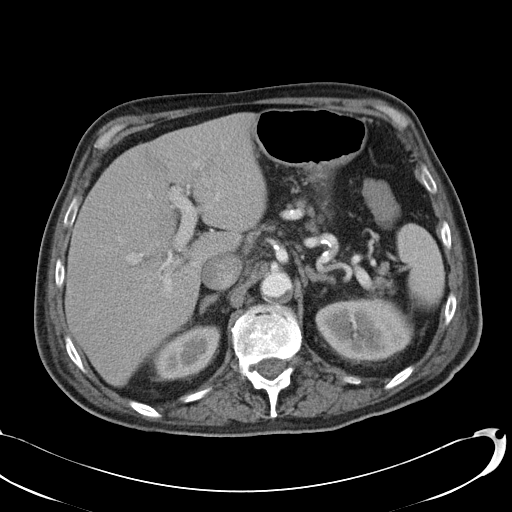
[im 79/127  soft-tissue]
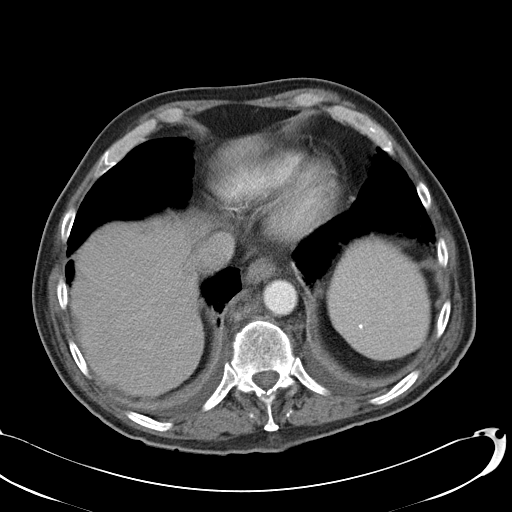
[im 90/127  soft-tissue]
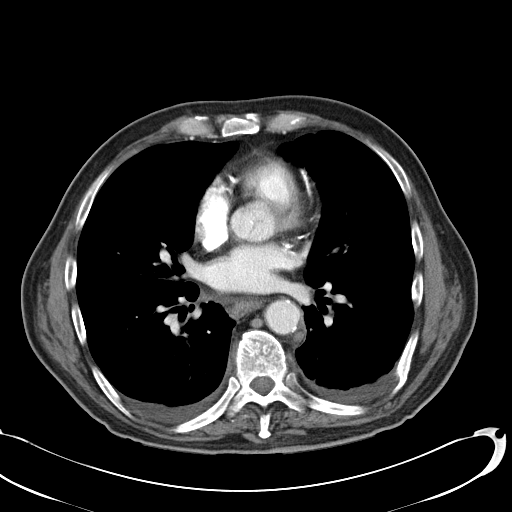
[im 90/127  bone]
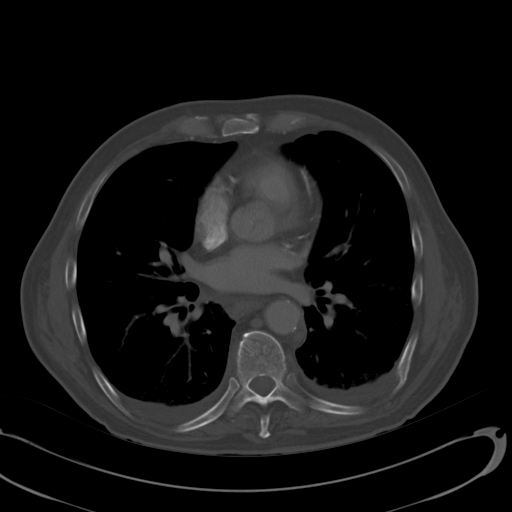
[im 100/127  soft-tissue]
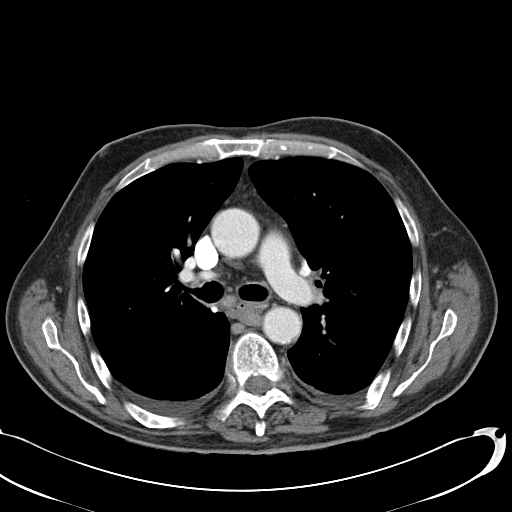
[im 111/127  soft-tissue]
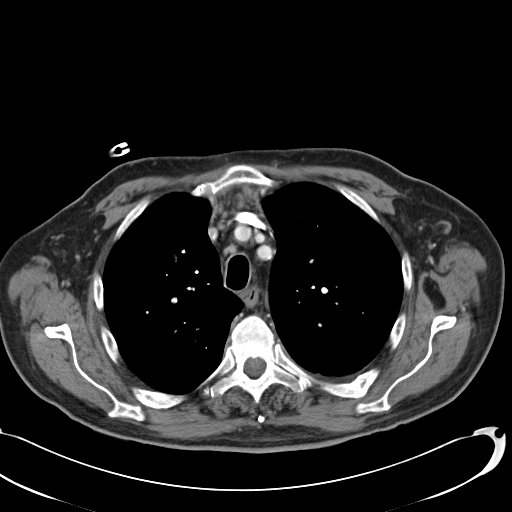
[im 121/127  soft-tissue]
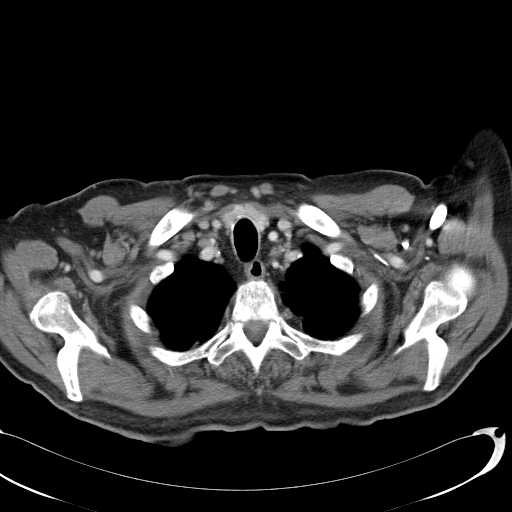

[Series 602: <mpr thick range> · coronal · 1.24mm/px · 3 of 104 slices shown]
[im 35/104  soft-tissue]
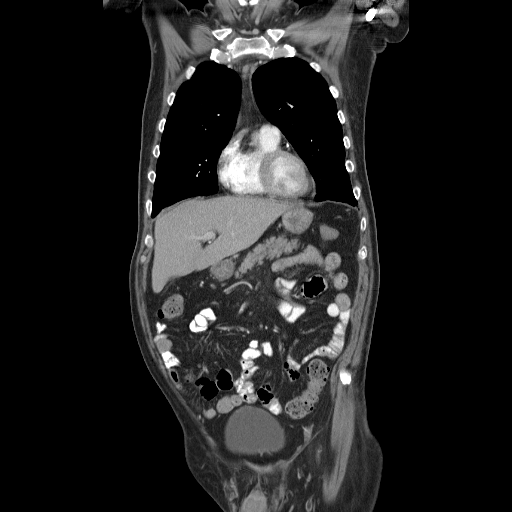
[im 46/104  soft-tissue]
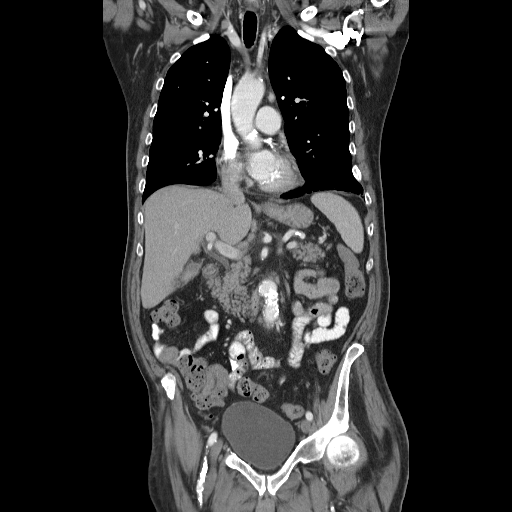
[im 58/104  soft-tissue]
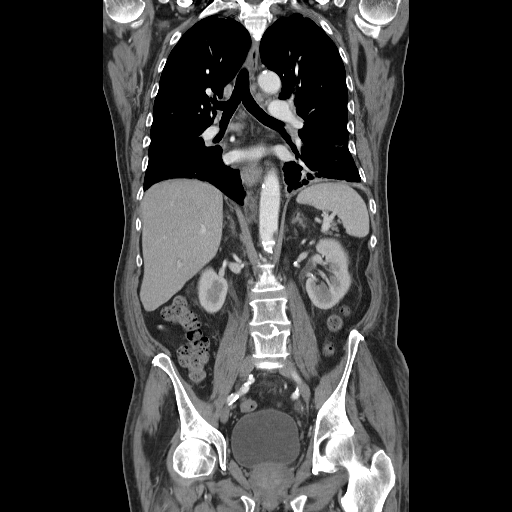

[15 of 46 positions shown; findings below may reference images not displayed]

FINDINGS: CT CHEST FINDINGS

Scattered peribronchovascular micro and macronodularity in the
lingula, posterior right upper lobe, and bilateral lower lobes. For
example:

--8 mm subpleural nodule in the superior segment left lower lobe
(series 5/image 26)

--6 mm nodule in the lingula (series 5/image 27)

--8 mm posterior left lower lobe nodule (series 5/image 29)

--9 mm subpleural right lower lobe nodule along the right major
fissure (series 5/image 31)

Associated dependent lower lobe opacity bilateral (series 5/ image
45). Small bilateral pleural effusions. These findings may have been
present although to a much lesser extent on prior 3995 CT. Given the
posterior/dependent distribution, this appearance is favored to be
the sequela of recurrent/chronic aspiration.

Stable calcified pleural plaque at the right lung base (series
5/image 45). No pneumothorax.

Visualized thyroid is unremarkable.

Heart is normal in size. No pericardial effusion. Coronary
atherosclerosis. Atherosclerotic calcifications of the aortic arch.

Mildly prominent thoracic lymph nodes, including:

--9 mm short axis prevascular node (series 2/image 22), previously 7
mm

--11 mm short axis posterior paraesophageal node (series 2/image
27), previously 12 mm

--13 mm short axis precarinal node (series 2/image 31), previously 8
mm

--11 mm short axis right retrocrural node (series 2/ image 52),
previously 13 mm.

CT ABDOMEN AND PELVIS FINDINGS

Liver, pancreas, and adrenal glands are within normal limits.

Spleen is normal in size.  Splenic granulomata.

Suspected layering tiny gallstone (series 2/ image 70), although
without associated inflammatory changes. No intrahepatic or
extrahepatic ductal dilatation.

Kidneys are within normal limits. Left extrarenal pelvis with mild
urothelial thickened (series 2/ image 67), nonspecific. Bilateral
renal vascular calcifications. No hydronephrosis.

No evidence of bowel obstruction.  Normal appendix.

Atherosclerotic calcifications of the abdominal aorta and branch
vessels. Fusiform ectasia of the infrarenal abdominal aorta
measuring up to 2.9 x 2.8 cm (series 2/image 73).

Scattered small retroperitoneal/para-aortic nodes measuring up to 9
mm short axis (series 2/ image 71), unchanged.

Prostate is grossly unremarkable.

Bladder is within normal limits.

Mild degenerative changes of the lumbar spine, most prominent at
L5-S1.
IMPRESSION: Borderline enlarged mediastinal, retrocrural, and retroperitoneal
lymph nodes, as described above. Overall, this appearance is grossly
unchanged from 3995.

Peribronchovascular nodularity, predominantly in the
posterior/dependent bilateral lower lobes, with associated bibasilar
opacities and small bilateral pleural effusions. These findings have
progressed from 3995 and are favored to be related to sequela of
recurrent/chronic aspiration.

Associated scattered dominant pulmonary nodules, measuring up to 9
mm, as described above. Given the history of lymphoma, consider
follow-up CT chest in 3-6 months to assess for progression. Please
note that [HOSPITAL] guidelines do not apply in the setting
of known malignancy.

## 2014-02-23 IMAGING — CT CT BIOPSY
2 of 3 series · 4 of 10 positions shown, 7 images · non-contrast
Comparison: none

INDICATION: History known lymphoma, please obtain bone marrow aspiration and
biopsy

[Series 4: add scan 5.0 b70f · axial · 0.74mm/px · z∈[-95,-90]mm · 2 of 4 slices shown, 5 images (1 of 2)]
[im 2/4  soft-tissue]
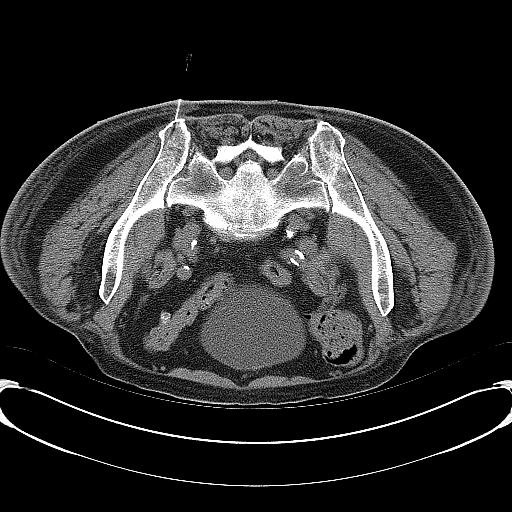
[im 2/4  lung]
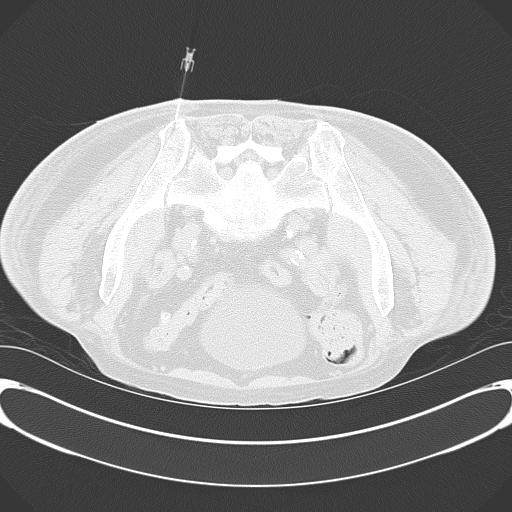
[im 2/4  bone]
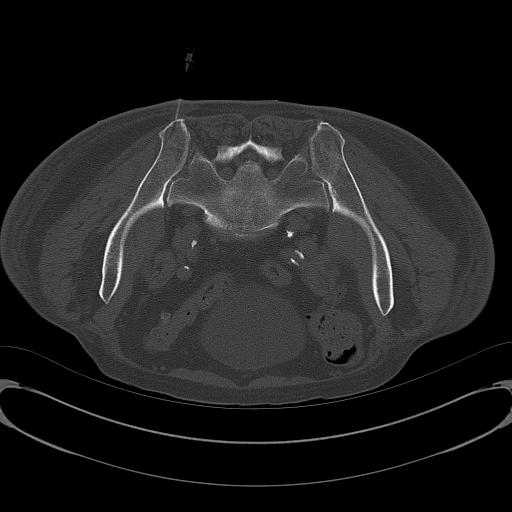
[im 3/4  soft-tissue]
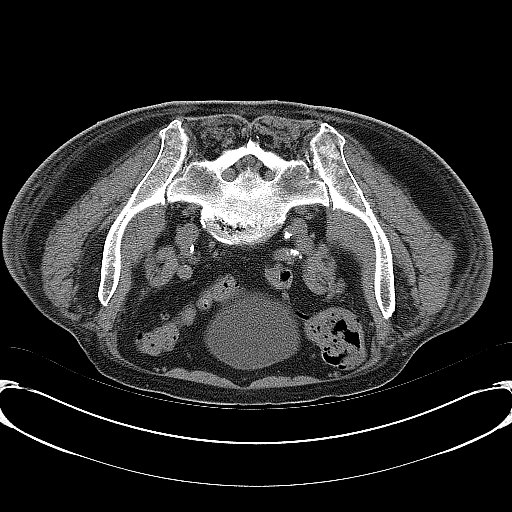
[im 3/4  lung]
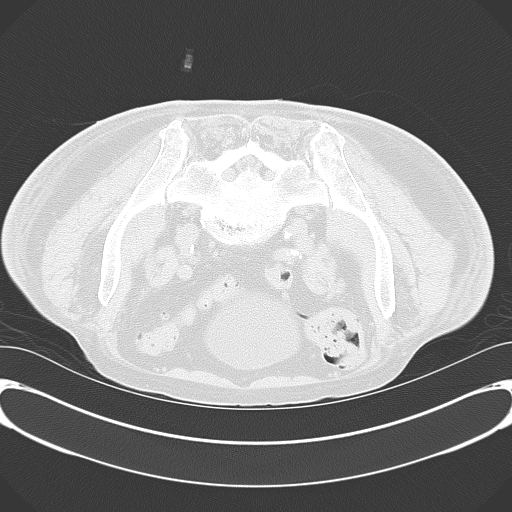

[Series 5: add scan 5.0 b70f · axial · 0.74mm/px · z∈[-95,-90]mm · 2 of 4 slices shown (2 of 2)]
[im 2/4  soft-tissue]
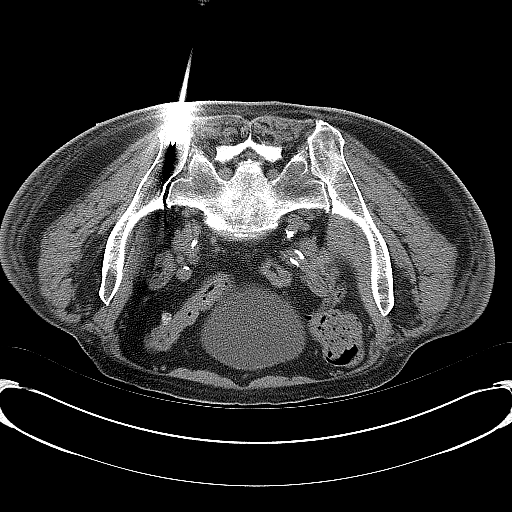
[im 3/4  soft-tissue]
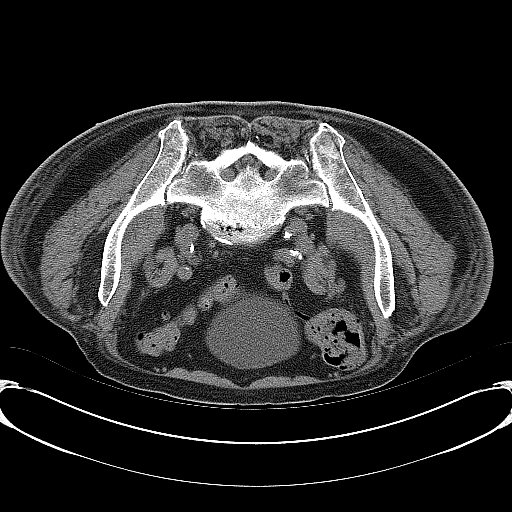

[4 of 10 positions shown; findings below may reference images not displayed]

EXAM:
CT BIOPSY

MEDICATIONS:
Fentanyl 25 mcg IV; Versed 2 mg IV

ANESTHESIA/SEDATION:
Sedation Time

10 minutes

CONTRAST:  None

PROCEDURE:
Informed consent was obtained from the patient following an
explanation of the procedure, risks, benefits and alternatives. The
patient understands, agrees and consents for the procedure. All
questions were addressed. A time out was performed prior to the
initiation of the procedure. The patient was positioned prone and
noncontrast localization CT was performed of the pelvis to
demonstrate the iliac marrow spaces. The operative site was prepped
and draped in the usual sterile fashion.

Under sterile conditions and local anesthesia, an 11 gauge coaxial
bone biopsy needle was advanced into the left iliac marrow space.
Needle position was confirmed with CT imaging. Initially, bone
marrow aspiration was performed. Next, a bone marrow biopsy was
obtained with the 11 gauge outer bone marrow device. Samples were
prepared with the cytotechnologist and deemed adequate. The needle
was removed intact. Hemostasis was obtained with compression and a
dressing was placed. The patient tolerated the procedure well
without immediate post procedural complication.

COMPLICATIONS:
None immediate.
IMPRESSION: Successful CT guided left iliac bone marrow aspiration and core
biopsies.

## 2014-02-25 ENCOUNTER — Ambulatory Visit (HOSPITAL_BASED_OUTPATIENT_CLINIC_OR_DEPARTMENT_OTHER): Payer: Medicare Other | Admitting: Lab

## 2014-02-25 ENCOUNTER — Ambulatory Visit (HOSPITAL_BASED_OUTPATIENT_CLINIC_OR_DEPARTMENT_OTHER): Payer: Medicare Other

## 2014-02-25 ENCOUNTER — Ambulatory Visit (HOSPITAL_BASED_OUTPATIENT_CLINIC_OR_DEPARTMENT_OTHER): Payer: Medicare Other | Admitting: Hematology & Oncology

## 2014-02-25 VITALS — BP 116/67 | HR 98 | Temp 97.8°F | Resp 18 | Ht 68.0 in | Wt 145.0 lb

## 2014-02-25 DIAGNOSIS — C8300 Small cell B-cell lymphoma, unspecified site: Secondary | ICD-10-CM

## 2014-02-25 DIAGNOSIS — C8800 Waldenstrom macroglobulinemia not having achieved remission: Secondary | ICD-10-CM

## 2014-02-25 DIAGNOSIS — E291 Testicular hypofunction: Secondary | ICD-10-CM

## 2014-02-25 DIAGNOSIS — T451X5A Adverse effect of antineoplastic and immunosuppressive drugs, initial encounter: Principal | ICD-10-CM

## 2014-02-25 DIAGNOSIS — D6481 Anemia due to antineoplastic chemotherapy: Secondary | ICD-10-CM

## 2014-02-25 DIAGNOSIS — D510 Vitamin B12 deficiency anemia due to intrinsic factor deficiency: Secondary | ICD-10-CM

## 2014-02-25 DIAGNOSIS — R7989 Other specified abnormal findings of blood chemistry: Secondary | ICD-10-CM

## 2014-02-25 DIAGNOSIS — D801 Nonfamilial hypogammaglobulinemia: Secondary | ICD-10-CM

## 2014-02-25 DIAGNOSIS — E538 Deficiency of other specified B group vitamins: Secondary | ICD-10-CM

## 2014-02-25 LAB — CBC WITH DIFFERENTIAL (CANCER CENTER ONLY)
BASO#: 0 10*3/uL (ref 0.0–0.2)
BASO%: 0.3 % (ref 0.0–2.0)
EOS%: 0.8 % (ref 0.0–7.0)
Eosinophils Absolute: 0.1 10*3/uL (ref 0.0–0.5)
HCT: 46 % (ref 38.7–49.9)
HEMOGLOBIN: 15.5 g/dL (ref 13.0–17.1)
LYMPH#: 2.8 10*3/uL (ref 0.9–3.3)
LYMPH%: 25.7 % (ref 14.0–48.0)
MCH: 32.2 pg (ref 28.0–33.4)
MCHC: 33.7 g/dL (ref 32.0–35.9)
MCV: 95 fL (ref 82–98)
MONO#: 1 10*3/uL — ABNORMAL HIGH (ref 0.1–0.9)
MONO%: 8.7 % (ref 0.0–13.0)
NEUT#: 7.1 10*3/uL — ABNORMAL HIGH (ref 1.5–6.5)
NEUT%: 64.5 % (ref 40.0–80.0)
Platelets: 254 10*3/uL (ref 145–400)
RBC: 4.82 10*6/uL (ref 4.20–5.70)
RDW: 15.3 % (ref 11.1–15.7)
WBC: 11 10*3/uL — ABNORMAL HIGH (ref 4.0–10.0)

## 2014-02-25 LAB — FERRITIN CHCC: FERRITIN: 485 ng/mL — AB (ref 22–316)

## 2014-02-25 LAB — CMP (CANCER CENTER ONLY)
ALT(SGPT): 15 U/L (ref 10–47)
AST: 16 U/L (ref 11–38)
Albumin: 3 g/dL — ABNORMAL LOW (ref 3.3–5.5)
Alkaline Phosphatase: 45 U/L (ref 26–84)
BILIRUBIN TOTAL: 0.5 mg/dL (ref 0.20–1.60)
BUN: 17 mg/dL (ref 7–22)
CALCIUM: 9.4 mg/dL (ref 8.0–10.3)
CHLORIDE: 104 meq/L (ref 98–108)
CO2: 23 mEq/L (ref 18–33)
CREATININE: 0.6 mg/dL (ref 0.6–1.2)
Glucose, Bld: 139 mg/dL — ABNORMAL HIGH (ref 73–118)
Potassium: 3.6 mEq/L (ref 3.3–4.7)
Sodium: 136 mEq/L (ref 128–145)
Total Protein: 7 g/dL (ref 6.4–8.1)

## 2014-02-25 LAB — IRON AND TIBC CHCC
%SAT: 46 % (ref 20–55)
IRON: 125 ug/dL (ref 42–163)
TIBC: 269 ug/dL (ref 202–409)
UIBC: 144 ug/dL (ref 117–376)

## 2014-02-25 MED ORDER — IMMUNE GLOBULIN (HUMAN) 20 GM/200ML IV SOLN
40.0000 g | Freq: Once | INTRAVENOUS | Status: AC
  Administered 2014-02-25: 40 g via INTRAVENOUS
  Filled 2014-02-25: qty 400

## 2014-02-25 MED ORDER — TESTOSTERONE CYPIONATE 200 MG/ML IM SOLN
INTRAMUSCULAR | Status: AC
  Filled 2014-02-25: qty 1

## 2014-02-25 MED ORDER — TESTOSTERONE CYPIONATE 200 MG/ML IM SOLN
200.0000 mg | INTRAMUSCULAR | Status: DC
  Administered 2014-02-25: 200 mg via INTRAMUSCULAR

## 2014-02-25 MED ORDER — ACETAMINOPHEN 325 MG PO TABS
650.0000 mg | ORAL_TABLET | Freq: Once | ORAL | Status: AC
  Administered 2014-02-25: 650 mg via ORAL

## 2014-02-25 MED ORDER — CYANOCOBALAMIN 1000 MCG/ML IJ SOLN
INTRAMUSCULAR | Status: AC
  Filled 2014-02-25: qty 1

## 2014-02-25 MED ORDER — CYANOCOBALAMIN 1000 MCG/ML IJ SOLN
1000.0000 ug | Freq: Once | INTRAMUSCULAR | Status: AC
Start: 2014-02-25 — End: 2014-02-25
  Administered 2014-02-25: 1000 ug via INTRAMUSCULAR

## 2014-02-25 MED ORDER — DIPHENHYDRAMINE HCL 25 MG PO CAPS
25.0000 mg | ORAL_CAPSULE | Freq: Once | ORAL | Status: AC
  Administered 2014-02-25: 25 mg via ORAL

## 2014-02-25 MED ORDER — SODIUM CHLORIDE 0.9 % IV SOLN
INTRAVENOUS | Status: DC
  Administered 2014-02-25: 10:00:00 via INTRAVENOUS

## 2014-02-25 MED ORDER — IMMUNE GLOBULIN (HUMAN) 10 GM/200ML IV SOLN: 40.0000 g | Freq: Once | INTRAVENOUS | Status: DC

## 2014-02-25 MED ORDER — ACETAMINOPHEN 325 MG PO TABS
ORAL_TABLET | ORAL | Status: AC
  Filled 2014-02-25: qty 2

## 2014-02-25 MED ORDER — DIPHENHYDRAMINE HCL 25 MG PO CAPS
ORAL_CAPSULE | ORAL | Status: AC
  Filled 2014-02-25: qty 1

## 2014-02-25 NOTE — Patient Instructions (Signed)

## 2014-02-25 NOTE — Progress Notes (Signed)
Hematology and Oncology Follow Up Visit  [name] [name] [id number] 1940-12-31 73 y.o. 02/25/2014   Principle Diagnosis:   Lymphoplasmacytic lymphoma-recurrent  Hypogammaglobulinemia with recurrent pneumonia  Alcohol induced neuropathy and dementia       Vitamin B12 deficiency secondary to malabsorption  Current Therapy:    Imbruvica 420mg  po q day -he is or taking 140 mg a day  IVIG 40 gm IV q 6weeks  B12 1mg  IM q 6 wk  Depo-Testosterone 200 mg IM every 2 weeks     Interim History:  Mr.  [name] is back for followup. He actually looks a lot better. We now have him on some Megace to help with his appetite. He is eating much better.  We got him back on the Imbruvica. He is only taking 1 pill a day. I will try to get him up to 2 pills a day. So far, he's had no problems taking this.  He's going to follow him once since we last saw him.  He is getting his testosterone injections. His family thinks that he is doing much better. He feels more energetic. He does not have a cane with him. He's not complaining of any pain. He's had no nausea or vomiting. There's been no issues with diarrhea. He may have a little bit of constipation.  He has not had any rashes. He's had no bleeding. He has occasional bruising. Medications: Current outpatient prescriptions: escitalopram (LEXAPRO) 10 MG tablet, Take 1 tablet (10 mg total) by mouth daily., Disp: 30 tablet, Rfl: 6;  fish oil-omega-3 fatty acids 1000 MG capsule, Take 1 g by mouth daily. , Disp: , Rfl: ;  ibrutinib (IMBRUVICA) 140 MG capsul, Take 3 capsules (420 mg total) by mouth daily., Disp: 90 capsule, Rfl: 3 megestrol (MEGACE) 400 MG/10ML suspension, Take 1 tablespoon a day for appetite and weight gain, Disp: 480 mL, Rfl: 4;  Pyridoxine HCl (VITAMIN B-6) 250 MG tablet, Take 1 tablet (250 mg total) by mouth daily., Disp: 30 tablet, Rfl: 12;  traMADol (ULTRAM) 50 MG tablet, Take 1 tablet (50 mg total) by mouth every 6 (six) hours as needed.,  Disp: 90 tablet, Rfl: 3 No current facility-administered medications for this visit. Facility-Administered Medications Ordered in Other Visits: cyanocobalamin ((VITAMIN B-12)) injection 1,000 mcg, 1,000 mcg, Intramuscular, Once, Volanda Napoleon, MD;  testosterone cypionate (DEPOTESTOTERONE CYPIONATE) injection 200 mg, 200 mg, Intramuscular, Q14 Days, Volanda Napoleon, MD  Allergies: No Known Allergies  Past Medical History, Surgical history, Social history, and Family History were reviewed and updated.  Review of Systems: As above  Physical Exam:  height is 5\' 8"  (1.727 m) and weight is 145 lb (65.772 kg). His oral temperature is 97.8 F (36.6 C). His blood pressure is 116/67 and his pulse is 98. His respiration is 18.    thin, white gentleman in no obvious distress. Head and exam is no ocular or oral lesions. He has no scleral icterus. There is no mucositis. He has no adenopathy in his neck. His lungs are clear. Her cardiac exam regular rate and rhythm with no murmurs, rubs or bruits. Abdomen is soft. He has good bowel sounds. There is no fluid wave. There is no palpable liver or spleen tip. Extremities shows better muscle tone in the upper and lower extremities. Back exam shows no tenderness over the spine, ribs or hips. Neurological exam is nonfocal. Skin exam no rashes.  Lab Results  Component Value Date   WBC 11.0* 02/25/2014   HGB 15.5 02/25/2014  HCT 46.0 02/25/2014   MCV 95 02/25/2014   PLT 254 02/25/2014     Chemistry      Component Value Date/Time   NA 136 02/25/2014 0828   NA 135 08/06/2013 0851   K 3.6 02/25/2014 0828   K 4.3 08/06/2013 0851   CL 104 02/25/2014 0828   CL 96 08/06/2013 0851   CO2 23 02/25/2014 0828   CO2 25 08/06/2013 0851   BUN 17 02/25/2014 0828   BUN 8 08/06/2013 0851   CREATININE 0.6 02/25/2014 0828   CREATININE 0.70 08/06/2013 0851      Component Value Date/Time   CALCIUM 9.4 02/25/2014 0828   CALCIUM 8.8 08/06/2013 0851   ALKPHOS 45  02/25/2014 0828   ALKPHOS 67 08/06/2013 0851   AST 16 02/25/2014 0828   AST 12 08/06/2013 0851   ALT 15 02/25/2014 0828   ALT 9 08/06/2013 0851   BILITOT 0.50 02/25/2014 0828   BILITOT 0.3 08/06/2013 0851         Impression and Plan: [name] [name] is 73 year old with recurrent lymphoplasmacytic lymphoma. Again, he is looking better. The fact that his total protein is down and his albumin is up on his labs today indicates that he is responding again to treatment. It will be very interesting to see what his monoclonal protein studies are like.  The fact that he is gaining weight certainly is encouraging to me.  We will continue him on the testosterone. I want to continue him on every two-week testosterone as I think this is helping him.  I don't think he will have a problem with going up to 280 mg a day of Imbruvica.  We will plan to do his IVIG today.  I will plan to get him back in another 6 weeks.[name], MD 11/13/20159:37 AM

## 2014-03-01 ENCOUNTER — Telehealth: Payer: Self-pay | Admitting: *Deleted

## 2014-03-01 LAB — PROTEIN ELECTROPHORESIS, SERUM, WITH REFLEX
ALBUMIN ELP: 48.2 % — AB (ref 55.8–66.1)
ALPHA-2-GLOBULIN: 12.5 % — AB (ref 7.1–11.8)
Alpha-1-Globulin: 6 % — ABNORMAL HIGH (ref 2.9–4.9)
BETA GLOBULIN: 6 % (ref 4.7–7.2)
Beta 2: 19.1 % — ABNORMAL HIGH (ref 3.2–6.5)
Gamma Globulin: 8.2 % — ABNORMAL LOW (ref 11.1–18.8)
M-SPIKE, %: 1.09 g/dL
TOTAL PROTEIN, SERUM ELECTROPHOR: 6.9 g/dL (ref 6.0–8.3)

## 2014-03-01 LAB — TESTOSTERONE: TESTOSTERONE: 68 ng/dL — AB (ref 300–890)

## 2014-03-01 LAB — IGG, IGA, IGM
IGM, SERUM: 1500 mg/dL — AB (ref 41–251)
IgA: 13 mg/dL — ABNORMAL LOW (ref 68–379)
IgG (Immunoglobin G), Serum: 687 mg/dL (ref 650–1600)

## 2014-03-01 LAB — IFE INTERPRETATION

## 2014-03-01 LAB — KAPPA/LAMBDA LIGHT CHAINS
KAPPA LAMBDA RATIO: 32.54 — AB (ref 0.26–1.65)
Kappa free light chain: 23.1 mg/dL — ABNORMAL HIGH (ref 0.33–1.94)
Lambda Free Lght Chn: 0.71 mg/dL (ref 0.57–2.63)

## 2014-03-01 NOTE — Telephone Encounter (Addendum)
Spoke with wife. She will share info with son.   ----- Message from Volanda Napoleon, MD sent at 02/28/2014  2:32 PM EST ----- Please call and tell him that the protein levels are down by half. Keep taking the Imbruvica. pete

## 2014-03-11 ENCOUNTER — Ambulatory Visit: Payer: Medicare Other

## 2014-03-11 ENCOUNTER — Telehealth: Payer: Self-pay | Admitting: Hematology & Oncology

## 2014-03-11 NOTE — Telephone Encounter (Signed)
Pt aware of 11-30 inj

## 2014-03-14 ENCOUNTER — Ambulatory Visit (HOSPITAL_BASED_OUTPATIENT_CLINIC_OR_DEPARTMENT_OTHER): Payer: Medicare Other

## 2014-03-14 DIAGNOSIS — C880 Waldenstrom macroglobulinemia: Secondary | ICD-10-CM

## 2014-03-14 DIAGNOSIS — R7989 Other specified abnormal findings of blood chemistry: Secondary | ICD-10-CM

## 2014-03-14 DIAGNOSIS — E291 Testicular hypofunction: Secondary | ICD-10-CM

## 2014-03-14 DIAGNOSIS — D510 Vitamin B12 deficiency anemia due to intrinsic factor deficiency: Secondary | ICD-10-CM

## 2014-03-14 MED ORDER — TESTOSTERONE CYPIONATE 200 MG/ML IM SOLN
200.0000 mg | INTRAMUSCULAR | Status: DC
  Administered 2014-03-14: 200 mg via INTRAMUSCULAR

## 2014-03-14 MED ORDER — TESTOSTERONE CYPIONATE 200 MG/ML IM SOLN
INTRAMUSCULAR | Status: AC
  Filled 2014-03-14: qty 1

## 2014-03-14 NOTE — Patient Instructions (Signed)

## 2014-03-25 ENCOUNTER — Ambulatory Visit: Payer: Medicare Other

## 2014-04-01 ENCOUNTER — Ambulatory Visit (HOSPITAL_BASED_OUTPATIENT_CLINIC_OR_DEPARTMENT_OTHER): Payer: Medicare Other | Admitting: Lab

## 2014-04-01 ENCOUNTER — Encounter: Payer: Self-pay | Admitting: Hematology & Oncology

## 2014-04-01 ENCOUNTER — Ambulatory Visit (HOSPITAL_BASED_OUTPATIENT_CLINIC_OR_DEPARTMENT_OTHER): Payer: Medicare Other | Admitting: Hematology & Oncology

## 2014-04-01 ENCOUNTER — Other Ambulatory Visit: Payer: Self-pay | Admitting: *Deleted

## 2014-04-01 ENCOUNTER — Ambulatory Visit (HOSPITAL_BASED_OUTPATIENT_CLINIC_OR_DEPARTMENT_OTHER): Payer: Medicare Other

## 2014-04-01 ENCOUNTER — Telehealth: Payer: Self-pay | Admitting: *Deleted

## 2014-04-01 VITALS — BP 116/73 | HR 100 | Temp 97.6°F | Resp 20 | Ht 68.0 in | Wt 146.0 lb

## 2014-04-01 DIAGNOSIS — D510 Vitamin B12 deficiency anemia due to intrinsic factor deficiency: Secondary | ICD-10-CM

## 2014-04-01 DIAGNOSIS — D801 Nonfamilial hypogammaglobulinemia: Secondary | ICD-10-CM

## 2014-04-01 DIAGNOSIS — T451X5A Adverse effect of antineoplastic and immunosuppressive drugs, initial encounter: Principal | ICD-10-CM

## 2014-04-01 DIAGNOSIS — C8800 Waldenstrom macroglobulinemia not having achieved remission: Secondary | ICD-10-CM

## 2014-04-01 DIAGNOSIS — E291 Testicular hypofunction: Secondary | ICD-10-CM

## 2014-04-01 DIAGNOSIS — E538 Deficiency of other specified B group vitamins: Secondary | ICD-10-CM

## 2014-04-01 DIAGNOSIS — D6481 Anemia due to antineoplastic chemotherapy: Secondary | ICD-10-CM

## 2014-04-01 DIAGNOSIS — R7989 Other specified abnormal findings of blood chemistry: Secondary | ICD-10-CM

## 2014-04-01 DIAGNOSIS — C8590 Non-Hodgkin lymphoma, unspecified, unspecified site: Secondary | ICD-10-CM

## 2014-04-01 LAB — IRON AND TIBC CHCC
%SAT: 40 % (ref 20–55)
IRON: 92 ug/dL (ref 42–163)
TIBC: 229 ug/dL (ref 202–409)
UIBC: 136 ug/dL (ref 117–376)

## 2014-04-01 LAB — CBC WITH DIFFERENTIAL (CANCER CENTER ONLY)
BASO#: 0 10*3/uL (ref 0.0–0.2)
BASO%: 0.3 % (ref 0.0–2.0)
EOS%: 0.7 % (ref 0.0–7.0)
Eosinophils Absolute: 0.1 10*3/uL (ref 0.0–0.5)
HCT: 44 % (ref 38.7–49.9)
HGB: 15.2 g/dL (ref 13.0–17.1)
LYMPH#: 3.3 10*3/uL (ref 0.9–3.3)
LYMPH%: 25.9 % (ref 14.0–48.0)
MCH: 32.5 pg (ref 28.0–33.4)
MCHC: 34.5 g/dL (ref 32.0–35.9)
MCV: 94 fL (ref 82–98)
MONO#: 1.3 10*3/uL — AB (ref 0.1–0.9)
MONO%: 10.3 % (ref 0.0–13.0)
NEUT#: 7.9 10*3/uL — ABNORMAL HIGH (ref 1.5–6.5)
NEUT%: 62.8 % (ref 40.0–80.0)
PLATELETS: 246 10*3/uL (ref 145–400)
RBC: 4.67 10*6/uL (ref 4.20–5.70)
RDW: 13.8 % (ref 11.1–15.7)
WBC: 12.6 10*3/uL — AB (ref 4.0–10.0)

## 2014-04-01 LAB — CHCC SATELLITE - SMEAR

## 2014-04-01 LAB — FERRITIN CHCC: FERRITIN: 486 ng/mL — AB (ref 22–316)

## 2014-04-01 MED ORDER — DIPHENHYDRAMINE HCL 25 MG PO CAPS
ORAL_CAPSULE | ORAL | Status: AC
  Filled 2014-04-01: qty 1

## 2014-04-01 MED ORDER — ACETAMINOPHEN 325 MG PO TABS
650.0000 mg | ORAL_TABLET | Freq: Once | ORAL | Status: AC
  Administered 2014-04-01: 650 mg via ORAL

## 2014-04-01 MED ORDER — CYANOCOBALAMIN 1000 MCG/ML IJ SOLN
1000.0000 ug | Freq: Once | INTRAMUSCULAR | Status: AC
  Administered 2014-04-01: 1000 ug via INTRAMUSCULAR

## 2014-04-01 MED ORDER — IMMUNE GLOBULIN (HUMAN) 20 GM/200ML IV SOLN
40.0000 g | INTRAVENOUS | Status: DC
  Administered 2014-04-01: 40 g via INTRAVENOUS
  Filled 2014-04-01: qty 400

## 2014-04-01 MED ORDER — IMMUNE GLOBULIN (HUMAN) 10 GM/200ML IV SOLN: 40.0000 g | Freq: Once | INTRAVENOUS | Status: DC

## 2014-04-01 MED ORDER — TESTOSTERONE CYPIONATE 200 MG/ML IM SOLN
200.0000 mg | INTRAMUSCULAR | Status: DC
  Administered 2014-04-01: 200 mg via INTRAMUSCULAR

## 2014-04-01 MED ORDER — TESTOSTERONE CYPIONATE 200 MG/ML IM SOLN
INTRAMUSCULAR | Status: AC
  Filled 2014-04-01: qty 1

## 2014-04-01 MED ORDER — CYANOCOBALAMIN 1000 MCG/ML IJ SOLN
INTRAMUSCULAR | Status: AC
  Filled 2014-04-01: qty 1

## 2014-04-01 MED ORDER — ACETAMINOPHEN 325 MG PO TABS
ORAL_TABLET | ORAL | Status: AC
  Filled 2014-04-01: qty 2

## 2014-04-01 MED ORDER — DIPHENHYDRAMINE HCL 25 MG PO CAPS
25.0000 mg | ORAL_CAPSULE | Freq: Once | ORAL | Status: AC
  Administered 2014-04-01: 25 mg via ORAL

## 2014-04-01 MED ORDER — IBRUTINIB 140 MG PO CAPS
420.0000 mg | ORAL_CAPSULE | Freq: Every day | ORAL | Status: DC
Start: 2014-04-01 — End: 2014-08-10

## 2014-04-01 NOTE — Patient Instructions (Signed)
Testosterone injection What is this medicine? TESTOSTERONE (tes TOS ter one) is the main male hormone. It supports normal male development such as muscle growth, facial hair, and deep voice. It is used in males to treat low testosterone levels. This medicine may be used for other purposes; ask your health care provider or pharmacist if you have questions. COMMON BRAND NAME(S): Andro-L.A., Aveed, Delatestryl, Depo-Testosterone, Virilon What should I tell my health care provider before I take this medicine? They need to know if you have any of these conditions: -breast cancer -diabetes -heart disease -kidney disease -liver disease -lung disease -prostate cancer, enlargement -an unusual or allergic reaction to testosterone, other medicines, foods, dyes, or preservatives -pregnant or trying to get pregnant -breast-feeding How should I use this medicine? This medicine is for injection into a muscle. It is usually given by a health care professional in a hospital or clinic setting. Contact your pediatrician regarding the use of this medicine in children. While this medicine may be prescribed for children as young as 12 years of age for selected conditions, precautions do apply. Overdosage: If you think you have taken too much of this medicine contact a poison control center or emergency room at once. NOTE: This medicine is only for you. Do not share this medicine with others. What if I miss a dose? Try not to miss a dose. Your doctor or health care professional will tell you when your next injection is due. Notify the office if you are unable to keep an appointment. What may interact with this medicine? -medicines for diabetes -medicines that treat or prevent blood clots like warfarin -oxyphenbutazone -propranolol -steroid medicines like prednisone or cortisone This list may not describe all possible interactions. Give your health care provider a list of all the medicines, herbs,  non-prescription drugs, or dietary supplements you use. Also tell them if you smoke, drink alcohol, or use illegal drugs. Some items may interact with your medicine. What should I watch for while using this medicine? Visit your doctor or health care professional for regular checks on your progress. They will need to check the level of testosterone in your blood. This medicine is only approved for use in men who have low levels of testosterone related to certain medical conditions. Heart attacks and strokes have been reported with the use of this medicine. Notify your doctor or health care professional and seek emergency treatment if you develop breathing problems; changes in vision; confusion; chest pain or chest tightness; sudden arm pain; severe, sudden headache; trouble speaking or understanding; sudden numbness or weakness of the face, arm or leg; loss of balance or coordination. Talk to your doctor about the risks and benefits of this medicine. This medicine may affect blood sugar levels. If you have diabetes, check with your doctor or health care professional before you change your diet or the dose of your diabetic medicine. This drug is banned from use in athletes by most athletic organizations. What side effects may I notice from receiving this medicine? Side effects that you should report to your doctor or health care professional as soon as possible: -allergic reactions like skin rash, itching or hives, swelling of the face, lips, or tongue -breast enlargement -breathing problems -changes in mood, especially anger, depression, or rage -dark urine -general ill feeling or flu-like symptoms -light-colored stools -loss of appetite, nausea -nausea, vomiting -right upper belly pain -stomach pain -swelling of ankles -too frequent or persistent erections -trouble passing urine or change in the amount of urine -unusually   weak or tired -yellowing of the eyes or skin Additional side effects  that can occur in women include: -deep or hoarse voice -facial hair growth -irregular menstrual periods Side effects that usually do not require medical attention (report to your doctor or health care professional if they continue or are bothersome): -acne -change in sex drive or performance -hair loss -headache This list may not describe all possible side effects. Call your doctor for medical advice about side effects. You may report side effects to FDA at 1-800-FDA-1088. Where should I keep my medicine? Keep out of the reach of children. This medicine can be abused. Keep your medicine in a safe place to protect it from theft. Do not share this medicine with anyone. Selling or giving away this medicine is dangerous and against the law. Store at room temperature between 20 and 25 degrees C (68 and 77 degrees F). Do not freeze. Protect from light. Follow the directions for the product you are prescribed. Throw away any unused medicine after the expiration date. NOTE: This sheet is a summary. It may not cover all possible information. If you have questions about this medicine, talk to your doctor, pharmacist, or health care provider.  2015, Elsevier/Gold Standard. (2013-06-17 10:25:85) Cyanocobalamin, Vitamin B12 injection What is this medicine? CYANOCOBALAMIN (sye an oh koe BAL a min) is a man made form of vitamin B12. Vitamin B12 is used in the growth of healthy blood cells, nerve cells, and proteins in the body. It also helps with the metabolism of fats and carbohydrates. This medicine is used to treat people who can not absorb vitamin B12. This medicine may be used for other purposes; ask your health care provider or pharmacist if you have questions. COMMON BRAND NAME(S): Cyomin, LA-12, Nutri-Twelve, Primabalt What should I tell my health care provider before I take this medicine? They need to know if you have any of these conditions: -kidney disease -Leber's disease -megaloblastic  anemia -an unusual or allergic reaction to cyanocobalamin, cobalt, other medicines, foods, dyes, or preservatives -pregnant or trying to get pregnant -breast-feeding How should I use this medicine? This medicine is injected into a muscle or deeply under the skin. It is usually given by a health care professional in a clinic or doctor's office. However, your doctor may teach you how to inject yourself. Follow all instructions. Talk to your pediatrician regarding the use of this medicine in children. Special care may be needed. Overdosage: If you think you have taken too much of this medicine contact a poison control center or emergency room at once. NOTE: This medicine is only for you. Do not share this medicine with others. What if I miss a dose? If you are given your dose at a clinic or doctor's office, call to reschedule your appointment. If you give your own injections and you miss a dose, take it as soon as you can. If it is almost time for your next dose, take only that dose. Do not take double or extra doses. What may interact with this medicine? -colchicine -heavy alcohol intake This list may not describe all possible interactions. Give your health care provider a list of all the medicines, herbs, non-prescription drugs, or dietary supplements you use. Also tell them if you smoke, drink alcohol, or use illegal drugs. Some items may interact with your medicine. What should I watch for while using this medicine? Visit your doctor or health care professional regularly. You may need blood work done while you are taking this medicine.  You may need to follow a special diet. Talk to your doctor. Limit your alcohol intake and avoid smoking to get the best benefit. What side effects may I notice from receiving this medicine? Side effects that you should report to your doctor or health care professional as soon as possible: -allergic reactions like skin rash, itching or hives, swelling of the face,  lips, or tongue -blue tint to skin -chest tightness, pain -difficulty breathing, wheezing -dizziness -red, swollen painful area on the leg Side effects that usually do not require medical attention (report to your doctor or health care professional if they continue or are bothersome): -diarrhea -headache This list may not describe all possible side effects. Call your doctor for medical advice about side effects. You may report side effects to FDA at 1-800-FDA-1088. Where should I keep my medicine? Keep out of the reach of children. Store at room temperature between 15 and 30 degrees C (59 and 85 degrees F). Protect from light. Throw away any unused medicine after the expiration date. NOTE: This sheet is a summary. It may not cover all possible information. If you have questions about this medicine, talk to your doctor, pharmacist, or health care provider.  2015, Elsevier/Gold Standard. (2007-07-13 22:10:20) Immune Globulin Injection What is this medicine? IMMUNE GLOBULIN (im MUNE GLOB yoo lin) helps to prevent or reduce the severity of certain infections in patients who are at risk. This medicine is collected from the pooled blood of many donors. It is used to treat immune system problems, thrombocytopenia, and Kawasaki syndrome. This medicine may be used for other purposes; ask your health care provider or pharmacist if you have questions. COMMON BRAND NAME(S): Baygam, BIVIGAM, Carimune, Carimune NF, Flebogamma, Flebogamma DIF, GamaSTAN S/D, Gamimune N, Gammagard S/D, Gammaked, Gammaplex, Gammar-P IV, Gamunex, Gamunex-C, Hizentra, Iveegam, Iveegam EN, Octagam, Panglobulin, Panglobulin NF, Polygam S/D, Privigen, Sandoglobulin, Venoglobulin-S, Vigam, Vivaglobulin What should I tell my health care provider before I take this medicine? They need to know if you have any of these conditions: - diabetes - extremely low or no immune antibodies in the blood - heart disease - history of blood  clots - hyperprolinemia - infection in the blood, sepsis - kidney disease - taking medicine that may change kidney function - ask your health care provider about your medicine - an unusual or allergic reaction to human immune globulin, albumin, maltose, sucrose, polysorbate 80, other medicines, foods, dyes, or preservatives - pregnant or trying to get pregnant - breast-feeding How should I use this medicine? This medicine is for injection into a muscle or infusion into a vein or skin. It is usually given by a health care professional in a hospital or clinic setting. In rare cases, some brands of this medicine might be given at home. You will be taught how to give this medicine. Use exactly as directed. Take your medicine at regular intervals. Do not take your medicine more often than directed. Talk to your pediatrician regarding the use of this medicine in children. Special care may be needed. Overdosage: If you think you have taken too much of this medicine contact a poison control center or emergency room at once. NOTE: This medicine is only for you. Do not share this medicine with others. What if I miss a dose? It is important not to miss your dose. Call your doctor or health care professional if you are unable to keep an appointment. If you give yourself the medicine and you miss a dose, take it as soon as  you can. If it is almost time for your next dose, take only that dose. Do not take double or extra doses. What may interact with this medicine? -aspirin and aspirin-like medicines -cisplatin -cyclosporine -medicines for infection like acyclovir, adefovir, amphotericin B, bacitracin, cidofovir, foscarnet, ganciclovir, gentamicin, pentamidine, vancomycin -NSAIDS, medicines for pain and inflammation, like ibuprofen or naproxen -pamidronate -vaccines -zoledronic acid This list may not describe all possible interactions. Give your health care provider a list of all the medicines,  herbs, non-prescription drugs, or dietary supplements you use. Also tell them if you smoke, drink alcohol, or use illegal drugs. Some items may interact with your medicine. What should I watch for while using this medicine? Your condition will be monitored carefully while you are receiving this medicine. This medicine is made from pooled blood donations of many different people. It may be possible to pass an infection in this medicine. However, the donors are screened for infections and all products are tested for HIV and hepatitis. The medicine is treated to kill most or all bacteria and viruses. Talk to your doctor about the risks and benefits of this medicine. Do not have vaccinations for at least 14 days before, or until at least 3 months after receiving this medicine. What side effects may I notice from receiving this medicine? Side effects that you should report to your doctor or health care professional as soon as possible: -allergic reactions like skin rash, itching or hives, swelling of the face, lips, or tongue -breathing problems -chest pain or tightness -fever, chills -headache with nausea, vomiting -neck pain or difficulty moving neck -pain when moving eyes -pain, swelling, warmth in the leg -problems with balance, talking, walking -sudden weight gain -swelling of the ankles, feet, hands -trouble passing urine or change in the amount of urine Side effects that usually do not require medical attention (report to your doctor or health care professional if they continue or are bothersome): -dizzy, drowsy -flushing -increased sweating -leg cramps -muscle aches and pains -pain at site where injected This list may not describe all possible side effects. Call your doctor for medical advice about side effects. You may report side effects to FDA at 1-800-FDA-1088. Where should I keep my medicine? Keep out of the reach of children. This drug is usually given in a hospital or clinic  and will not be stored at home. In rare cases, some brands of this medicine may be given at home. If you are using this medicine at home, you will be instructed on how to store this medicine. Throw away any unused medicine after the expiration date on the label. NOTE: This sheet is a summary. It may not cover all possible information. If you have questions about this medicine, talk to your doctor, pharmacist, or health care provider.  2015, Elsevier/Gold Standard. (2008-06-22 11:44:49)

## 2014-04-01 NOTE — Telephone Encounter (Signed)
Called in [place] prescription to [facility].

## 2014-04-01 NOTE — Progress Notes (Signed)
Hematology and Oncology Follow Up Visit  [name] [name] [id number] [birth year] 73 y.o. 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  [name] [name] is back for followup. He actually looks better. We now have him on some Megace to help with his appetite. He is eating much better. I'm not sure if he is agitated in the Megace.  He did follow for his dog recently. Thank you, he did not break any bones.  We got him back on the Imbruvica. He is only taking 2 pills a day. I will try to get him up to 3 pills a day. So far, he's had no problems taking this.  He is responding very nicely to the [place]. His M spike was down to 1.09 g/L. His IgM level was down to 1500 mg/dL. HisKappa light chain was down to 23.1 mg/dL. [name] [name], we will see that his levels continue to improve.    He is getting his testosterone injections. His family thinks that he is doing much better. He feels more energetic. He's not complaining of any pain. He's had no nausea or vomiting. There's been no issues with diarrhea. He may have a little bit of constipation.  Medications: Current outpatient prescriptions: escitalopram (LEXAPRO) 10 MG tablet, Take 1 tablet (10 mg total) by mouth daily., Disp: 30 tablet, Rfl: 6;  fish oil-omega-3 fatty acids 1000 MG capsule, Take 1 g by mouth daily. , Disp: , Rfl: ;  ibrutinib (IMBRUVICA) 140 MG capsul, Take 3 capsules (420 mg total) by mouth daily., Disp: 90 capsule, Rfl: 3 megestrol (MEGACE) 400 MG/10ML suspension, Take 1 tablespoon a day for appetite and weight gain, Disp: 480 mL, Rfl: 4;  Pyridoxine HCl (VITAMIN B-6) 250 MG tablet, Take 1 tablet (250 mg total)  by mouth daily., Disp: 30 tablet, Rfl: 12;  traMADol (ULTRAM) 50 MG tablet, Take 1 tablet (50 mg total) by mouth every 6 (six) hours as needed., Disp: 90 tablet, Rfl: 3  Allergies: No Known Allergies  Past Medical History, Surgical history, Social history, and Family History were reviewed and updated.  Review of Systems: As above  Physical Exam:  height is 5\' 8"  (1.727 m) and weight is 146 lb (66.225 kg). His oral temperature is 97.6 F (36.4 C). His blood pressure is 116/73 and his pulse is 100. His respiration is 20.    thin, white gentleman in no obvious distress. Head and exam is no ocular or oral lesions. He has no scleral icterus. There is no mucositis. He has no adenopathy in his neck. His lungs are clear. His is cardiac exam regular rate and rhythm with no murmurs, rubs or bruits. Abdomen is soft. He has good bowel sounds. There is no fluid wave. There is no palpable liver or spleen tip. Extremities shows better muscle tone in the upper and lower extremities. Back exam shows no tenderness over the spine, ribs or hips. Neurological exam is nonfocal. Skin exam no rashes.  Lab Results  Component Value Date   WBC 12.6* 04/01/2014   HGB 15.2 04/01/2014   HCT 44.0 04/01/2014   MCV 94 04/01/2014   PLT 246 04/01/2014  Chemistry      Component Value Date/Time   NA 137 04/01/2014 0820   NA 136 02/25/2014 0828   K 3.8 04/01/2014 0820   K 3.6 02/25/2014 0828   CL 104 04/01/2014 0820   CL 104 02/25/2014 0828   CO2 21 04/01/2014 0820   CO2 23 02/25/2014 0828   BUN 12 04/01/2014 0820   BUN 17 02/25/2014 0828   CREATININE 0.69 04/01/2014 0820   CREATININE 0.6 02/25/2014 0828      Component Value Date/Time   CALCIUM 9.1 04/01/2014 0820   CALCIUM 9.4 02/25/2014 0828   ALKPHOS 56 04/01/2014 0820   ALKPHOS 45 02/25/2014 0828   AST 10 04/01/2014 0820   AST 16 02/25/2014 0828   ALT <8 04/01/2014 0820   ALT 15 02/25/2014 0828   BILITOT 0.3 04/01/2014 0820   BILITOT 0.50  02/25/2014 0828     Ferritin is 486. Iron saturation is 40%. Total iron is 92.  Total protein is 6.4. Albumin is 3.1.  Impression and Plan: Mr. Genther is a 73 year old with recurrent lymphoplasmacytic lymphoma. Again, he is looking better. The fact that his total protein is down and his albumin is up on his labs today indicates that he is responding again to treatment. It will be very interesting to see what his monoclonal protein studies are like. Again, his total protein is down so I have to believe that he is responding.  The fact that he is gaining weight certainly is encouraging to me.  We will continue him on the testosterone. I want to continue him on every two-week testosterone as I think this is helping him.  I don't think he will have a problem with going up to 420 mg a day of Imbruvica.  We will plan to do his IVIG today.  I will plan to get him back in another 6 weeks.[name] Kirschner, MD 12/18/20154:45 PM

## 2014-04-05 LAB — COMPREHENSIVE METABOLIC PANEL
ALT: <8 U/L (ref 0–53)
AST: 10 U/L (ref 0–37)
Albumin: 3.1 g/dL — ABNORMAL LOW (ref 3.5–5.2)
Alkaline Phosphatase: 56 U/L (ref 39–117)
BUN: 12 mg/dL (ref 6–23)
CHLORIDE: 104 meq/L (ref 96–112)
CO2: 21 meq/L (ref 19–32)
Calcium: 9.1 mg/dL (ref 8.4–10.5)
Creatinine, Ser: 0.69 mg/dL (ref 0.50–1.35)
Glucose, Bld: 133 mg/dL — ABNORMAL HIGH (ref 70–99)
Potassium: 3.8 mEq/L (ref 3.5–5.3)
SODIUM: 137 meq/L (ref 135–145)
TOTAL PROTEIN: 6.4 g/dL (ref 6.0–8.3)
Total Bilirubin: 0.3 mg/dL (ref 0.2–1.2)

## 2014-04-05 LAB — IGG, IGA, IGM
IGG (IMMUNOGLOBIN G), SERUM: 641 mg/dL — AB (ref 650–1600)
IgA: 11 mg/dL — ABNORMAL LOW (ref 68–379)
IgM, Serum: 1500 mg/dL — ABNORMAL HIGH (ref 41–251)

## 2014-04-05 LAB — VITAMIN B12: Vitamin B-12: 761 pg/mL (ref 211–911)

## 2014-04-05 LAB — PROTEIN ELECTROPHORESIS, SERUM, WITH REFLEX
ALBUMIN ELP: 45.5 % — AB (ref 55.8–66.1)
ALPHA-1-GLOBULIN: 7.3 % — AB (ref 2.9–4.9)
Alpha-2-Globulin: 14.3 % — ABNORMAL HIGH (ref 7.1–11.8)
BETA 2: 19 % — AB (ref 3.2–6.5)
Beta Globulin: 5.7 % (ref 4.7–7.2)
Gamma Globulin: 8.2 % — ABNORMAL LOW (ref 11.1–18.8)
M-Spike, %: 1.02 g/dL
Total Protein, Serum Electrophoresis: 6.4 g/dL (ref 6.0–8.3)

## 2014-04-05 LAB — KAPPA/LAMBDA LIGHT CHAINS
Kappa free light chain: 32.2 mg/dL — ABNORMAL HIGH (ref 0.33–1.94)
Kappa:Lambda Ratio: 103.87 — ABNORMAL HIGH (ref 0.26–1.65)
Lambda Free Lght Chn: 0.31 mg/dL — ABNORMAL LOW (ref 0.57–2.63)

## 2014-04-05 LAB — IFE INTERPRETATION

## 2014-04-05 LAB — TESTOSTERONE: TESTOSTERONE: 142 ng/dL — AB (ref 300–890)

## 2014-04-07 ENCOUNTER — Telehealth: Payer: Self-pay | Admitting: *Deleted

## 2014-04-07 NOTE — Telephone Encounter (Addendum)
Message left on son's voice mail  --- Message from Volanda Napoleon, MD sent at 04/06/2014  1:47 PM EST ----- Please call his son and let him know that the lymphoma continues to improve. Please make sure that he is taking his pills like he should. He needs to be taking 3 a day. Thanks

## 2014-04-14 ENCOUNTER — Ambulatory Visit (HOSPITAL_BASED_OUTPATIENT_CLINIC_OR_DEPARTMENT_OTHER): Payer: Medicare Other

## 2014-04-14 DIAGNOSIS — D510 Vitamin B12 deficiency anemia due to intrinsic factor deficiency: Secondary | ICD-10-CM

## 2014-04-14 DIAGNOSIS — E291 Testicular hypofunction: Secondary | ICD-10-CM

## 2014-04-14 DIAGNOSIS — C880 Waldenstrom macroglobulinemia: Secondary | ICD-10-CM

## 2014-04-14 DIAGNOSIS — R7989 Other specified abnormal findings of blood chemistry: Secondary | ICD-10-CM

## 2014-04-14 MED ORDER — TESTOSTERONE CYPIONATE 200 MG/ML IM SOLN
200.0000 mg | INTRAMUSCULAR | Status: DC
  Administered 2014-04-14: 200 mg via INTRAMUSCULAR

## 2014-04-14 MED ORDER — TESTOSTERONE CYPIONATE 200 MG/ML IM SOLN
INTRAMUSCULAR | Status: AC
  Filled 2014-04-14: qty 1

## 2014-04-14 NOTE — Patient Instructions (Signed)

## 2014-04-29 ENCOUNTER — Ambulatory Visit (HOSPITAL_BASED_OUTPATIENT_CLINIC_OR_DEPARTMENT_OTHER): Payer: Medicare Other

## 2014-04-29 ENCOUNTER — Ambulatory Visit (HOSPITAL_BASED_OUTPATIENT_CLINIC_OR_DEPARTMENT_OTHER): Payer: Medicare Other | Admitting: Lab

## 2014-04-29 ENCOUNTER — Ambulatory Visit (HOSPITAL_BASED_OUTPATIENT_CLINIC_OR_DEPARTMENT_OTHER): Payer: Medicare Other | Admitting: Family

## 2014-04-29 ENCOUNTER — Encounter: Payer: Self-pay | Admitting: Family

## 2014-04-29 VITALS — BP 197/63 | HR 105 | Temp 98.1°F | Resp 18 | Ht 68.0 in | Wt 143.0 lb

## 2014-04-29 DIAGNOSIS — D510 Vitamin B12 deficiency anemia due to intrinsic factor deficiency: Secondary | ICD-10-CM

## 2014-04-29 DIAGNOSIS — C8590 Non-Hodgkin lymphoma, unspecified, unspecified site: Secondary | ICD-10-CM

## 2014-04-29 DIAGNOSIS — C880 Waldenstrom macroglobulinemia: Secondary | ICD-10-CM

## 2014-04-29 DIAGNOSIS — T451X5A Adverse effect of antineoplastic and immunosuppressive drugs, initial encounter: Principal | ICD-10-CM

## 2014-04-29 DIAGNOSIS — R7989 Other specified abnormal findings of blood chemistry: Secondary | ICD-10-CM

## 2014-04-29 DIAGNOSIS — E291 Testicular hypofunction: Secondary | ICD-10-CM

## 2014-04-29 DIAGNOSIS — D6481 Anemia due to antineoplastic chemotherapy: Secondary | ICD-10-CM

## 2014-04-29 DIAGNOSIS — D801 Nonfamilial hypogammaglobulinemia: Secondary | ICD-10-CM

## 2014-04-29 LAB — FERRITIN CHCC: Ferritin: 1012 ng/ml — ABNORMAL HIGH (ref 22–316)

## 2014-04-29 LAB — CBC WITH DIFFERENTIAL (CANCER CENTER ONLY)
BASO#: 0 10*3/uL (ref 0.0–0.2)
BASO%: 0.5 % (ref 0.0–2.0)
EOS%: 0.7 % (ref 0.0–7.0)
Eosinophils Absolute: 0.1 10*3/uL (ref 0.0–0.5)
HEMATOCRIT: 45.5 % (ref 38.7–49.9)
HGB: 15.2 g/dL (ref 13.0–17.1)
LYMPH#: 2 10*3/uL (ref 0.9–3.3)
LYMPH%: 24.5 % (ref 14.0–48.0)
MCH: 32 pg (ref 28.0–33.4)
MCHC: 33.4 g/dL (ref 32.0–35.9)
MCV: 96 fL (ref 82–98)
MONO#: 0.9 10*3/uL (ref 0.1–0.9)
MONO%: 11 % (ref 0.0–13.0)
NEUT#: 5.2 10*3/uL (ref 1.5–6.5)
NEUT%: 63.3 % (ref 40.0–80.0)
Platelets: 243 10*3/uL (ref 145–400)
RBC: 4.75 10*6/uL (ref 4.20–5.70)
RDW: 13.6 % (ref 11.1–15.7)
WBC: 8.2 10*3/uL (ref 4.0–10.0)

## 2014-04-29 LAB — CMP (CANCER CENTER ONLY)
ALT: 9 U/L — AB (ref 10–47)
AST: 14 U/L (ref 11–38)
Albumin: 2.8 g/dL — ABNORMAL LOW (ref 3.3–5.5)
Alkaline Phosphatase: 55 U/L (ref 26–84)
BUN: 11 mg/dL (ref 7–22)
CALCIUM: 9.2 mg/dL (ref 8.0–10.3)
CHLORIDE: 96 meq/L — AB (ref 98–108)
CO2: 27 mEq/L (ref 18–33)
CREATININE: 0.8 mg/dL (ref 0.6–1.2)
Glucose, Bld: 186 mg/dL — ABNORMAL HIGH (ref 73–118)
Potassium: 3.7 mEq/L (ref 3.3–4.7)
SODIUM: 135 meq/L (ref 128–145)
Total Bilirubin: 0.7 mg/dl (ref 0.20–1.60)
Total Protein: 7.4 g/dL (ref 6.4–8.1)

## 2014-04-29 LAB — IRON AND TIBC CHCC
%SAT: 28 % (ref 20–55)
Iron: 60 ug/dL (ref 42–163)
TIBC: 213 ug/dL (ref 202–409)
UIBC: 153 ug/dL (ref 117–376)

## 2014-04-29 LAB — CHCC SATELLITE - SMEAR

## 2014-04-29 MED ORDER — SODIUM CHLORIDE 0.9 % IV SOLN
INTRAVENOUS | Status: DC
  Administered 2014-04-29: 10:00:00 via INTRAVENOUS

## 2014-04-29 MED ORDER — IMMUNE GLOBULIN (HUMAN) 10 GM/200ML IV SOLN: 40.0000 g | Freq: Once | INTRAVENOUS | Status: DC

## 2014-04-29 MED ORDER — ACETAMINOPHEN 325 MG PO TABS
650.0000 mg | ORAL_TABLET | Freq: Once | ORAL | Status: AC
  Administered 2014-04-29: 650 mg via ORAL

## 2014-04-29 MED ORDER — DIPHENHYDRAMINE HCL 25 MG PO CAPS
ORAL_CAPSULE | ORAL | Status: AC
  Filled 2014-04-29: qty 1

## 2014-04-29 MED ORDER — TESTOSTERONE CYPIONATE 200 MG/ML IM SOLN
INTRAMUSCULAR | Status: AC
  Filled 2014-04-29: qty 1

## 2014-04-29 MED ORDER — TESTOSTERONE CYPIONATE 200 MG/ML IM SOLN
200.0000 mg | INTRAMUSCULAR | Status: DC
  Administered 2014-04-29: 200 mg via INTRAMUSCULAR

## 2014-04-29 MED ORDER — DIPHENHYDRAMINE HCL 25 MG PO CAPS
25.0000 mg | ORAL_CAPSULE | Freq: Once | ORAL | Status: AC
  Administered 2014-04-29: 25 mg via ORAL

## 2014-04-29 MED ORDER — IMMUNE GLOBULIN (HUMAN) 20 GM/200ML IV SOLN
40.0000 g | Freq: Once | INTRAVENOUS | Status: AC
  Administered 2014-04-29: 40 g via INTRAVENOUS
  Filled 2014-04-29: qty 400

## 2014-04-29 MED ORDER — ACETAMINOPHEN 325 MG PO TABS
ORAL_TABLET | ORAL | Status: AC
  Filled 2014-04-29: qty 2

## 2014-04-29 MED ORDER — CYANOCOBALAMIN 1000 MCG/ML IJ SOLN
INTRAMUSCULAR | Status: AC
  Filled 2014-04-29: qty 1

## 2014-04-29 MED ORDER — CYANOCOBALAMIN 1000 MCG/ML IJ SOLN
1000.0000 ug | Freq: Once | INTRAMUSCULAR | Status: AC
  Administered 2014-04-29: 1000 ug via INTRAMUSCULAR

## 2014-04-29 NOTE — Progress Notes (Signed)
[facility]  [phone] [phone]  ID: [facility] OB: 1941-01-07 MR#: [id number]#:[id number] Patient Care Team: Robyn Haber, MD as PCP - General (Family Medicine) Volanda Napoleon, MD as Referring Physician (Medical Oncology)  DIAGNOSIS: Lymphoplasmacytic lymphoma-recurrent  Hypogammaglobulinemia with recurrent pneumonia  Alcohol induced neuropathy and dementia Vitamin B12 deficiency secondary to malabsorption  INTERVAL HISTORY: [name] [name] is here today for a follow-up. He is feeling better. He is still having trouble with falling but thankfully has not hurt himself.  In December, his monoclonal spike was up to 1.02 g/dL IgM level was 1500 mg/dL kappa light chain was 32.20 mg/dL. He's doing well on the Imbruvica.  He denies fever, chills, n/v, cough, rash, headache, dizziness, SOB, chest pain, palpitations, abdominal pain, constipation, diarrhea, blood in urine or stool.  No swelling, tenderness, numbness or tingling his extremities. His appetite is a bit better and he is trying to drink plenty of fluids. His weight is down 3 lbs at 143 lbs.   CURRENT TREATMENT: Imbruvica 420mg  po q day  IVIG 40 gm IV q 6weeks  B12 1mg  IM q 6 wk Depo-Testosterone 200 mg IM every 2 weeks  REVIEW OF SYSTEMS: All other 10 point review of systems is negative except for those issues mentioned above.   PAST MEDICAL HISTORY: Past Medical History  Diagnosis Date  . Lymphadenopathy   . Hyperlipidemia   . COPD (chronic obstructive pulmonary disease)   . Chills   . Low grade fever   . Unintentional weight loss   . Nasal congestion   . Hearing loss   . Weakness   . Substance abuse     beer consumption - 6 per day.  . Cancer     lymphoma  . Waldenstrom macroglobulinemia 07/10/2011  . Pernicious anemia 05/05/2013   PAST SURGICAL HISTORY: Past Surgical History  Procedure Laterality Date  . Back surgery  2000   FAMILY HISTORY Family History  Problem Relation  Age of Onset  . Cancer Mother     stomach  . Stroke Father    GYNECOLOGIC HISTORY:  No LMP for male patient.   SOCIAL HISTORY:  History   Social History  . Marital Status: Married    Spouse Name: N/A    Number of Children: N/A  . Years of Education: N/A   Occupational History  . Not on file.   Social History Main Topics  . Smoking status: Former Smoker -- 2.00 packs/day for 40 years    Types: Cigarettes    Start date: 06/26/1950    Quit date: 07/04/2001  . Smokeless tobacco: Never Used     Comment: quit 12 yeras ago  . Alcohol Use: Yes     Comment: 6 beers per day.  . Drug Use: No  . Sexual Activity: No   Other Topics Concern  . Not on file   Social History Narrative   ADVANCED DIRECTIVES: <no information>  HEALTH MAINTENANCE: History  Substance Use Topics  . Smoking status: Former Smoker -- 2.00 packs/day for 40 years    Types: Cigarettes    Start date: 06/26/1950    Quit date: 07/04/2001  . Smokeless tobacco: Never Used     Comment: quit 12 yeras ago  . Alcohol Use: Yes     Comment: 6 beers per day.   Colonoscopy: PAP: Bone density: Lipid panel:  No Known Allergies  Current Outpatient Prescriptions  Medication Sig Dispense Refill  . escitalopram (LEXAPRO) 10 MG tablet Take 1  tablet (10 mg total) by mouth daily. 30 tablet 6  . fish oil-omega-3 fatty acids 1000 MG capsule Take 1 g by mouth daily.     Marland Kitchen ibrutinib (IMBRUVICA) 140 MG capsul Take 3 capsules (420 mg total) by mouth daily. 90 capsule 3  . megestrol (MEGACE) 400 MG/10ML suspension Take 1 tablespoon a day for appetite and weight gain 480 mL 4  . Pyridoxine HCl (VITAMIN B-6) 250 MG tablet Take 1 tablet (250 mg total) by mouth daily. 30 tablet 12  . traMADol (ULTRAM) 50 MG tablet Take 1 tablet (50 mg total) by mouth every 6 (six) hours as needed. 90 tablet 3   No current facility-administered medications for this visit.   OBJECTIVE: There were no vitals filed for this visit. There is no  weight on file to calculate BMI. ECOG FS:0 - Asymptomatic Ocular: Sclerae unicteric, pupils equal, round and reactive to light Ear-nose-throat: Oropharynx clear, dentition fair Lymphatic: No cervical or supraclavicular adenopathy Lungs no rales or rhonchi, good excursion bilaterally Heart regular rate and rhythm, no murmur appreciated Abd soft, nontender, positive bowel sounds MSK no focal spinal tenderness, no joint edema Neuro: non-focal, well-oriented, appropriate affect  LAB RESULTS: CMP     Component Value Date/Time   NA 137 04/01/2014 0820   NA 136 02/25/2014 0828   K 3.8 04/01/2014 0820   K 3.6 02/25/2014 0828   CL 104 04/01/2014 0820   CL 104 02/25/2014 0828   CO2 21 04/01/2014 0820   CO2 23 02/25/2014 0828   GLUCOSE 133* 04/01/2014 0820   GLUCOSE 139* 02/25/2014 0828   BUN 12 04/01/2014 0820   BUN 17 02/25/2014 0828   CREATININE 0.69 04/01/2014 0820   CREATININE 0.6 02/25/2014 0828   CALCIUM 9.1 04/01/2014 0820   CALCIUM 9.4 02/25/2014 0828   PROT 6.4 04/01/2014 0820   PROT 7.0 02/25/2014 0828   ALBUMIN 3.1* 04/01/2014 0820   AST 10 04/01/2014 0820   AST 16 02/25/2014 0828   ALT <8 04/01/2014 0820   ALT 15 02/25/2014 0828   ALKPHOS 56 04/01/2014 0820   ALKPHOS 45 02/25/2014 0828   BILITOT 0.3 04/01/2014 0820   BILITOT 0.50 02/25/2014 0828   GFRNONAA >90 04/27/2013 0433   GFRAA >90 04/27/2013 0433   No results found for: SPEP Lab Results  Component Value Date   WBC 8.2 04/29/2014   NEUTROABS 5.2 04/29/2014   HGB 15.2 04/29/2014   HCT 45.5 04/29/2014   MCV 96 04/29/2014   PLT 243 04/29/2014   No results found for: LABCA2 No components found for: DTOIZ124 No results for input(s): INR in the last 168 hours.  STUDIES: No results found.  ASSESSMENT/PLAN: [name] is a 74 yo male with recurrent lymphoplasmacytic lymphoma. He is doing well on the [place]. He is feeling better but still falling at home. Thankfully he has not hurt himself.  His CBC is  normal today. We will see what the rest of his labs show.  He will get IVIG today as well as B 12 and Testosterone injections.  We will see him back in 1 month for labs and follow-up appointment.  He knows to call here with any questions or concerns and to go to the ED in the event of an emergency. We can certainly see him sooner if need be.    Eliezer Bottom, NP 04/29/2014 9:42 AM

## 2014-04-29 NOTE — Patient Instructions (Signed)
Testosterone injection What is this medicine? TESTOSTERONE (tes TOS ter one) is the main male hormone. It supports normal male development such as muscle growth, facial hair, and deep voice. It is used in males to treat low testosterone levels. This medicine may be used for other purposes; ask your health care provider or pharmacist if you have questions. COMMON BRAND NAME(S): Andro-L.A., Aveed, Delatestryl, Depo-Testosterone, Virilon What should I tell my health care provider before I take this medicine? They need to know if you have any of these conditions: -breast cancer -diabetes -heart disease -kidney disease -liver disease -lung disease -prostate cancer, enlargement -an unusual or allergic reaction to testosterone, other medicines, foods, dyes, or preservatives -pregnant or trying to get pregnant -breast-feeding How should I use this medicine? This medicine is for injection into a muscle. It is usually given by a health care professional in a hospital or clinic setting. Contact your pediatrician regarding the use of this medicine in children. While this medicine may be prescribed for children as young as 12 years of age for selected conditions, precautions do apply. Overdosage: If you think you have taken too much of this medicine contact a poison control center or emergency room at once. NOTE: This medicine is only for you. Do not share this medicine with others. What if I miss a dose? Try not to miss a dose. Your doctor or health care professional will tell you when your next injection is due. Notify the office if you are unable to keep an appointment. What may interact with this medicine? -medicines for diabetes -medicines that treat or prevent blood clots like warfarin -oxyphenbutazone -propranolol -steroid medicines like prednisone or cortisone This list may not describe all possible interactions. Give your health care provider a list of all the medicines, herbs,  non-prescription drugs, or dietary supplements you use. Also tell them if you smoke, drink alcohol, or use illegal drugs. Some items may interact with your medicine. What should I watch for while using this medicine? Visit your doctor or health care professional for regular checks on your progress. They will need to check the level of testosterone in your blood. This medicine is only approved for use in men who have low levels of testosterone related to certain medical conditions. Heart attacks and strokes have been reported with the use of this medicine. Notify your doctor or health care professional and seek emergency treatment if you develop breathing problems; changes in vision; confusion; chest pain or chest tightness; sudden arm pain; severe, sudden headache; trouble speaking or understanding; sudden numbness or weakness of the face, arm or leg; loss of balance or coordination. Talk to your doctor about the risks and benefits of this medicine. This medicine may affect blood sugar levels. If you have diabetes, check with your doctor or health care professional before you change your diet or the dose of your diabetic medicine. This drug is banned from use in athletes by most athletic organizations. What side effects may I notice from receiving this medicine? Side effects that you should report to your doctor or health care professional as soon as possible: -allergic reactions like skin rash, itching or hives, swelling of the face, lips, or tongue -breast enlargement -breathing problems -changes in mood, especially anger, depression, or rage -dark urine -general ill feeling or flu-like symptoms -light-colored stools -loss of appetite, nausea -nausea, vomiting -right upper belly pain -stomach pain -swelling of ankles -too frequent or persistent erections -trouble passing urine or change in the amount of urine -unusually   weak or tired -yellowing of the eyes or skin Additional side effects  that can occur in women include: -deep or hoarse voice -facial hair growth -irregular menstrual periods Side effects that usually do not require medical attention (report to your doctor or health care professional if they continue or are bothersome): -acne -change in sex drive or performance -hair loss -headache This list may not describe all possible side effects. Call your doctor for medical advice about side effects. You may report side effects to FDA at 1-800-FDA-1088. Where should I keep my medicine? Keep out of the reach of children. This medicine can be abused. Keep your medicine in a safe place to protect it from theft. Do not share this medicine with anyone. Selling or giving away this medicine is dangerous and against the law. Store at room temperature between 20 and 25 degrees C (68 and 77 degrees F). Do not freeze. Protect from light. Follow the directions for the product you are prescribed. Throw away any unused medicine after the expiration date. NOTE: This sheet is a summary. It may not cover all possible information. If you have questions about this medicine, talk to your doctor, pharmacist, or health care provider.  2015, Elsevier/Gold Standard. (2013-06-17 84:16:60) Cyanocobalamin, Vitamin B12 injection What is this medicine? CYANOCOBALAMIN (sye an oh koe BAL a min) is a man made form of vitamin B12. Vitamin B12 is used in the growth of healthy blood cells, nerve cells, and proteins in the body. It also helps with the metabolism of fats and carbohydrates. This medicine is used to treat people who can not absorb vitamin B12. This medicine may be used for other purposes; ask your health care provider or pharmacist if you have questions. COMMON BRAND NAME(S): Cyomin, LA-12, Nutri-Twelve, Primabalt What should I tell my health care provider before I take this medicine? They need to know if you have any of these conditions: -kidney disease -Leber's disease -megaloblastic  anemia -an unusual or allergic reaction to cyanocobalamin, cobalt, other medicines, foods, dyes, or preservatives -pregnant or trying to get pregnant -breast-feeding How should I use this medicine? This medicine is injected into a muscle or deeply under the skin. It is usually given by a health care professional in a clinic or doctor's office. However, your doctor may teach you how to inject yourself. Follow all instructions. Talk to your pediatrician regarding the use of this medicine in children. Special care may be needed. Overdosage: If you think you have taken too much of this medicine contact a poison control center or emergency room at once. NOTE: This medicine is only for you. Do not share this medicine with others. What if I miss a dose? If you are given your dose at a clinic or doctor's office, call to reschedule your appointment. If you give your own injections and you miss a dose, take it as soon as you can. If it is almost time for your next dose, take only that dose. Do not take double or extra doses. What may interact with this medicine? -colchicine -heavy alcohol intake This list may not describe all possible interactions. Give your health care provider a list of all the medicines, herbs, non-prescription drugs, or dietary supplements you use. Also tell them if you smoke, drink alcohol, or use illegal drugs. Some items may interact with your medicine. What should I watch for while using this medicine? Visit your doctor or health care professional regularly. You may need blood work done while you are taking this medicine.  You may need to follow a special diet. Talk to your doctor. Limit your alcohol intake and avoid smoking to get the best benefit. What side effects may I notice from receiving this medicine? Side effects that you should report to your doctor or health care professional as soon as possible: -allergic reactions like skin rash, itching or hives, swelling of the face,  lips, or tongue -blue tint to skin -chest tightness, pain -difficulty breathing, wheezing -dizziness -red, swollen painful area on the leg Side effects that usually do not require medical attention (report to your doctor or health care professional if they continue or are bothersome): -diarrhea -headache This list may not describe all possible side effects. Call your doctor for medical advice about side effects. You may report side effects to FDA at 1-800-FDA-1088. Where should I keep my medicine? Keep out of the reach of children. Store at room temperature between 15 and 30 degrees C (59 and 85 degrees F). Protect from light. Throw away any unused medicine after the expiration date. NOTE: This sheet is a summary. It may not cover all possible information. If you have questions about this medicine, talk to your doctor, pharmacist, or health care provider.  2015, Elsevier/Gold Standard. (2007-07-13 22:10:20) Immune Globulin Injection What is this medicine? IMMUNE GLOBULIN (im MUNE GLOB yoo lin) helps to prevent or reduce the severity of certain infections in patients who are at risk. This medicine is collected from the pooled blood of many donors. It is used to treat immune system problems, thrombocytopenia, and Kawasaki syndrome. This medicine may be used for other purposes; ask your health care provider or pharmacist if you have questions. COMMON BRAND NAME(S): Baygam, BIVIGAM, Carimune, Carimune NF, Flebogamma, Flebogamma DIF, GamaSTAN S/D, Gamimune N, Gammagard S/D, Gammaked, Gammaplex, Gammar-P IV, Gamunex, Gamunex-C, Hizentra, Iveegam, Iveegam EN, Octagam, Panglobulin, Panglobulin NF, Polygam S/D, Privigen, Sandoglobulin, Venoglobulin-S, Vigam, Vivaglobulin What should I tell my health care provider before I take this medicine? They need to know if you have any of these conditions: - diabetes - extremely low or no immune antibodies in the blood - heart disease - history of blood  clots - hyperprolinemia - infection in the blood, sepsis - kidney disease - taking medicine that may change kidney function - ask your health care provider about your medicine - an unusual or allergic reaction to human immune globulin, albumin, maltose, sucrose, polysorbate 80, other medicines, foods, dyes, or preservatives - pregnant or trying to get pregnant - breast-feeding How should I use this medicine? This medicine is for injection into a muscle or infusion into a vein or skin. It is usually given by a health care professional in a hospital or clinic setting. In rare cases, some brands of this medicine might be given at home. You will be taught how to give this medicine. Use exactly as directed. Take your medicine at regular intervals. Do not take your medicine more often than directed. Talk to your pediatrician regarding the use of this medicine in children. Special care may be needed. Overdosage: If you think you have taken too much of this medicine contact a poison control center or emergency room at once. NOTE: This medicine is only for you. Do not share this medicine with others. What if I miss a dose? It is important not to miss your dose. Call your doctor or health care professional if you are unable to keep an appointment. If you give yourself the medicine and you miss a dose, take it as soon as  you can. If it is almost time for your next dose, take only that dose. Do not take double or extra doses. What may interact with this medicine? -aspirin and aspirin-like medicines -cisplatin -cyclosporine -medicines for infection like acyclovir, adefovir, amphotericin B, bacitracin, cidofovir, foscarnet, ganciclovir, gentamicin, pentamidine, vancomycin -NSAIDS, medicines for pain and inflammation, like ibuprofen or naproxen -pamidronate -vaccines -zoledronic acid This list may not describe all possible interactions. Give your health care provider a list of all the medicines,  herbs, non-prescription drugs, or dietary supplements you use. Also tell them if you smoke, drink alcohol, or use illegal drugs. Some items may interact with your medicine. What should I watch for while using this medicine? Your condition will be monitored carefully while you are receiving this medicine. This medicine is made from pooled blood donations of many different people. It may be possible to pass an infection in this medicine. However, the donors are screened for infections and all products are tested for HIV and hepatitis. The medicine is treated to kill most or all bacteria and viruses. Talk to your doctor about the risks and benefits of this medicine. Do not have vaccinations for at least 14 days before, or until at least 3 months after receiving this medicine. What side effects may I notice from receiving this medicine? Side effects that you should report to your doctor or health care professional as soon as possible: -allergic reactions like skin rash, itching or hives, swelling of the face, lips, or tongue -breathing problems -chest pain or tightness -fever, chills -headache with nausea, vomiting -neck pain or difficulty moving neck -pain when moving eyes -pain, swelling, warmth in the leg -problems with balance, talking, walking -sudden weight gain -swelling of the ankles, feet, hands -trouble passing urine or change in the amount of urine Side effects that usually do not require medical attention (report to your doctor or health care professional if they continue or are bothersome): -dizzy, drowsy -flushing -increased sweating -leg cramps -muscle aches and pains -pain at site where injected This list may not describe all possible side effects. Call your doctor for medical advice about side effects. You may report side effects to FDA at 1-800-FDA-1088. Where should I keep my medicine? Keep out of the reach of children. This drug is usually given in a hospital or clinic  and will not be stored at home. In rare cases, some brands of this medicine may be given at home. If you are using this medicine at home, you will be instructed on how to store this medicine. Throw away any unused medicine after the expiration date on the label. NOTE: This sheet is a summary. It may not cover all possible information. If you have questions about this medicine, talk to your doctor, pharmacist, or health care provider.  2015, Elsevier/Gold Standard. (2008-06-22 11:44:49)

## 2014-05-03 LAB — IGG, IGA, IGM
IGG (IMMUNOGLOBIN G), SERUM: 711 mg/dL (ref 650–1600)
IGM, SERUM: 1530 mg/dL — AB (ref 41–251)
IgA: 10 mg/dL — ABNORMAL LOW (ref 68–379)

## 2014-05-03 LAB — KAPPA/LAMBDA LIGHT CHAINS
Kappa free light chain: 27.8 mg/dL — ABNORMAL HIGH (ref 0.33–1.94)
Kappa:Lambda Ratio: 102.96 — ABNORMAL HIGH (ref 0.26–1.65)
Lambda Free Lght Chn: 0.27 mg/dL — ABNORMAL LOW (ref 0.57–2.63)

## 2014-05-03 LAB — PROTEIN ELECTROPHORESIS, SERUM, WITH REFLEX
ALPHA-2-GLOBULIN: 13.3 % — AB (ref 7.1–11.8)
Albumin ELP: 42.8 % — ABNORMAL LOW (ref 55.8–66.1)
Alpha-1-Globulin: 9.9 % — ABNORMAL HIGH (ref 2.9–4.9)
BETA 2: 18.3 % — AB (ref 3.2–6.5)
BETA GLOBULIN: 6.4 % (ref 4.7–7.2)
Gamma Globulin: 9.3 % — ABNORMAL LOW (ref 11.1–18.8)
M-SPIKE, %: 1.07 g/dL
Total Protein, Serum Electrophoresis: 6.8 g/dL (ref 6.0–8.3)

## 2014-05-03 LAB — TESTOSTERONE: TESTOSTERONE: 231 ng/dL — AB (ref 300–890)

## 2014-05-03 LAB — RETICULOCYTES (CHCC)
ABS RETIC: 37.9 10*3/uL (ref 19.0–186.0)
RBC.: 4.74 MIL/uL (ref 4.22–5.81)
RETIC CT PCT: 0.8 % (ref 0.4–2.3)

## 2014-05-03 LAB — IFE INTERPRETATION

## 2014-05-04 ENCOUNTER — Telehealth: Payer: Self-pay | Admitting: *Deleted

## 2014-05-04 NOTE — Telephone Encounter (Addendum)
Reviewed message with son. He is in agreement.  ----- Message from Volanda Napoleon, MD sent at 05/04/2014  1:29 PM EST ----- Call his son -- the lymphoma is stable, but i feel that is will start to become more active soon.  PLEASE make sure he is taking the Imbruvica properly!!!! [name] [name]

## 2014-05-13 ENCOUNTER — Ambulatory Visit (HOSPITAL_BASED_OUTPATIENT_CLINIC_OR_DEPARTMENT_OTHER): Payer: Medicare Other

## 2014-05-13 DIAGNOSIS — C880 Waldenstrom macroglobulinemia: Secondary | ICD-10-CM

## 2014-05-13 DIAGNOSIS — R7989 Other specified abnormal findings of blood chemistry: Secondary | ICD-10-CM

## 2014-05-13 DIAGNOSIS — E291 Testicular hypofunction: Secondary | ICD-10-CM

## 2014-05-13 DIAGNOSIS — D510 Vitamin B12 deficiency anemia due to intrinsic factor deficiency: Secondary | ICD-10-CM

## 2014-05-13 MED ORDER — TESTOSTERONE CYPIONATE 200 MG/ML IM SOLN
INTRAMUSCULAR | Status: AC
  Filled 2014-05-13: qty 1

## 2014-05-13 MED ORDER — TESTOSTERONE CYPIONATE 200 MG/ML IM SOLN
200.0000 mg | INTRAMUSCULAR | Status: DC
Start: 2014-05-13 — End: 2014-05-13
  Administered 2014-05-13: 200 mg via INTRAMUSCULAR

## 2014-05-13 NOTE — Patient Instructions (Signed)

## 2014-05-25 ENCOUNTER — Telehealth: Payer: Self-pay | Admitting: Hematology & Oncology

## 2014-05-25 NOTE — Telephone Encounter (Signed)
Son moved 2-11 to 2-18

## 2014-05-26 ENCOUNTER — Other Ambulatory Visit: Payer: Medicare Other | Admitting: Lab

## 2014-05-26 ENCOUNTER — Ambulatory Visit: Payer: Medicare Other

## 2014-05-26 ENCOUNTER — Ambulatory Visit: Payer: Medicare Other | Admitting: Hematology & Oncology

## 2014-06-02 ENCOUNTER — Ambulatory Visit (HOSPITAL_BASED_OUTPATIENT_CLINIC_OR_DEPARTMENT_OTHER): Payer: Medicare Other | Admitting: Hematology & Oncology

## 2014-06-02 ENCOUNTER — Ambulatory Visit (HOSPITAL_BASED_OUTPATIENT_CLINIC_OR_DEPARTMENT_OTHER): Payer: Medicare Other | Admitting: Lab

## 2014-06-02 ENCOUNTER — Encounter: Payer: Self-pay | Admitting: Hematology & Oncology

## 2014-06-02 ENCOUNTER — Ambulatory Visit (HOSPITAL_BASED_OUTPATIENT_CLINIC_OR_DEPARTMENT_OTHER): Payer: Medicare Other

## 2014-06-02 VITALS — BP 123/69 | HR 80 | Temp 97.6°F | Resp 20 | Ht 68.0 in | Wt 148.0 lb

## 2014-06-02 DIAGNOSIS — C8800 Waldenstrom macroglobulinemia not having achieved remission: Secondary | ICD-10-CM

## 2014-06-02 DIAGNOSIS — D801 Nonfamilial hypogammaglobulinemia: Secondary | ICD-10-CM

## 2014-06-02 DIAGNOSIS — E349 Endocrine disorder, unspecified: Secondary | ICD-10-CM

## 2014-06-02 DIAGNOSIS — D510 Vitamin B12 deficiency anemia due to intrinsic factor deficiency: Secondary | ICD-10-CM

## 2014-06-02 DIAGNOSIS — D500 Iron deficiency anemia secondary to blood loss (chronic): Secondary | ICD-10-CM

## 2014-06-02 DIAGNOSIS — C8300 Small cell B-cell lymphoma, unspecified site: Secondary | ICD-10-CM

## 2014-06-02 DIAGNOSIS — T451X5A Adverse effect of antineoplastic and immunosuppressive drugs, initial encounter: Principal | ICD-10-CM

## 2014-06-02 DIAGNOSIS — D6481 Anemia due to antineoplastic chemotherapy: Secondary | ICD-10-CM

## 2014-06-02 DIAGNOSIS — R7989 Other specified abnormal findings of blood chemistry: Secondary | ICD-10-CM

## 2014-06-02 DIAGNOSIS — E291 Testicular hypofunction: Secondary | ICD-10-CM | POA: Diagnosis not present

## 2014-06-02 LAB — CBC WITH DIFFERENTIAL (CANCER CENTER ONLY)
BASO#: 0.1 10*3/uL (ref 0.0–0.2)
BASO%: 0.8 % (ref 0.0–2.0)
EOS%: 1.7 % (ref 0.0–7.0)
Eosinophils Absolute: 0.1 10*3/uL (ref 0.0–0.5)
HCT: 45.5 % (ref 38.7–49.9)
HEMOGLOBIN: 15.3 g/dL (ref 13.0–17.1)
LYMPH#: 2.2 10*3/uL (ref 0.9–3.3)
LYMPH%: 30 % (ref 14.0–48.0)
MCH: 32.1 pg (ref 28.0–33.4)
MCHC: 33.6 g/dL (ref 32.0–35.9)
MCV: 95 fL (ref 82–98)
MONO#: 0.8 10*3/uL (ref 0.1–0.9)
MONO%: 10.8 % (ref 0.0–13.0)
NEUT#: 4.2 10*3/uL (ref 1.5–6.5)
NEUT%: 56.7 % (ref 40.0–80.0)
Platelets: 225 10*3/uL (ref 145–400)
RBC: 4.77 10*6/uL (ref 4.20–5.70)
RDW: 13.8 % (ref 11.1–15.7)
WBC: 7.4 10*3/uL (ref 4.0–10.0)

## 2014-06-02 LAB — IRON AND TIBC CHCC
%SAT: 35 % (ref 20–55)
Iron: 76 ug/dL (ref 42–163)
TIBC: 218 ug/dL (ref 202–409)
UIBC: 142 ug/dL (ref 117–376)

## 2014-06-02 LAB — CHCC SATELLITE - SMEAR

## 2014-06-02 LAB — FERRITIN CHCC: Ferritin: 670 ng/ml — ABNORMAL HIGH (ref 22–316)

## 2014-06-02 MED ORDER — SODIUM CHLORIDE 0.9 % IV SOLN
INTRAVENOUS | Status: DC
  Administered 2014-06-02: 10:00:00 via INTRAVENOUS

## 2014-06-02 MED ORDER — ACETAMINOPHEN 325 MG PO TABS
650.0000 mg | ORAL_TABLET | Freq: Once | ORAL | Status: AC
Start: 2014-06-02 — End: 2014-06-02
  Administered 2014-06-02: 650 mg via ORAL

## 2014-06-02 MED ORDER — TESTOSTERONE CYPIONATE 200 MG/ML IM SOLN
INTRAMUSCULAR | Status: AC
  Filled 2014-06-02: qty 1

## 2014-06-02 MED ORDER — IMMUNE GLOBULIN (HUMAN) 10 GM/200ML IV SOLN
40.0000 g | Freq: Once | INTRAVENOUS | Status: AC
  Administered 2014-06-02: 40 g via INTRAVENOUS
  Filled 2014-06-02: qty 800

## 2014-06-02 MED ORDER — CYANOCOBALAMIN 1000 MCG/ML IJ SOLN
1000.0000 ug | Freq: Once | INTRAMUSCULAR | Status: AC
  Administered 2014-06-02: 1000 ug via INTRAMUSCULAR

## 2014-06-02 MED ORDER — TESTOSTERONE CYPIONATE 200 MG/ML IM SOLN
200.0000 mg | INTRAMUSCULAR | Status: DC
  Administered 2014-06-02: 200 mg via INTRAMUSCULAR

## 2014-06-02 MED ORDER — DIPHENHYDRAMINE HCL 25 MG PO CAPS
ORAL_CAPSULE | ORAL | Status: AC
  Filled 2014-06-02: qty 1

## 2014-06-02 MED ORDER — DIPHENHYDRAMINE HCL 25 MG PO CAPS
25.0000 mg | ORAL_CAPSULE | Freq: Once | ORAL | Status: AC
  Administered 2014-06-02: 25 mg via ORAL

## 2014-06-02 MED ORDER — CYANOCOBALAMIN 1000 MCG/ML IJ SOLN
INTRAMUSCULAR | Status: AC
  Filled 2014-06-02: qty 1

## 2014-06-02 MED ORDER — ACETAMINOPHEN 325 MG PO TABS
ORAL_TABLET | ORAL | Status: AC
  Filled 2014-06-02: qty 2

## 2014-06-02 NOTE — Patient Instructions (Signed)
Testosterone injection What is this medicine? TESTOSTERONE (tes TOS ter one) is the main male hormone. It supports normal male development such as muscle growth, facial hair, and deep voice. It is used in males to treat low testosterone levels. This medicine may be used for other purposes; ask your health care provider or pharmacist if you have questions. COMMON BRAND NAME(S): Andro-L.A., Aveed, Delatestryl, Depo-Testosterone, Virilon What should I tell my health care provider before I take this medicine? They need to know if you have any of these conditions: -breast cancer -diabetes -heart disease -kidney disease -liver disease -lung disease -prostate cancer, enlargement -an unusual or allergic reaction to testosterone, other medicines, foods, dyes, or preservatives -pregnant or trying to get pregnant -breast-feeding How should I use this medicine? This medicine is for injection into a muscle. It is usually given by a health care professional in a hospital or clinic setting. Contact your pediatrician regarding the use of this medicine in children. While this medicine may be prescribed for children as young as 12 years of age for selected conditions, precautions do apply. Overdosage: If you think you have taken too much of this medicine contact a poison control center or emergency room at once. NOTE: This medicine is only for you. Do not share this medicine with others. What if I miss a dose? Try not to miss a dose. Your doctor or health care professional will tell you when your next injection is due. Notify the office if you are unable to keep an appointment. What may interact with this medicine? -medicines for diabetes -medicines that treat or prevent blood clots like warfarin -oxyphenbutazone -propranolol -steroid medicines like prednisone or cortisone This list may not describe all possible interactions. Give your health care provider a list of all the medicines, herbs,  non-prescription drugs, or dietary supplements you use. Also tell them if you smoke, drink alcohol, or use illegal drugs. Some items may interact with your medicine. What should I watch for while using this medicine? Visit your doctor or health care professional for regular checks on your progress. They will need to check the level of testosterone in your blood. This medicine is only approved for use in men who have low levels of testosterone related to certain medical conditions. Heart attacks and strokes have been reported with the use of this medicine. Notify your doctor or health care professional and seek emergency treatment if you develop breathing problems; changes in vision; confusion; chest pain or chest tightness; sudden arm pain; severe, sudden headache; trouble speaking or understanding; sudden numbness or weakness of the face, arm or leg; loss of balance or coordination. Talk to your doctor about the risks and benefits of this medicine. This medicine may affect blood sugar levels. If you have diabetes, check with your doctor or health care professional before you change your diet or the dose of your diabetic medicine. This drug is banned from use in athletes by most athletic organizations. What side effects may I notice from receiving this medicine? Side effects that you should report to your doctor or health care professional as soon as possible: -allergic reactions like skin rash, itching or hives, swelling of the face, lips, or tongue -breast enlargement -breathing problems -changes in mood, especially anger, depression, or rage -dark urine -general ill feeling or flu-like symptoms -light-colored stools -loss of appetite, nausea -nausea, vomiting -right upper belly pain -stomach pain -swelling of ankles -too frequent or persistent erections -trouble passing urine or change in the amount of urine -unusually   weak or tired -yellowing of the eyes or skin Additional side effects  that can occur in women include: -deep or hoarse voice -facial hair growth -irregular menstrual periods Side effects that usually do not require medical attention (report to your doctor or health care professional if they continue or are bothersome): -acne -change in sex drive or performance -hair loss -headache This list may not describe all possible side effects. Call your doctor for medical advice about side effects. You may report side effects to FDA at 1-800-FDA-1088. Where should I keep my medicine? Keep out of the reach of children. This medicine can be abused. Keep your medicine in a safe place to protect it from theft. Do not share this medicine with anyone. Selling or giving away this medicine is dangerous and against the law. Store at room temperature between 20 and 25 degrees C (68 and 77 degrees F). Do not freeze. Protect from light. Follow the directions for the product you are prescribed. Throw away any unused medicine after the expiration date. NOTE: This sheet is a summary. It may not cover all possible information. If you have questions about this medicine, talk to your doctor, pharmacist, or health care provider.  2015, Elsevier/Gold Standard. (2013-06-17 68:34:19)   Immune Globulin Injection What is this medicine? IMMUNE GLOBULIN (im MUNE GLOB yoo lin) helps to prevent or reduce the severity of certain infections in patients who are at risk. This medicine is collected from the pooled blood of many donors. It is used to treat immune system problems, thrombocytopenia, and Kawasaki syndrome. This medicine may be used for other purposes; ask your health care provider or pharmacist if you have questions. COMMON BRAND NAME(S): Baygam, BIVIGAM, Carimune, Carimune NF, Flebogamma, Flebogamma DIF, GamaSTAN S/D, Gamimune N, Gammagard S/D, Gammaked, Gammaplex, Gammar-P IV, Gamunex, Gamunex-C, Hizentra, Iveegam, Iveegam EN, Octagam, Panglobulin, Panglobulin NF, Polygam S/D, Privigen,  Sandoglobulin, Venoglobulin-S, Vigam, Vivaglobulin What should I tell my health care provider before I take this medicine? They need to know if you have any of these conditions: - diabetes - extremely low or no immune antibodies in the blood - heart disease - history of blood clots - hyperprolinemia - infection in the blood, sepsis - kidney disease - taking medicine that may change kidney function - ask your health care provider about your medicine - an unusual or allergic reaction to human immune globulin, albumin, maltose, sucrose, polysorbate 80, other medicines, foods, dyes, or preservatives - pregnant or trying to get pregnant - breast-feeding How should I use this medicine? This medicine is for injection into a muscle or infusion into a vein or skin. It is usually given by a health care professional in a hospital or clinic setting. In rare cases, some brands of this medicine might be given at home. You will be taught how to give this medicine. Use exactly as directed. Take your medicine at regular intervals. Do not take your medicine more often than directed. Talk to your pediatrician regarding the use of this medicine in children. Special care may be needed. Overdosage: If you think you have taken too much of this medicine contact a poison control center or emergency room at once. NOTE: This medicine is only for you. Do not share this medicine with others. What if I miss a dose? It is important not to miss your dose. Call your doctor or health care professional if you are unable to keep an appointment. If you give yourself the medicine and you miss a dose, take it as  soon as you can. If it is almost time for your next dose, take only that dose. Do not take double or extra doses. What may interact with this medicine? -aspirin and aspirin-like medicines -cisplatin -cyclosporine -medicines for infection like acyclovir, adefovir, amphotericin B, bacitracin, cidofovir,  foscarnet, ganciclovir, gentamicin, pentamidine, vancomycin -NSAIDS, medicines for pain and inflammation, like ibuprofen or naproxen -pamidronate -vaccines -zoledronic acid This list may not describe all possible interactions. Give your health care provider a list of all the medicines, herbs, non-prescription drugs, or dietary supplements you use. Also tell them if you smoke, drink alcohol, or use illegal drugs. Some items may interact with your medicine. What should I watch for while using this medicine? Your condition will be monitored carefully while you are receiving this medicine. This medicine is made from pooled blood donations of many different people. It may be possible to pass an infection in this medicine. However, the donors are screened for infections and all products are tested for HIV and hepatitis. The medicine is treated to kill most or all bacteria and viruses. Talk to your doctor about the risks and benefits of this medicine. Do not have vaccinations for at least 14 days before, or until at least 3 months after receiving this medicine. What side effects may I notice from receiving this medicine? Side effects that you should report to your doctor or health care professional as soon as possible: -allergic reactions like skin rash, itching or hives, swelling of the face, lips, or tongue -breathing problems -chest pain or tightness -fever, chills -headache with nausea, vomiting -neck pain or difficulty moving neck -pain when moving eyes -pain, swelling, warmth in the leg -problems with balance, talking, walking -sudden weight gain -swelling of the ankles, feet, hands -trouble passing urine or change in the amount of urine Side effects that usually do not require medical attention (report to your doctor or health care professional if they continue or are bothersome): -dizzy, drowsy -flushing -increased sweating -leg cramps -muscle aches and pains -pain at site where  injected This list may not describe all possible side effects. Call your doctor for medical advice about side effects. You may report side effects to FDA at 1-800-FDA-1088. Where should I keep my medicine? Keep out of the reach of children. This drug is usually given in a hospital or clinic and will not be stored at home. In rare cases, some brands of this medicine may be given at home. If you are using this medicine at home, you will be instructed on how to store this medicine. Throw away any unused medicine after the expiration date on the label. NOTE: This sheet is a summary. It may not cover all possible information. If you have questions about this medicine, talk to your doctor, pharmacist, or health care provider.  2015, Elsevier/Gold Standard. (2008-06-22 11:44:49)  Cyanocobalamin, Vitamin B12 injection What is this medicine? CYANOCOBALAMIN (sye an oh koe BAL a min) is a man made form of vitamin B12. Vitamin B12 is used in the growth of healthy blood cells, nerve cells, and proteins in the body. It also helps with the metabolism of fats and carbohydrates. This medicine is used to treat people who can not absorb vitamin B12. This medicine may be used for other purposes; ask your health care provider or pharmacist if you have questions. COMMON BRAND NAME(S): Cyomin, LA-12, Nutri-Twelve, Primabalt What should I tell my health care provider before I take this medicine? They need to know if you have any of these  conditions: -kidney disease -Leber's disease -megaloblastic anemia -an unusual or allergic reaction to cyanocobalamin, cobalt, other medicines, foods, dyes, or preservatives -pregnant or trying to get pregnant -breast-feeding How should I use this medicine? This medicine is injected into a muscle or deeply under the skin. It is usually given by a health care professional in a clinic or doctor's office. However, your doctor may teach you how to inject yourself. Follow all  instructions. Talk to your pediatrician regarding the use of this medicine in children. Special care may be needed. Overdosage: If you think you have taken too much of this medicine contact a poison control center or emergency room at once. NOTE: This medicine is only for you. Do not share this medicine with others. What if I miss a dose? If you are given your dose at a clinic or doctor's office, call to reschedule your appointment. If you give your own injections and you miss a dose, take it as soon as you can. If it is almost time for your next dose, take only that dose. Do not take double or extra doses. What may interact with this medicine? -colchicine -heavy alcohol intake This list may not describe all possible interactions. Give your health care provider a list of all the medicines, herbs, non-prescription drugs, or dietary supplements you use. Also tell them if you smoke, drink alcohol, or use illegal drugs. Some items may interact with your medicine. What should I watch for while using this medicine? Visit your doctor or health care professional regularly. You may need blood work done while you are taking this medicine. You may need to follow a special diet. Talk to your doctor. Limit your alcohol intake and avoid smoking to get the best benefit. What side effects may I notice from receiving this medicine? Side effects that you should report to your doctor or health care professional as soon as possible: -allergic reactions like skin rash, itching or hives, swelling of the face, lips, or tongue -blue tint to skin -chest tightness, pain -difficulty breathing, wheezing -dizziness -red, swollen painful area on the leg Side effects that usually do not require medical attention (report to your doctor or health care professional if they continue or are bothersome): -diarrhea -headache This list may not describe all possible side effects. Call your doctor for medical advice about side  effects. You may report side effects to FDA at 1-800-FDA-1088. Where should I keep my medicine? Keep out of the reach of children. Store at room temperature between 15 and 30 degrees C (59 and 85 degrees F). Protect from light. Throw away any unused medicine after the expiration date. NOTE: This sheet is a summary. It may not cover all possible information. If you have questions about this medicine, talk to your doctor, pharmacist, or health care provider.  2015, Elsevier/Gold Standard. (2007-07-13 22:10:20)

## 2014-06-02 NOTE — Progress Notes (Signed)
Hematology and Oncology Follow Up Visit  [name] [name] [id number] 05/26/1940 74 y.o. 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  [name] is back for followup. He actually looks better. We now have him on some Megace to help with his appetite. He is eating much better. He is in some weight.  He has not fallen lately.  I think that the testosterone injections that he is receiving are helping him.Marland Kitchen His family thinks that he is doing much better. His son comes in with him today.   He feels more energetic.   He's not complaining of any pain.   He's had no nausea or vomiting. There's been no issues with diarrhea. He may have a little bit of constipation.  His last protein studies showed a monoclonal spike of 1.01 g/dL. This is holding steady. His IgM level was 1530 mg/dL. His Kappa light chain was 27.8 mg/dL.  So far, he's had no problems with the Imbruvica.  Medications:  Current outpatient prescriptions:  .  escitalopram (LEXAPRO) 10 MG tablet, Take 1 tablet (10 mg total) by mouth daily., Disp: 30 tablet, Rfl: 6 .  fish oil-omega-3 fatty acids 1000 MG capsule, Take 1 g by mouth daily. , Disp: , Rfl:  .  ibrutinib (IMBRUVICA) 140 MG capsul, Take 3 capsules (420 mg total) by mouth daily., Disp: 90 capsule, Rfl: 3 .  megestrol (MEGACE) 400 MG/10ML suspension, Take 1 tablespoon a day for appetite and weight gain, Disp: 480 mL, Rfl: 4 .  Pyridoxine HCl (VITAMIN B-6) 250 MG tablet, Take 1 tablet (250 mg total) by mouth daily., Disp: 30 tablet, Rfl: 12 .  traMADol (ULTRAM) 50 MG tablet, Take 1 tablet (50 mg total) by mouth every 6 (six) hours as needed.,  Disp: 90 tablet, Rfl: 3 No current facility-administered medications for this visit.  Facility-Administered Medications Ordered in Other Visits:  .  0.9 %  sodium chloride infusion, , Intravenous, Continuous, Volanda Napoleon, MD, Last Rate: 20 mL/hr at 06/02/14 0931 .  cyanocobalamin ((VITAMIN B-12)) injection 1,000 mcg, 1,000 mcg, Intramuscular, Once, Volanda Napoleon, MD .  Octagam 10% SOLN 40 g, 40 g, Intravenous, Once, Volanda Napoleon, MD .  testosterone cypionate (DEPOTESTOTERONE CYPIONATE) injection 200 mg, 200 mg, Intramuscular, Q14 Days, Volanda Napoleon, MD  Allergies: No Known Allergies  Past Medical History, Surgical history, Social history, and Family History were reviewed and updated.  Review of Systems: As above  Physical Exam:  height is 5\' 8"  (1.727 m) and weight is 148 lb (67.132 kg). His oral temperature is 97.6 F (36.4 C). His blood pressure is 123/69 and his pulse is 80. His respiration is 20.   Thin, white gentleman in no obvious distress. Head and exam is no ocular or oral lesions. He has no scleral icterus. There is no mucositis. He has no adenopathy in his neck. His lungs are clear. His is cardiac exam regular rate and rhythm with no murmurs, rubs or bruits. Abdomen is soft. He has good bowel sounds. There is no fluid wave. There is no palpable liver or spleen tip. Extremities shows better muscle  tone in the upper and lower extremities. Back exam shows no tenderness over the spine, ribs or hips. Neurological exam is nonfocal. Skin exam no rashes.  Lab Results  Component Value Date   WBC 7.4 06/02/2014   HGB 15.3 06/02/2014   HCT 45.5 06/02/2014   MCV 95 06/02/2014   PLT 225 06/02/2014     Chemistry      Component Value Date/Time   NA 135 04/29/2014 0912   NA 137 04/01/2014 0820   K 3.7 04/29/2014 0912   K 3.8 04/01/2014 0820   CL 96* 04/29/2014 0912   CL 104 04/01/2014 0820   CO2 27 04/29/2014 0912   CO2 21 04/01/2014 0820   BUN 11 04/29/2014 0912    BUN 12 04/01/2014 0820   CREATININE 0.8 04/29/2014 0912   CREATININE 0.69 04/01/2014 0820      Component Value Date/Time   CALCIUM 9.2 04/29/2014 0912   CALCIUM 9.1 04/01/2014 0820   ALKPHOS 55 04/29/2014 0912   ALKPHOS 56 04/01/2014 0820   AST 14 04/29/2014 0912   AST 10 04/01/2014 0820   ALT 9* 04/29/2014 0912   ALT <8 04/01/2014 0820   BILITOT 0.70 04/29/2014 0912   BILITOT 0.3 04/01/2014 0820       Impression and Plan: [name] [name] is a 74 year old with recurrent lymphoplasmacytic lymphoma. Again, he is looking better. I think the testosterone is really helping him. We do give him iron. I'll use this also has been a factor. He clearly is not anemic.  I told the son that he really needs to be on 420 mg of Imbruvica. Currently, he is only taking 280 mg. Hopefully, getting his dose up, will improve his protein levels. I think he'll do okay with the higher dose.  We will plan to do his IVIG today.  I will plan to get him back in another 6 weeks.[name] Kirschner, MD 2/18/20169:37 AM

## 2014-06-06 ENCOUNTER — Telehealth: Payer: Self-pay | Admitting: Hematology & Oncology

## 2014-06-06 LAB — COMPREHENSIVE METABOLIC PANEL
ALBUMIN: 3.1 g/dL — AB (ref 3.5–5.2)
ALK PHOS: 56 U/L (ref 39–117)
ALT: 10 U/L (ref 0–53)
AST: 13 U/L (ref 0–37)
BILIRUBIN TOTAL: 0.4 mg/dL (ref 0.2–1.2)
BUN: 11 mg/dL (ref 6–23)
CALCIUM: 8.8 mg/dL (ref 8.4–10.5)
CO2: 24 meq/L (ref 19–32)
CREATININE: 0.8 mg/dL (ref 0.50–1.35)
Chloride: 104 mEq/L (ref 96–112)
Glucose, Bld: 161 mg/dL — ABNORMAL HIGH (ref 70–99)
Potassium: 3.8 mEq/L (ref 3.5–5.3)
SODIUM: 139 meq/L (ref 135–145)
TOTAL PROTEIN: 6.5 g/dL (ref 6.0–8.3)

## 2014-06-06 LAB — RETICULOCYTES (CHCC)
ABS Retic: 33 10*3/uL (ref 19.0–186.0)
RBC.: 4.72 MIL/uL (ref 4.22–5.81)
Retic Ct Pct: 0.7 % (ref 0.4–2.3)

## 2014-06-06 LAB — IGG, IGA, IGM
IGM, SERUM: 1660 mg/dL — AB (ref 41–251)
IgA: 11 mg/dL — ABNORMAL LOW (ref 68–379)
IgG (Immunoglobin G), Serum: 766 mg/dL (ref 650–1600)

## 2014-06-06 LAB — IFE INTERPRETATION

## 2014-06-06 LAB — PROTEIN ELECTROPHORESIS, SERUM, WITH REFLEX
ALBUMIN ELP: 42.9 % — AB (ref 55.8–66.1)
ALPHA-2-GLOBULIN: 14.3 % — AB (ref 7.1–11.8)
Alpha-1-Globulin: 8.3 % — ABNORMAL HIGH (ref 2.9–4.9)
BETA GLOBULIN: 5.9 % (ref 4.7–7.2)
Beta 2: 19.9 % — ABNORMAL HIGH (ref 3.2–6.5)
Gamma Globulin: 8.7 % — ABNORMAL LOW (ref 11.1–18.8)
M-Spike, %: 1.12 g/dL
Total Protein, Serum Electrophoresis: 6.5 g/dL (ref 6.0–8.3)

## 2014-06-06 LAB — KAPPA/LAMBDA LIGHT CHAINS
KAPPA LAMBDA RATIO: 200 — AB (ref 0.26–1.65)
Kappa free light chain: 30 mg/dL — ABNORMAL HIGH (ref 0.33–1.94)
Lambda Free Lght Chn: 0.15 mg/dL — ABNORMAL LOW (ref 0.57–2.63)

## 2014-06-06 NOTE — Telephone Encounter (Signed)
[employer] sent a letter saying as a role to monitor the appropriateness of paid medical claims and verify adherence to standard billing procedures, we request assistance with compliance review for this patient.   Please submit medical records for [facility] 01/25/2014.   Send to:  [facility]           ATTN: RETROSPECTIVE REVIEW    [id number]               [place] [address]                 (sent via mail today to this pob)                 or      [facility]          ATTN: RETROSPECTIVE REVIEW  [address], [address] [address]

## 2014-06-10 ENCOUNTER — Ambulatory Visit: Payer: Medicare Other

## 2014-06-10 ENCOUNTER — Other Ambulatory Visit: Payer: Medicare Other | Admitting: Lab

## 2014-06-23 ENCOUNTER — Encounter: Payer: Self-pay | Admitting: *Deleted

## 2014-06-24 ENCOUNTER — Ambulatory Visit (HOSPITAL_BASED_OUTPATIENT_CLINIC_OR_DEPARTMENT_OTHER): Payer: Medicare Other

## 2014-06-24 ENCOUNTER — Ambulatory Visit (HOSPITAL_BASED_OUTPATIENT_CLINIC_OR_DEPARTMENT_OTHER): Payer: Medicare Other | Admitting: Lab

## 2014-06-24 DIAGNOSIS — D510 Vitamin B12 deficiency anemia due to intrinsic factor deficiency: Secondary | ICD-10-CM

## 2014-06-24 DIAGNOSIS — C880 Waldenstrom macroglobulinemia: Secondary | ICD-10-CM

## 2014-06-24 DIAGNOSIS — E291 Testicular hypofunction: Secondary | ICD-10-CM

## 2014-06-24 DIAGNOSIS — C8330 Diffuse large B-cell lymphoma, unspecified site: Secondary | ICD-10-CM

## 2014-06-24 DIAGNOSIS — R7989 Other specified abnormal findings of blood chemistry: Secondary | ICD-10-CM

## 2014-06-24 DIAGNOSIS — D500 Iron deficiency anemia secondary to blood loss (chronic): Secondary | ICD-10-CM

## 2014-06-24 DIAGNOSIS — E349 Endocrine disorder, unspecified: Secondary | ICD-10-CM

## 2014-06-24 LAB — CMP (CANCER CENTER ONLY)
ALT(SGPT): 14 U/L (ref 10–47)
AST: 22 U/L (ref 11–38)
Albumin: 3.1 g/dL — ABNORMAL LOW (ref 3.3–5.5)
Alkaline Phosphatase: 55 U/L (ref 26–84)
BILIRUBIN TOTAL: 0.5 mg/dL (ref 0.20–1.60)
BUN: 10 mg/dL (ref 7–22)
CALCIUM: 9.6 mg/dL (ref 8.0–10.3)
CHLORIDE: 102 meq/L (ref 98–108)
CO2: 29 meq/L (ref 18–33)
CREATININE: 0.9 mg/dL (ref 0.6–1.2)
Glucose, Bld: 157 mg/dL — ABNORMAL HIGH (ref 73–118)
Potassium: 4.4 mEq/L (ref 3.3–4.7)
Sodium: 144 mEq/L (ref 128–145)
Total Protein: 7.8 g/dL (ref 6.4–8.1)

## 2014-06-24 LAB — CBC WITH DIFFERENTIAL (CANCER CENTER ONLY)
BASO#: 0.1 10*3/uL (ref 0.0–0.2)
BASO%: 0.5 % (ref 0.0–2.0)
EOS%: 0.6 % (ref 0.0–7.0)
Eosinophils Absolute: 0.1 10*3/uL (ref 0.0–0.5)
HCT: 48.2 % (ref 38.7–49.9)
HGB: 16.1 g/dL (ref 13.0–17.1)
LYMPH#: 4.5 10*3/uL — AB (ref 0.9–3.3)
LYMPH%: 38.2 % (ref 14.0–48.0)
MCH: 31.9 pg (ref 28.0–33.4)
MCHC: 33.4 g/dL (ref 32.0–35.9)
MCV: 95 fL (ref 82–98)
MONO#: 0.6 10*3/uL (ref 0.1–0.9)
MONO%: 4.9 % (ref 0.0–13.0)
NEUT%: 55.8 % (ref 40.0–80.0)
NEUTROS ABS: 6.6 10*3/uL — AB (ref 1.5–6.5)
PLATELETS: 270 10*3/uL (ref 145–400)
RBC: 5.05 10*6/uL (ref 4.20–5.70)
RDW: 13.9 % (ref 11.1–15.7)
WBC: 11.8 10*3/uL — ABNORMAL HIGH (ref 4.0–10.0)

## 2014-06-24 LAB — IRON AND TIBC CHCC
%SAT: 48 % (ref 20–55)
IRON: 133 ug/dL (ref 42–163)
TIBC: 278 ug/dL (ref 202–409)
UIBC: 145 ug/dL (ref 117–376)

## 2014-06-24 LAB — FERRITIN CHCC: FERRITIN: 276 ng/mL (ref 22–316)

## 2014-06-24 MED ORDER — TESTOSTERONE CYPIONATE 200 MG/ML IM SOLN
200.0000 mg | INTRAMUSCULAR | Status: DC
  Administered 2014-06-24: 200 mg via INTRAMUSCULAR

## 2014-06-24 MED ORDER — ACETAMINOPHEN 325 MG PO TABS
ORAL_TABLET | ORAL | Status: AC
  Filled 2014-06-24: qty 2

## 2014-06-24 MED ORDER — TESTOSTERONE CYPIONATE 200 MG/ML IM SOLN
INTRAMUSCULAR | Status: AC
  Filled 2014-06-24: qty 1

## 2014-06-24 MED ORDER — DIPHENHYDRAMINE HCL 25 MG PO CAPS
ORAL_CAPSULE | ORAL | Status: AC
  Filled 2014-06-24: qty 1

## 2014-06-24 NOTE — Patient Instructions (Signed)

## 2014-06-28 LAB — PROTEIN ELECTROPHORESIS, SERUM, WITH REFLEX
ALPHA-2-GLOBULIN: 12.2 % — AB (ref 7.1–11.8)
Albumin ELP: 44.9 % — ABNORMAL LOW (ref 55.8–66.1)
Alpha-1-Globulin: 5.6 % — ABNORMAL HIGH (ref 2.9–4.9)
Beta 2: 20.2 % — ABNORMAL HIGH (ref 3.2–6.5)
Beta Globulin: 6.5 % (ref 4.7–7.2)
GAMMA GLOBULIN: 10.6 % — AB (ref 11.1–18.8)
M-Spike, %: 1.23 g/dL
TOTAL PROTEIN, SERUM ELECTROPHOR: 6.8 g/dL (ref 6.0–8.3)

## 2014-06-28 LAB — KAPPA/LAMBDA LIGHT CHAINS
KAPPA LAMBDA RATIO: 46.72 — AB (ref 0.26–1.65)
Kappa free light chain: 27.1 mg/dL — ABNORMAL HIGH (ref 0.33–1.94)
Lambda Free Lght Chn: 0.58 mg/dL (ref 0.57–2.63)

## 2014-06-28 LAB — TESTOSTERONE: TESTOSTERONE: 251 ng/dL — AB (ref 300–890)

## 2014-06-28 LAB — IGG, IGA, IGM
IGG (IMMUNOGLOBIN G), SERUM: 1000 mg/dL (ref 650–1600)
IgA: 13 mg/dL — ABNORMAL LOW (ref 68–379)
IgM, Serum: 1640 mg/dL — ABNORMAL HIGH (ref 41–251)

## 2014-06-28 LAB — IFE INTERPRETATION

## 2014-07-14 ENCOUNTER — Other Ambulatory Visit: Payer: Self-pay | Admitting: Nurse Practitioner

## 2014-07-14 DIAGNOSIS — D510 Vitamin B12 deficiency anemia due to intrinsic factor deficiency: Secondary | ICD-10-CM

## 2014-07-14 DIAGNOSIS — C880 Waldenstrom macroglobulinemia: Secondary | ICD-10-CM

## 2014-07-15 ENCOUNTER — Ambulatory Visit (HOSPITAL_BASED_OUTPATIENT_CLINIC_OR_DEPARTMENT_OTHER): Payer: Medicare Other | Admitting: Hematology & Oncology

## 2014-07-15 ENCOUNTER — Ambulatory Visit (HOSPITAL_BASED_OUTPATIENT_CLINIC_OR_DEPARTMENT_OTHER): Payer: Medicare Other | Admitting: Nurse Practitioner

## 2014-07-15 ENCOUNTER — Encounter: Payer: Self-pay | Admitting: Hematology & Oncology

## 2014-07-15 ENCOUNTER — Ambulatory Visit (HOSPITAL_BASED_OUTPATIENT_CLINIC_OR_DEPARTMENT_OTHER): Payer: Medicare Other

## 2014-07-15 VITALS — BP 127/72 | HR 85 | Temp 97.6°F | Resp 20 | Ht 68.0 in | Wt 148.0 lb

## 2014-07-15 DIAGNOSIS — C8330 Diffuse large B-cell lymphoma, unspecified site: Secondary | ICD-10-CM

## 2014-07-15 DIAGNOSIS — D801 Nonfamilial hypogammaglobulinemia: Secondary | ICD-10-CM

## 2014-07-15 DIAGNOSIS — D510 Vitamin B12 deficiency anemia due to intrinsic factor deficiency: Secondary | ICD-10-CM

## 2014-07-15 DIAGNOSIS — D6481 Anemia due to antineoplastic chemotherapy: Secondary | ICD-10-CM

## 2014-07-15 DIAGNOSIS — E291 Testicular hypofunction: Secondary | ICD-10-CM | POA: Diagnosis not present

## 2014-07-15 DIAGNOSIS — R7989 Other specified abnormal findings of blood chemistry: Secondary | ICD-10-CM

## 2014-07-15 DIAGNOSIS — C880 Waldenstrom macroglobulinemia: Secondary | ICD-10-CM

## 2014-07-15 DIAGNOSIS — T451X5A Adverse effect of antineoplastic and immunosuppressive drugs, initial encounter: Secondary | ICD-10-CM

## 2014-07-15 LAB — COMPREHENSIVE METABOLIC PANEL (CC13)
ALK PHOS: 60 U/L (ref 40–150)
ALT: 15 U/L (ref 0–55)
ANION GAP: 12 meq/L — AB (ref 3–11)
AST: 16 U/L (ref 5–34)
Albumin: 2.7 g/dL — ABNORMAL LOW (ref 3.5–5.0)
BILIRUBIN TOTAL: 0.56 mg/dL (ref 0.20–1.20)
BUN: 9.6 mg/dL (ref 7.0–26.0)
CO2: 19 mEq/L — ABNORMAL LOW (ref 22–29)
Calcium: 9.6 mg/dL (ref 8.4–10.4)
Chloride: 104 mEq/L (ref 98–109)
Creatinine: 0.7 mg/dL (ref 0.7–1.3)
GLUCOSE: 113 mg/dL (ref 70–140)
Potassium: 4.3 mEq/L (ref 3.5–5.1)
Sodium: 135 mEq/L — ABNORMAL LOW (ref 136–145)
TOTAL PROTEIN: 7 g/dL (ref 6.4–8.3)

## 2014-07-15 LAB — CBC WITH DIFFERENTIAL (CANCER CENTER ONLY)
BASO#: 0 10*3/uL (ref 0.0–0.2)
BASO%: 0.4 % (ref 0.0–2.0)
EOS%: 0.8 % (ref 0.0–7.0)
Eosinophils Absolute: 0.1 10*3/uL (ref 0.0–0.5)
HCT: 47.2 % (ref 38.7–49.9)
HEMOGLOBIN: 16.2 g/dL (ref 13.0–17.1)
LYMPH#: 2.5 10*3/uL (ref 0.9–3.3)
LYMPH%: 31.8 % (ref 14.0–48.0)
MCH: 31.9 pg (ref 28.0–33.4)
MCHC: 34.3 g/dL (ref 32.0–35.9)
MCV: 93 fL (ref 82–98)
MONO#: 1 10*3/uL — AB (ref 0.1–0.9)
MONO%: 12.4 % (ref 0.0–13.0)
NEUT#: 4.3 10*3/uL (ref 1.5–6.5)
NEUT%: 54.6 % (ref 40.0–80.0)
Platelets: 238 10*3/uL (ref 145–400)
RBC: 5.08 10*6/uL (ref 4.20–5.70)
RDW: 14 % (ref 11.1–15.7)
WBC: 7.8 10*3/uL (ref 4.0–10.0)

## 2014-07-15 MED ORDER — CYANOCOBALAMIN 1000 MCG/ML IJ SOLN
INTRAMUSCULAR | Status: AC
  Filled 2014-07-15: qty 1

## 2014-07-15 MED ORDER — CYANOCOBALAMIN 1000 MCG/ML IJ SOLN
1000.0000 ug | Freq: Once | INTRAMUSCULAR | Status: AC
  Administered 2014-07-15: 1000 ug via INTRAMUSCULAR

## 2014-07-15 MED ORDER — TESTOSTERONE CYPIONATE 200 MG/ML IM SOLN
200.0000 mg | INTRAMUSCULAR | Status: DC
  Administered 2014-07-15: 200 mg via INTRAMUSCULAR

## 2014-07-15 MED ORDER — TESTOSTERONE CYPIONATE 200 MG/ML IM SOLN
INTRAMUSCULAR | Status: AC
  Filled 2014-07-15: qty 1

## 2014-07-15 MED ORDER — ACETAMINOPHEN 325 MG PO TABS: 650.0000 mg | ORAL_TABLET | Freq: Once | ORAL | Status: DC

## 2014-07-15 MED ORDER — DIPHENHYDRAMINE HCL 25 MG PO CAPS: 25.0000 mg | ORAL_CAPSULE | Freq: Once | ORAL | Status: DC

## 2014-07-15 MED ORDER — IMMUNE GLOBULIN (HUMAN) 20 GM/200ML IV SOLN
40.0000 g | Freq: Once | INTRAVENOUS | Status: AC
  Administered 2014-07-15: 40 g via INTRAVENOUS
  Filled 2014-07-15: qty 400

## 2014-07-15 MED ORDER — ACETAMINOPHEN 325 MG PO TABS
ORAL_TABLET | ORAL | Status: AC
  Filled 2014-07-15: qty 2

## 2014-07-15 MED ORDER — DIPHENHYDRAMINE HCL 25 MG PO CAPS
ORAL_CAPSULE | ORAL | Status: AC
  Filled 2014-07-15: qty 1

## 2014-07-15 NOTE — Patient Instructions (Signed)
Testosterone injection What is this medicine? TESTOSTERONE (tes TOS ter one) is the main male hormone. It supports normal male development such as muscle growth, facial hair, and deep voice. It is used in males to treat low testosterone levels. This medicine may be used for other purposes; ask your health care provider or pharmacist if you have questions. COMMON BRAND NAME(S): Andro-L.A., Aveed, Delatestryl, Depo-Testosterone, Virilon What should I tell my health care provider before I take this medicine? They need to know if you have any of these conditions: -breast cancer -diabetes -heart disease -kidney disease -liver disease -lung disease -prostate cancer, enlargement -an unusual or allergic reaction to testosterone, other medicines, foods, dyes, or preservatives -pregnant or trying to get pregnant -breast-feeding How should I use this medicine? This medicine is for injection into a muscle. It is usually given by a health care professional in a hospital or clinic setting. Contact your pediatrician regarding the use of this medicine in children. While this medicine may be prescribed for children as young as 12 years of age for selected conditions, precautions do apply. Overdosage: If you think you have taken too much of this medicine contact a poison control center or emergency room at once. NOTE: This medicine is only for you. Do not share this medicine with others. What if I miss a dose? Try not to miss a dose. Your doctor or health care professional will tell you when your next injection is due. Notify the office if you are unable to keep an appointment. What may interact with this medicine? -medicines for diabetes -medicines that treat or prevent blood clots like warfarin -oxyphenbutazone -propranolol -steroid medicines like prednisone or cortisone This list may not describe all possible interactions. Give your health care provider a list of all the medicines, herbs,  non-prescription drugs, or dietary supplements you use. Also tell them if you smoke, drink alcohol, or use illegal drugs. Some items may interact with your medicine. What should I watch for while using this medicine? Visit your doctor or health care professional for regular checks on your progress. They will need to check the level of testosterone in your blood. This medicine is only approved for use in men who have low levels of testosterone related to certain medical conditions. Heart attacks and strokes have been reported with the use of this medicine. Notify your doctor or health care professional and seek emergency treatment if you develop breathing problems; changes in vision; confusion; chest pain or chest tightness; sudden arm pain; severe, sudden headache; trouble speaking or understanding; sudden numbness or weakness of the face, arm or leg; loss of balance or coordination. Talk to your doctor about the risks and benefits of this medicine. This medicine may affect blood sugar levels. If you have diabetes, check with your doctor or health care professional before you change your diet or the dose of your diabetic medicine. This drug is banned from use in athletes by most athletic organizations. What side effects may I notice from receiving this medicine? Side effects that you should report to your doctor or health care professional as soon as possible: -allergic reactions like skin rash, itching or hives, swelling of the face, lips, or tongue -breast enlargement -breathing problems -changes in mood, especially anger, depression, or rage -dark urine -general ill feeling or flu-like symptoms -light-colored stools -loss of appetite, nausea -nausea, vomiting -right upper belly pain -stomach pain -swelling of ankles -too frequent or persistent erections -trouble passing urine or change in the amount of urine -unusually   weak or tired -yellowing of the eyes or skin Additional side effects  that can occur in women include: -deep or hoarse voice -facial hair growth -irregular menstrual periods Side effects that usually do not require medical attention (report to your doctor or health care professional if they continue or are bothersome): -acne -change in sex drive or performance -hair loss -headache This list may not describe all possible side effects. Call your doctor for medical advice about side effects. You may report side effects to FDA at 1-800-FDA-1088. Where should I keep my medicine? Keep out of the reach of children. This medicine can be abused. Keep your medicine in a safe place to protect it from theft. Do not share this medicine with anyone. Selling or giving away this medicine is dangerous and against the law. Store at room temperature between 20 and 25 degrees C (68 and 77 degrees F). Do not freeze. Protect from light. Follow the directions for the product you are prescribed. Throw away any unused medicine after the expiration date. NOTE: This sheet is a summary. It may not cover all possible information. If you have questions about this medicine, talk to your doctor, pharmacist, or health care provider.  2015, Elsevier/Gold Standard. (2013-06-17 69:48:54) Cyanocobalamin, Pyridoxine, and Folate What is this medicine? A multivitamin containing folic acid, vitamin B6, and vitamin B12. This medicine may be used for other purposes; ask your health care provider or pharmacist if you have questions. COMMON BRAND NAME(S): AllanFol RX, AllanTex, ComBgen, FaBB, Folamin, Folastin, [place], [place], [place], [place], [place], Folgard RX, [place] RX 2.2, [place], [place] 2.2, Foltabs 800, Foltx, Homocysteine Formula, NuFol, TL [employer], Virt-Vite, Virt-Vite [place], Vita-Respa What should I tell my health care provider before I take this medicine? They need to know if you have any of these conditions: -bleeding or clotting disorder -history of anemia of any type -other chronic  health condition -an unusual or allergic reaction to vitamins, other medicines, foods, dyes, or preservatives -pregnant or trying to get pregnant -breast-feeding How should I use this medicine? Take by mouth with a glass of water. May take with food. Follow the directions on the prescription label. It is usually given once a day. Do not take your medicine more often than directed. Contact your pediatrician regarding the use of this medicine in children. Special care may be needed. Overdosage: If you think you have taken too much of this medicine contact a poison control center or emergency room at once. NOTE: This medicine is only for you. Do not share this medicine with others. What if I miss a dose? If you miss a dose, take it as soon as you can. If it is almost time for your next dose, take only that dose. Do not take double or extra doses. What may interact with this medicine? -levodopa This list may not describe all possible interactions. Give your health care provider a list of all the medicines, herbs, non-prescription drugs, or dietary supplements you use. Also tell them if you smoke, drink alcohol, or use illegal drugs. Some items may interact with your medicine. What should I watch for while using this medicine? See your health care professional for regular checks on your progress. Remember that vitamin supplements do not replace the need for good nutrition from a balanced diet. What side effects may I notice from receiving this medicine? Side effects that you should report to your doctor or health care professional as soon as possible: -allergic reaction such as skin rash or difficulty breathing -  vomiting Side effects that usually do not require medical attention (report to your doctor or health care professional if they continue or are bothersome): -nausea -stomach upset This list may not describe all possible side effects. Call your doctor for medical advice about side effects.  You may report side effects to FDA at 1-800-FDA-1088. Where should I keep my medicine? Keep out of the reach of children. Most vitamins should be stored at controlled room temperature. Check your specific product directions. Protect from heat and moisture. Throw away any unused medicine after the expiration date. NOTE: This sheet is a summary. It may not cover all possible information. If you have questions about this medicine, talk to your doctor, pharmacist, or health care provider.  2015, Elsevier/Gold Standard. (2007-05-23 00:59:55) Immune Globulin Injection What is this medicine? IMMUNE GLOBULIN (im MUNE GLOB yoo lin) helps to prevent or reduce the severity of certain infections in patients who are at risk. This medicine is collected from the pooled blood of many donors. It is used to treat immune system problems, thrombocytopenia, and Kawasaki syndrome. This medicine may be used for other purposes; ask your health care provider or pharmacist if you have questions. COMMON BRAND NAME(S): Bay[place]am, BIVIGAM, Carimune, Carimune NF, Flebogamma, Flebogamma DIF, GamaSTAN S/D, Gamimune N, Gammagard S/D, Gammaked, Gammaplex, Gammar-P IV, Gamunex, Gamunex-C, Hizentra, Iveegam, Iveegam EN, Octagam, Panglobulin, Panglobulin NF, Polygam S/D, Privigen, Sandoglobulin, Venoglobulin-S, Vigam, Vivaglobulin What should I tell my health care provider before I take this medicine? They need to know if you have any of these conditions: - diabetes - extremely low or no immune antibodies in the blood - heart disease - history of blood clots - hyperprolinemia - infection in the blood, sepsis - kidney disease - taking medicine that may change kidney function - ask your health care provider about your medicine - an unusual or allergic reaction to human immune globulin, albumin, maltose, sucrose, polysorbate 80, other medicines, foods, dyes, or preservatives - pregnant or trying to get  pregnant - breast-feeding How should I use this medicine? This medicine is for injection into a muscle or infusion into a vein or skin. It is usually given by a health care professional in a hospital or clinic setting. In rare cases, some brands of this medicine might be given at home. You will be taught how to give this medicine. Use exactly as directed. Take your medicine at regular intervals. Do not take your medicine more often than directed. Talk to your pediatrician regarding the use of this medicine in children. Special care may be needed. Overdosage: If you think you have taken too much of this medicine contact a poison control center or emergency room at once. NOTE: This medicine is only for you. Do not share this medicine with others. What if I miss a dose? It is important not to miss your dose. Call your doctor or health care professional if you are unable to keep an appointment. If you give yourself the medicine and you miss a dose, take it as soon as you can. If it is almost time for your next dose, take only that dose. Do not take double or extra doses. What may interact with this medicine? -aspirin and aspirin-like medicines -cisplatin -cyclosporine -medicines for infection like acyclovir, adefovir, amphotericin B, bacitracin, cidofovir, foscarnet, ganciclovir, gentamicin, pentamidine, vancomycin -NSAIDS, medicines for pain and inflammation, like ibuprofen or naproxen -pamidronate -vaccines -zoledronic acid This list may not describe all possible interactions. Give your health care provider a list of all the  medicines, herbs, non-prescription drugs, or dietary supplements you use. Also tell them if you smoke, drink alcohol, or use illegal drugs. Some items may interact with your medicine. What should I watch for while using this medicine? Your condition will be monitored carefully while you are receiving this medicine. This medicine is made from pooled blood donations of many  different people. It may be possible to pass an infection in this medicine. However, the donors are screened for infections and all products are tested for HIV and hepatitis. The medicine is treated to kill most or all bacteria and viruses. Talk to your doctor about the risks and benefits of this medicine. Do not have vaccinations for at least 14 days before, or until at least 3 months after receiving this medicine. What side effects may I notice from receiving this medicine? Side effects that you should report to your doctor or health care professional as soon as possible: -allergic reactions like skin rash, itching or hives, swelling of the face, lips, or tongue -breathing problems -chest pain or tightness -fever, chills -headache with nausea, vomiting -neck pain or difficulty moving neck -pain when moving eyes -pain, swelling, warmth in the leg -problems with balance, talking, walking -sudden weight gain -swelling of the ankles, feet, hands -trouble passing urine or change in the amount of urine Side effects that usually do not require medical attention (report to your doctor or health care professional if they continue or are bothersome): -dizzy, drowsy -flushing -increased sweating -leg cramps -muscle aches and pains -pain at site where injected This list may not describe all possible side effects. Call your doctor for medical advice about side effects. You may report side effects to FDA at 1-800-FDA-1088. Where should I keep my medicine? Keep out of the reach of children. This drug is usually given in a hospital or clinic and will not be stored at home. In rare cases, some brands of this medicine may be given at home. If you are using this medicine at home, you will be instructed on how to store this medicine. Throw away any unused medicine after the expiration date on the label. NOTE: This sheet is a summary. It may not cover all possible information. If you have questions about  this medicine, talk to your doctor, pharmacist, or health care provider.  2015, Elsevier/Gold Standard. (2008-06-22 11:44:49)

## 2014-07-18 NOTE — Progress Notes (Signed)
Hematology and Oncology Follow Up Visit  [name] [name] [id number] 12/07/1940 74 y.o. 07/18/2014   Principle Diagnosis:   Lymphoplasmacytic lymphoma-recurrent  Hypogammaglobulinemia with recurrent pneumonia  Alcohol induced neuropathy and dementia       Vitamin B12 deficiency secondary to malabsorption  Current Therapy:    Imbruvica 420mg  po q day   IVIG 40 gm IV q 6weeks  B12 1mg  IM q 6 wk  Depo-Testosterone 200 mg IM every 2 weeks     Interim History:  Mr.  [name] is back for followup. He seems to be doing fairly well. He still has some episodes of falling. Again I this might be from the neuropathy that he has from past alcohol use.  He says he is taking the [place]. His monoclonal studies have been going up. Back in early March, his M spike was up to 1.23 g/dL. His IgM level was 1640 mg/dL. We will have to see what the M spike his this time. If we still see the M spike going up, then I think where going to have to make a change.  So far, he's had no problems with fever. He's had no bleeding. He's had no nausea or vomiting. He is eating pretty well.  I think the testosterone that we give him is helping.  Medications:  Current outpatient prescriptions:  .  escitalopram (LEXAPRO) 10 MG tablet, Take 1 tablet (10 mg total) by mouth daily., Disp: 30 tablet, Rfl: 6 .  fish oil-omega-3 fatty acids 1000 MG capsule, Take 1 g by mouth daily. , Disp: , Rfl:  .  ibrutinib (IMBRUVICA) 140 MG capsul, Take 3 capsules (420 mg total) by mouth daily., Disp: 90 capsule, Rfl: 3 .  megestrol (MEGACE) 400 MG/10ML suspension, Take 1 tablespoon a day for appetite and weight gain, Disp: 480 mL, Rfl: 4 .  Pyridoxine HCl (VITAMIN B-6) 250 MG tablet, Take 1 tablet (250 mg total) by mouth daily., Disp: 30 tablet, Rfl: 12 .  traMADol (ULTRAM) 50 MG tablet, Take 1 tablet (50 mg total) by mouth every 6 (six) hours as needed., Disp: 90 tablet, Rfl: 3  Allergies: No Known Allergies  Past Medical History,  Surgical history, Social history, and Family History were reviewed and updated.  Review of Systems: As above  Physical Exam:  height is 5\' 8"  (1.727 m) and weight is 148 lb (67.132 kg). His oral temperature is 97.6 F (36.4 C). His blood pressure is 127/72 and his pulse is 85. His respiration is 20.   Thin, white gentleman in no obvious distress. Head and exam is no ocular or oral lesions. He has no scleral icterus. There is no mucositis. He has no adenopathy in his neck. His lungs are clear. His cardiac exam is regular rate and rhythm with no murmurs, rubs or bruits. Abdomen is soft. He has good bowel sounds. There is no fluid wave. There is no palpable liver or spleen tip. Extremities shows better muscle tone in the upper and lower extremities. Back exam shows no tenderness over the spine, ribs or hips. Neurological exam is nonfocal. Skin exam no rashes.  Lab Results  Component Value Date   WBC 7.8 07/15/2014   HGB 16.2 07/15/2014   HCT 47.2 07/15/2014   MCV 93 07/15/2014   PLT 238 07/15/2014     Chemistry      Component Value Date/Time   NA 135* 07/15/2014 0820   NA 144 06/24/2014 0850   NA 139 06/02/2014 0828   K 4.3 07/15/2014  0820   K 4.4 06/24/2014 0850   K 3.8 06/02/2014 0828   CL 102 06/24/2014 0850   CL 104 06/02/2014 0828   CO2 19* 07/15/2014 0820   CO2 29 06/24/2014 0850   CO2 24 06/02/2014 0828   BUN 9.6 07/15/2014 0820   BUN 10 06/24/2014 0850   BUN 11 06/02/2014 0828   CREATININE 0.7 07/15/2014 0820   CREATININE 0.9 06/24/2014 0850   CREATININE 0.80 06/02/2014 0828      Component Value Date/Time   CALCIUM 9.6 07/15/2014 0820   CALCIUM 9.6 06/24/2014 0850   CALCIUM 8.8 06/02/2014 0828   ALKPHOS 60 07/15/2014 0820   ALKPHOS 55 06/24/2014 0850   ALKPHOS 56 06/02/2014 0828   AST 16 07/15/2014 0820   AST 22 06/24/2014 0850   AST 13 06/02/2014 0828   ALT 15 07/15/2014 0820   ALT 14 06/24/2014 0850   ALT 10 06/02/2014 0828   BILITOT 0.56 07/15/2014 0820    BILITOT 0.50 06/24/2014 0850   BILITOT 0.4 06/02/2014 0828       Impression and Plan: [name] [name] is a 74 year old white male with recurrent lymphoplasmacytic lymphoma. Again, he is holding pretty stable. I think the testosterone is really helping him.    It will be very interested to see what his IgM level and monoclonal spike are today. We did increase his Imbruvica dose up to 420 mg. Again, he says that he is taking this.  We will plan to do his IVIG today.  I will plan to get him back in another 6 weeks.[name], MD 4/4/20167:05 AM

## 2014-07-19 LAB — PROTEIN ELECTROPHORESIS, SERUM, WITH REFLEX
ABNORMAL PROTEIN BAND1: 1 g/dL
ALBUMIN ELP: 2.9 g/dL — AB (ref 3.8–4.8)
Alpha-1-Globulin: 0.6 g/dL — ABNORMAL HIGH (ref 0.2–0.3)
Alpha-2-Globulin: 1 g/dL — ABNORMAL HIGH (ref 0.5–0.9)
BETA 2: 1.1 g/dL — AB (ref 0.2–0.5)
BETA GLOBULIN: 0.4 g/dL (ref 0.4–0.6)
GAMMA GLOBULIN: 0.5 g/dL — AB (ref 0.8–1.7)
Total Protein, Serum Electrophoresis: 6.5 g/dL (ref 6.1–8.1)

## 2014-07-19 LAB — KAPPA/LAMBDA LIGHT CHAINS
KAPPA FREE LGHT CHN: 17.3 mg/dL — AB (ref 0.33–1.94)
KAPPA LAMBDA RATIO: 30.35 — AB (ref 0.26–1.65)
Lambda Free Lght Chn: 0.57 mg/dL (ref 0.57–2.63)

## 2014-07-19 LAB — IGG, IGA, IGM
IGG (IMMUNOGLOBIN G), SERUM: 665 mg/dL (ref 650–1600)
IgA: 11 mg/dL — ABNORMAL LOW (ref 68–379)
IgM, Serum: 1250 mg/dL — ABNORMAL HIGH (ref 41–251)

## 2014-07-19 LAB — IFE INTERPRETATION

## 2014-07-19 LAB — TESTOSTERONE: Testosterone: 168 ng/dL — ABNORMAL LOW (ref 300–890)

## 2014-07-20 ENCOUNTER — Telehealth: Payer: Self-pay | Admitting: Nurse Practitioner

## 2014-07-20 NOTE — Telephone Encounter (Addendum)
LVM on Darby's personal cell voicemail as requested.    ----- Message from Volanda Napoleon, MD sent at 07/20/2014  9:04 AM EDT ----- Call his son and telll him to keep Mr. Schnitker on the Imbruvica at 3 pills a day!![name] [name]

## 2014-08-05 ENCOUNTER — Ambulatory Visit: Payer: Medicare Other

## 2014-08-10 ENCOUNTER — Other Ambulatory Visit: Payer: Self-pay | Admitting: Nurse Practitioner

## 2014-08-10 DIAGNOSIS — D510 Vitamin B12 deficiency anemia due to intrinsic factor deficiency: Secondary | ICD-10-CM

## 2014-08-10 DIAGNOSIS — D6481 Anemia due to antineoplastic chemotherapy: Secondary | ICD-10-CM

## 2014-08-10 DIAGNOSIS — C880 Waldenstrom macroglobulinemia: Secondary | ICD-10-CM

## 2014-08-10 DIAGNOSIS — R7989 Other specified abnormal findings of blood chemistry: Secondary | ICD-10-CM

## 2014-08-10 DIAGNOSIS — T451X5A Adverse effect of antineoplastic and immunosuppressive drugs, initial encounter: Secondary | ICD-10-CM

## 2014-08-10 MED ORDER — IBRUTINIB 140 MG PO CAPS: 420.0000 mg | ORAL_CAPSULE | Freq: Every day | ORAL | Status: DC

## 2014-08-10 MED ORDER — IBRUTINIB 140 MG PO CAPS: 420.0000 mg | ORAL_CAPSULE | Freq: Every day | ORAL | Status: AC

## 2014-08-26 ENCOUNTER — Encounter: Payer: Self-pay | Admitting: Family

## 2014-08-26 ENCOUNTER — Ambulatory Visit (HOSPITAL_BASED_OUTPATIENT_CLINIC_OR_DEPARTMENT_OTHER): Payer: Medicare Other | Admitting: Family

## 2014-08-26 ENCOUNTER — Ambulatory Visit (HOSPITAL_BASED_OUTPATIENT_CLINIC_OR_DEPARTMENT_OTHER): Payer: Medicare Other

## 2014-08-26 VITALS — BP 138/70 | HR 75 | Temp 97.8°F | Resp 18

## 2014-08-26 VITALS — BP 130/77 | HR 88 | Temp 98.4°F | Resp 20 | Ht 68.0 in | Wt 145.0 lb

## 2014-08-26 DIAGNOSIS — C8800 Waldenstrom macroglobulinemia not having achieved remission: Secondary | ICD-10-CM

## 2014-08-26 DIAGNOSIS — C8300 Small cell B-cell lymphoma, unspecified site: Secondary | ICD-10-CM

## 2014-08-26 DIAGNOSIS — D510 Vitamin B12 deficiency anemia due to intrinsic factor deficiency: Secondary | ICD-10-CM

## 2014-08-26 DIAGNOSIS — R7989 Other specified abnormal findings of blood chemistry: Secondary | ICD-10-CM

## 2014-08-26 DIAGNOSIS — K909 Intestinal malabsorption, unspecified: Secondary | ICD-10-CM

## 2014-08-26 DIAGNOSIS — T451X5A Adverse effect of antineoplastic and immunosuppressive drugs, initial encounter: Secondary | ICD-10-CM

## 2014-08-26 DIAGNOSIS — R269 Unspecified abnormalities of gait and mobility: Secondary | ICD-10-CM | POA: Diagnosis not present

## 2014-08-26 DIAGNOSIS — E291 Testicular hypofunction: Secondary | ICD-10-CM | POA: Diagnosis not present

## 2014-08-26 DIAGNOSIS — D801 Nonfamilial hypogammaglobulinemia: Secondary | ICD-10-CM

## 2014-08-26 DIAGNOSIS — D6481 Anemia due to antineoplastic chemotherapy: Secondary | ICD-10-CM

## 2014-08-26 DIAGNOSIS — K9089 Other intestinal malabsorption: Secondary | ICD-10-CM

## 2014-08-26 LAB — FERRITIN CHCC: Ferritin: 1963 ng/ml — ABNORMAL HIGH (ref 22–316)

## 2014-08-26 LAB — CBC WITH DIFFERENTIAL (CANCER CENTER ONLY)
BASO#: 0.1 10*3/uL (ref 0.0–0.2)
BASO%: 0.4 % (ref 0.0–2.0)
EOS%: 0.3 % (ref 0.0–7.0)
Eosinophils Absolute: 0 10*3/uL (ref 0.0–0.5)
HCT: 48 % (ref 38.7–49.9)
HEMOGLOBIN: 16.7 g/dL (ref 13.0–17.1)
LYMPH#: 3.2 10*3/uL (ref 0.9–3.3)
LYMPH%: 23.3 % (ref 14.0–48.0)
MCH: 32.5 pg (ref 28.0–33.4)
MCHC: 34.8 g/dL (ref 32.0–35.9)
MCV: 93 fL (ref 82–98)
MONO#: 1.2 10*3/uL — ABNORMAL HIGH (ref 0.1–0.9)
MONO%: 8.7 % (ref 0.0–13.0)
NEUT#: 9.3 10*3/uL — ABNORMAL HIGH (ref 1.5–6.5)
NEUT%: 67.3 % (ref 40.0–80.0)
Platelets: 284 10*3/uL (ref 145–400)
RBC: 5.14 10*6/uL (ref 4.20–5.70)
RDW: 14.3 % (ref 11.1–15.7)
WBC: 13.9 10*3/uL — ABNORMAL HIGH (ref 4.0–10.0)

## 2014-08-26 LAB — CMP (CANCER CENTER ONLY)
ALK PHOS: 55 U/L (ref 26–84)
ALT(SGPT): 19 U/L (ref 10–47)
AST: 24 U/L (ref 11–38)
Albumin: 3.8 g/dL (ref 3.3–5.5)
BUN, Bld: 16 mg/dL (ref 7–22)
CHLORIDE: 104 meq/L (ref 98–108)
CO2: 27 meq/L (ref 18–33)
CREATININE: 0.9 mg/dL (ref 0.6–1.2)
Calcium: 9.7 mg/dL (ref 8.0–10.3)
GLUCOSE: 100 mg/dL (ref 73–118)
Potassium: 4.2 mEq/L (ref 3.3–4.7)
Sodium: 138 mEq/L (ref 128–145)
TOTAL PROTEIN: 7.4 g/dL (ref 6.4–8.1)
Total Bilirubin: 1 mg/dl (ref 0.20–1.60)

## 2014-08-26 LAB — IRON AND TIBC CHCC
%SAT: 40 % (ref 20–55)
Iron: 114 ug/dL (ref 42–163)
TIBC: 285 ug/dL (ref 202–409)
UIBC: 172 ug/dL (ref 117–376)

## 2014-08-26 MED ORDER — TESTOSTERONE CYPIONATE 200 MG/ML IM SOLN
INTRAMUSCULAR | Status: AC
  Filled 2014-08-26: qty 1

## 2014-08-26 MED ORDER — ACETAMINOPHEN 325 MG PO TABS
ORAL_TABLET | ORAL | Status: AC
  Filled 2014-08-26: qty 2

## 2014-08-26 MED ORDER — DIPHENHYDRAMINE HCL 25 MG PO CAPS
ORAL_CAPSULE | ORAL | Status: AC
  Filled 2014-08-26: qty 1

## 2014-08-26 MED ORDER — TESTOSTERONE CYPIONATE 200 MG/ML IM SOLN
200.0000 mg | INTRAMUSCULAR | Status: DC
  Administered 2014-08-26: 200 mg via INTRAMUSCULAR

## 2014-08-26 MED ORDER — IMMUNE GLOBULIN (HUMAN) 20 GM/200ML IV SOLN
40.0000 g | Freq: Once | INTRAVENOUS | Status: AC
  Administered 2014-08-26: 40 g via INTRAVENOUS
  Filled 2014-08-26: qty 400

## 2014-08-26 MED ORDER — DIPHENHYDRAMINE HCL 25 MG PO CAPS
25.0000 mg | ORAL_CAPSULE | Freq: Once | ORAL | Status: AC
Start: 2014-08-26 — End: 2014-08-26
  Administered 2014-08-26: 25 mg via ORAL

## 2014-08-26 MED ORDER — CYANOCOBALAMIN 1000 MCG/ML IJ SOLN
INTRAMUSCULAR | Status: AC
  Filled 2014-08-26: qty 1

## 2014-08-26 MED ORDER — ACETAMINOPHEN 325 MG PO TABS
650.0000 mg | ORAL_TABLET | Freq: Once | ORAL | Status: AC
  Administered 2014-08-26: 650 mg via ORAL

## 2014-08-26 MED ORDER — IMMUNE GLOBULIN (HUMAN) 5 GM/100ML IV SOLN
40.0000 g | Freq: Once | INTRAVENOUS | Status: DC
  Filled 2014-08-26: qty 800

## 2014-08-26 MED ORDER — CYANOCOBALAMIN 1000 MCG/ML IJ SOLN
1000.0000 ug | Freq: Once | INTRAMUSCULAR | Status: AC
  Administered 2014-08-26: 1000 ug via INTRAMUSCULAR

## 2014-08-26 NOTE — Patient Instructions (Signed)

## 2014-08-26 NOTE — Progress Notes (Signed)
Hematology and Oncology Follow Up Visit  [name] [name] [id number] [birth year] 74 y.o. 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 He fell again this morning and is a little sore. He has alcohol induced dementia and neuropathy. This has significantly effected his gait. He receives a B 12 injection every 6 weeks to help with this. He is using a cane to ambulate. He still has a productive cough. His lungs sound clear. He has some SOB with exertion due in part to COPD. He has had no fever or chills.  He denies n/v, rash, dizziness, chest pain, palpitations, abdominal pain, constipation, diarrhea blood in urine or stool.  No swelling, tenderness, numbness or tingling in his extremities. His appetite is ok and he is drinking some fluids. He states that he is taking his Imbruvica daily. In April, his M-spike was 1.0 g/dl. His IgM level was 1250 at that time.  His testosterone was 168. He will conting to get Depo-Testosterone every 2 weeks.    Medications:    Medication List       This list is accurate as of: 08/26/14  8:46 AM.  Always use your most recent med list.               escitalopram 10 MG tablet  Commonly known as:  LEXAPRO  Take 1 tablet (10 mg total) by mouth daily.     fish oil-omega-3 fatty acids 1000 MG capsule  Take 1 g by mouth daily.     ibrutinib 140 MG capsul  Commonly known as:  IMBRUVICA  Take 3 capsules (420 mg total) by mouth daily.     megestrol 400 MG/10ML suspension  Commonly known as:  MEGACE  Take 1 tablespoon a day for appetite and weight gain     traMADol 50 MG tablet  Commonly known as:   ULTRAM  Take 1 tablet (50 mg total) by mouth every 6 (six) hours as needed.     vitamin B-6 250 MG tablet  Take 1 tablet (250 mg total) by mouth daily.        Allergies: No Known Allergies  Past Medical History, Surgical history, Social history, and Family History were reviewed and updated.  Review of Systems: All other 10 point review of systems is negative.   Physical Exam:  vitals were not taken for this visit.  Wt Readings from Last 3 Encounters:  07/15/14 148 lb (67.132 kg)  06/02/14 148 lb (67.132 kg)  04/29/14 143 lb (64.864 kg)    Ocular: Sclerae unicteric, pupils equal, round and reactive to light Ear-nose-throat: Oropharynx clear, dentition fair Lymphatic: No cervical or supraclavicular adenopathy Lungs no rales or rhonchi, good excursion bilaterally Heart regular rate and rhythm, no murmur appreciated Abd soft, nontender, positive bowel sounds MSK no focal spinal tenderness, no joint edema Neuro: non-focal, well-oriented, appropriate affect  Lab Results  Component Value Date   WBC 13.9* 08/26/2014   HGB 16.7 08/26/2014   HCT 48.0 08/26/2014   MCV 93 08/26/2014   PLT 284 08/26/2014   Lab Results  Component Value Date   FERRITIN 276 06/24/2014   IRON 133 06/24/2014  TIBC 278 06/24/2014   UIBC 145 06/24/2014   IRONPCTSAT 48 06/24/2014   Lab Results  Component Value Date   RETICCTPCT 0.7 06/02/2014   RBC 5.14 08/26/2014   RETICCTABS 33.0 06/02/2014   Lab Results  Component Value Date   KPAFRELGTCHN 17.30* 07/15/2014   LAMBDASER 0.57 07/15/2014   KAPLAMBRATIO 30.35* 07/15/2014   Lab Results  Component Value Date   IGGSERUM 665 07/15/2014   IGA 11* 07/15/2014   IGMSERUM 1250* 07/15/2014   Lab Results  Component Value Date   TOTALPROTELP 6.5 07/15/2014   ALBUMINELP 2.9* 07/15/2014   A1GS 0.6* 07/15/2014   A2GS 1.0* 07/15/2014   BETS 0.4 07/15/2014   BETA2SER 1.1* 07/15/2014   GAMS 0.5* 07/15/2014   MSPIKE 1.23 06/24/2014   SPEI *  07/15/2014     Chemistry      Component Value Date/Time   NA 135* 07/15/2014 0820   NA 144 06/24/2014 0850   NA 139 06/02/2014 0828   K 4.3 07/15/2014 0820   K 4.4 06/24/2014 0850   K 3.8 06/02/2014 0828   CL 102 06/24/2014 0850   CL 104 06/02/2014 0828   CO2 19* 07/15/2014 0820   CO2 29 06/24/2014 0850   CO2 24 06/02/2014 0828   BUN 9.6 07/15/2014 0820   BUN 10 06/24/2014 0850   BUN 11 06/02/2014 0828   CREATININE 0.7 07/15/2014 0820   CREATININE 0.9 06/24/2014 0850   CREATININE 0.80 06/02/2014 0828      Component Value Date/Time   CALCIUM 9.6 07/15/2014 0820   CALCIUM 9.6 06/24/2014 0850   CALCIUM 8.8 06/02/2014 0828   ALKPHOS 60 07/15/2014 0820   ALKPHOS 55 06/24/2014 0850   ALKPHOS 56 06/02/2014 0828   AST 16 07/15/2014 0820   AST 22 06/24/2014 0850   AST 13 06/02/2014 0828   ALT 15 07/15/2014 0820   ALT 14 06/24/2014 0850   ALT 10 06/02/2014 0828   BILITOT 0.56 07/15/2014 0820   BILITOT 0.50 06/24/2014 0850   BILITOT 0.4 06/02/2014 0828     Impression and Plan: [name] [name] is a 74 year old white male with recurrent lymphoplasmacytic lymphoma and hypogammaglobulinemia.  He is doing well on Imbruvica. The testosterone also seems to be helping him.  His M spike in April was 1.0. We will see what his lab work today shows. He will get IVIG today as well as B 12 and Depo-testosterone.  The IVIG infusion and B12 injections will continue to be every 6 weeks and the testosterone injections every 2 weeks.  We will see him back in 6 weeks for labs and follow-up. He or his sons will contact us with any questions or concerns. We can certainly see him sooner if need be.   Eliezer Bottom, NP 5/13/20168:46 AM

## 2014-08-30 ENCOUNTER — Telehealth: Payer: Self-pay | Admitting: *Deleted

## 2014-08-30 LAB — PROTEIN ELECTROPHORESIS, SERUM, WITH REFLEX
ABNORMAL PROTEIN BAND1: 0.6 g/dL
Albumin ELP: 3.4 g/dL — ABNORMAL LOW (ref 3.8–4.8)
Alpha-1-Globulin: 0.4 g/dL — ABNORMAL HIGH (ref 0.2–0.3)
Alpha-2-Globulin: 0.9 g/dL (ref 0.5–0.9)
Beta 2: 0.9 g/dL — ABNORMAL HIGH (ref 0.2–0.5)
Beta Globulin: 0.4 g/dL (ref 0.4–0.6)
GAMMA GLOBULIN: 0.6 g/dL — AB (ref 0.8–1.7)
Total Protein, Serum Electrophoresis: 6.6 g/dL (ref 6.1–8.1)

## 2014-08-30 LAB — KAPPA/LAMBDA LIGHT CHAINS
Kappa free light chain: 10.8 mg/dL — ABNORMAL HIGH (ref 0.33–1.94)
Kappa:Lambda Ratio: 60 — ABNORMAL HIGH (ref 0.26–1.65)
Lambda Free Lght Chn: 0.18 mg/dL — ABNORMAL LOW (ref 0.57–2.63)

## 2014-08-30 LAB — IFE INTERPRETATION

## 2014-08-30 LAB — TESTOSTERONE: TESTOSTERONE: 126 ng/dL — AB (ref 300–890)

## 2014-08-30 LAB — IGG, IGA, IGM
IGA: 13 mg/dL — AB (ref 68–379)
IGM, SERUM: 1090 mg/dL — AB (ref 41–251)
IgG (Immunoglobin G), Serum: 793 mg/dL (ref 650–1600)

## 2014-08-30 NOTE — Telephone Encounter (Addendum)
Left message on personal voice mail.  ----- Message from Volanda Napoleon, MD sent at 08/30/2014  1:00 PM EDT ----- Call his son and tell him that the protein levels are getting better. Thanks

## 2014-09-09 ENCOUNTER — Ambulatory Visit: Payer: Medicare Other

## 2014-09-14 ENCOUNTER — Other Ambulatory Visit: Payer: Self-pay | Admitting: *Deleted

## 2014-09-14 DIAGNOSIS — C880 Waldenstrom macroglobulinemia: Secondary | ICD-10-CM

## 2014-09-14 DIAGNOSIS — R64 Cachexia: Secondary | ICD-10-CM

## 2014-09-14 DIAGNOSIS — D510 Vitamin B12 deficiency anemia due to intrinsic factor deficiency: Secondary | ICD-10-CM

## 2014-09-14 MED ORDER — MEGESTROL ACETATE 400 MG/10ML PO SUSP: ORAL | Status: AC

## 2014-09-23 ENCOUNTER — Ambulatory Visit (HOSPITAL_BASED_OUTPATIENT_CLINIC_OR_DEPARTMENT_OTHER): Payer: Medicare Other

## 2014-09-23 VITALS — BP 141/81 | HR 81 | Temp 97.9°F | Resp 18

## 2014-09-23 DIAGNOSIS — R7989 Other specified abnormal findings of blood chemistry: Secondary | ICD-10-CM

## 2014-09-23 DIAGNOSIS — C880 Waldenstrom macroglobulinemia: Secondary | ICD-10-CM

## 2014-09-23 DIAGNOSIS — E291 Testicular hypofunction: Secondary | ICD-10-CM

## 2014-09-23 DIAGNOSIS — D510 Vitamin B12 deficiency anemia due to intrinsic factor deficiency: Secondary | ICD-10-CM

## 2014-09-23 MED ORDER — TESTOSTERONE CYPIONATE 200 MG/ML IM SOLN
200.0000 mg | INTRAMUSCULAR | Status: DC
  Administered 2014-09-23: 200 mg via INTRAMUSCULAR

## 2014-09-23 MED ORDER — TESTOSTERONE CYPIONATE 200 MG/ML IM SOLN
INTRAMUSCULAR | Status: AC
  Filled 2014-09-23: qty 1

## 2014-10-07 ENCOUNTER — Ambulatory Visit (HOSPITAL_BASED_OUTPATIENT_CLINIC_OR_DEPARTMENT_OTHER): Payer: Medicare Other

## 2014-10-07 DIAGNOSIS — E291 Testicular hypofunction: Secondary | ICD-10-CM

## 2014-10-07 DIAGNOSIS — T451X5A Adverse effect of antineoplastic and immunosuppressive drugs, initial encounter: Secondary | ICD-10-CM

## 2014-10-07 DIAGNOSIS — R7989 Other specified abnormal findings of blood chemistry: Secondary | ICD-10-CM

## 2014-10-07 DIAGNOSIS — D510 Vitamin B12 deficiency anemia due to intrinsic factor deficiency: Secondary | ICD-10-CM

## 2014-10-07 DIAGNOSIS — C880 Waldenstrom macroglobulinemia: Secondary | ICD-10-CM

## 2014-10-07 DIAGNOSIS — D6481 Anemia due to antineoplastic chemotherapy: Secondary | ICD-10-CM

## 2014-10-07 MED ORDER — TESTOSTERONE CYPIONATE 200 MG/ML IM SOLN
INTRAMUSCULAR | Status: AC
  Filled 2014-10-07: qty 1

## 2014-10-07 MED ORDER — CYANOCOBALAMIN 1000 MCG/ML IJ SOLN
1000.0000 ug | Freq: Once | INTRAMUSCULAR | Status: AC
  Administered 2014-10-07: 1000 ug via INTRAMUSCULAR

## 2014-10-07 MED ORDER — CYANOCOBALAMIN 1000 MCG/ML IJ SOLN
INTRAMUSCULAR | Status: AC
  Filled 2014-10-07: qty 1

## 2014-10-07 MED ORDER — TESTOSTERONE CYPIONATE 200 MG/ML IM SOLN
200.0000 mg | INTRAMUSCULAR | Status: DC
  Administered 2014-10-07: 200 mg via INTRAMUSCULAR

## 2014-10-07 NOTE — Patient Instructions (Signed)
Cyanocobalamin, Vitamin B12 injection What is this medicine? CYANOCOBALAMIN (sye an oh koe BAL a min) is a man made form of vitamin B12. Vitamin B12 is used in the growth of healthy blood cells, nerve cells, and proteins in the body. It also helps with the metabolism of fats and carbohydrates. This medicine is used to treat people who can not absorb vitamin B12. This medicine may be used for other purposes; ask your health care provider or pharmacist if you have questions. COMMON BRAND NAME(S): Cyomin, LA-12, Nutri-Twelve, Primabalt What should I tell my health care provider before I take this medicine? They need to know if you have any of these conditions: -kidney disease -Leber's disease -megaloblastic anemia -an unusual or allergic reaction to cyanocobalamin, cobalt, other medicines, foods, dyes, or preservatives -pregnant or trying to get pregnant -breast-feeding How should I use this medicine? This medicine is injected into a muscle or deeply under the skin. It is usually given by a health care professional in a clinic or doctor's office. However, your doctor may teach you how to inject yourself. Follow all instructions. Talk to your pediatrician regarding the use of this medicine in children. Special care may be needed. Overdosage: If you think you have taken too much of this medicine contact a poison control center or emergency room at once. NOTE: This medicine is only for you. Do not share this medicine with others. What if I miss a dose? If you are given your dose at a clinic or doctor's office, call to reschedule your appointment. If you give your own injections and you miss a dose, take it as soon as you can. If it is almost time for your next dose, take only that dose. Do not take double or extra doses. What may interact with this medicine? -colchicine -heavy alcohol intake This list may not describe all possible interactions. Give your health care provider a list of all the  medicines, herbs, non-prescription drugs, or dietary supplements you use. Also tell them if you smoke, drink alcohol, or use illegal drugs. Some items may interact with your medicine. What should I watch for while using this medicine? Visit your doctor or health care professional regularly. You may need blood work done while you are taking this medicine. You may need to follow a special diet. Talk to your doctor. Limit your alcohol intake and avoid smoking to get the best benefit. What side effects may I notice from receiving this medicine? Side effects that you should report to your doctor or health care professional as soon as possible: -allergic reactions like skin rash, itching or hives, swelling of the face, lips, or tongue -blue tint to skin -chest tightness, pain -difficulty breathing, wheezing -dizziness -red, swollen painful area on the leg Side effects that usually do not require medical attention (report to your doctor or health care professional if they continue or are bothersome): -diarrhea -headache This list may not describe all possible side effects. Call your doctor for medical advice about side effects. You may report side effects to FDA at 1-800-FDA-1088. Where should I keep my medicine? Keep out of the reach of children. Store at room temperature between 15 and 30 degrees C (59 and 85 degrees F). Protect from light. Throw away any unused medicine after the expiration date. NOTE: This sheet is a summary. It may not cover all possible information. If you have questions about this medicine, talk to your doctor, pharmacist, or health care provider.  2015, [employer] Standard. (2007-07-13 22:10:20)  Testosterone injection What is this medicine? TESTOSTERONE (tes TOS ter one) is the main male hormone. It supports normal male development such as muscle growth, facial hair, and deep voice. It is used in males to treat low testosterone levels. This medicine may be used for  other purposes; ask your health care provider or pharmacist if you have questions. COMMON BRAND NAME(S): Andro-L.A., Aveed, Delatestryl, Depo-Testosterone, Virilon What should I tell my health care provider before I take this medicine? They need to know if you have any of these conditions: -breast cancer -diabetes -heart disease -kidney disease -liver disease -lung disease -prostate cancer, enlargement -an unusual or allergic reaction to testosterone, other medicines, foods, dyes, or preservatives -pregnant or trying to get pregnant -breast-feeding How should I use this medicine? This medicine is for injection into a muscle. It is usually given by a health care professional in a hospital or clinic setting. Contact your pediatrician regarding the use of this medicine in children. While this medicine may be prescribed for children as young as 48 years of age for selected conditions, precautions do apply. Overdosage: If you think you have taken too much of this medicine contact a poison control center or emergency room at once. NOTE: This medicine is only for you. Do not share this medicine with others. What if I miss a dose? Try not to miss a dose. Your doctor or health care professional will tell you when your next injection is due. Notify the office if you are unable to keep an appointment. What may interact with this medicine? -medicines for diabetes -medicines that treat or prevent blood clots like warfarin -oxyphenbutazone -propranolol -steroid medicines like prednisone or cortisone This list may not describe all possible interactions. Give your health care provider a list of all the medicines, herbs, non-prescription drugs, or dietary supplements you use. Also tell them if you smoke, drink alcohol, or use illegal drugs. Some items may interact with your medicine. What should I watch for while using this medicine? Visit your doctor or health care professional for regular checks on  your progress. They will need to check the level of testosterone in your blood. This medicine is only approved for use in men who have low levels of testosterone related to certain medical conditions. Heart attacks and strokes have been reported with the use of this medicine. Notify your doctor or health care professional and seek emergency treatment if you develop breathing problems; changes in vision; confusion; chest pain or chest tightness; sudden arm pain; severe, sudden headache; trouble speaking or understanding; sudden numbness or weakness of the face, arm or leg; loss of balance or coordination. Talk to your doctor about the risks and benefits of this medicine. This medicine may affect blood sugar levels. If you have diabetes, check with your doctor or health care professional before you change your diet or the dose of your diabetic medicine. This drug is banned from use in athletes by most athletic organizations. What side effects may I notice from receiving this medicine? Side effects that you should report to your doctor or health care professional as soon as possible: -allergic reactions like skin rash, itching or hives, swelling of the face, lips, or tongue -breast enlargement -breathing problems -changes in mood, especially anger, depression, or rage -dark urine -general ill feeling or flu-like symptoms -light-colored stools -loss of appetite, nausea -nausea, vomiting -right upper belly pain -stomach pain -swelling of ankles -too frequent or persistent erections -trouble passing urine or change in the amount of urine -  unusually weak or tired -yellowing of the eyes or skin Additional side effects that can occur in women include: -deep or hoarse voice -facial hair growth -irregular menstrual periods Side effects that usually do not require medical attention (report to your doctor or health care professional if they continue or are bothersome): -acne -change in sex drive or  performance -hair loss -headache This list may not describe all possible side effects. Call your doctor for medical advice about side effects. You may report side effects to FDA at 1-800-FDA-1088. Where should I keep my medicine? Keep out of the reach of children. This medicine can be abused. Keep your medicine in a safe place to protect it from theft. Do not share this medicine with anyone. Selling or giving away this medicine is dangerous and against the law. Store at room temperature between 20 and 25 degrees C (68 and 77 degrees F). Do not freeze. Protect from light. Follow the directions for the product you are prescribed. Throw away any unused medicine after the expiration date. NOTE: This sheet is a summary. It may not cover all possible information. If you have questions about this medicine, talk to your doctor, pharmacist, or health care provider.  2015, [employer] Standard. (2013-06-17 87:86:76)

## 2014-10-11 ENCOUNTER — Other Ambulatory Visit: Payer: Medicare Other

## 2014-10-11 ENCOUNTER — Ambulatory Visit: Payer: Medicare Other

## 2014-10-11 ENCOUNTER — Ambulatory Visit: Payer: Medicare Other | Admitting: Hematology & Oncology

## 2014-10-14 DEATH — deceased

## 2014-11-08 IMAGING — CR DG CHEST 2V
2 series · 2 of 2 positions shown · non-contrast
Comparison: PA and lateral chest x-ray April 23, 2013.

CLINICAL DATA: Productive cough; history of lymphoma and COPD.

EXAM:
CHEST  2 VIEW

[w chest pa]
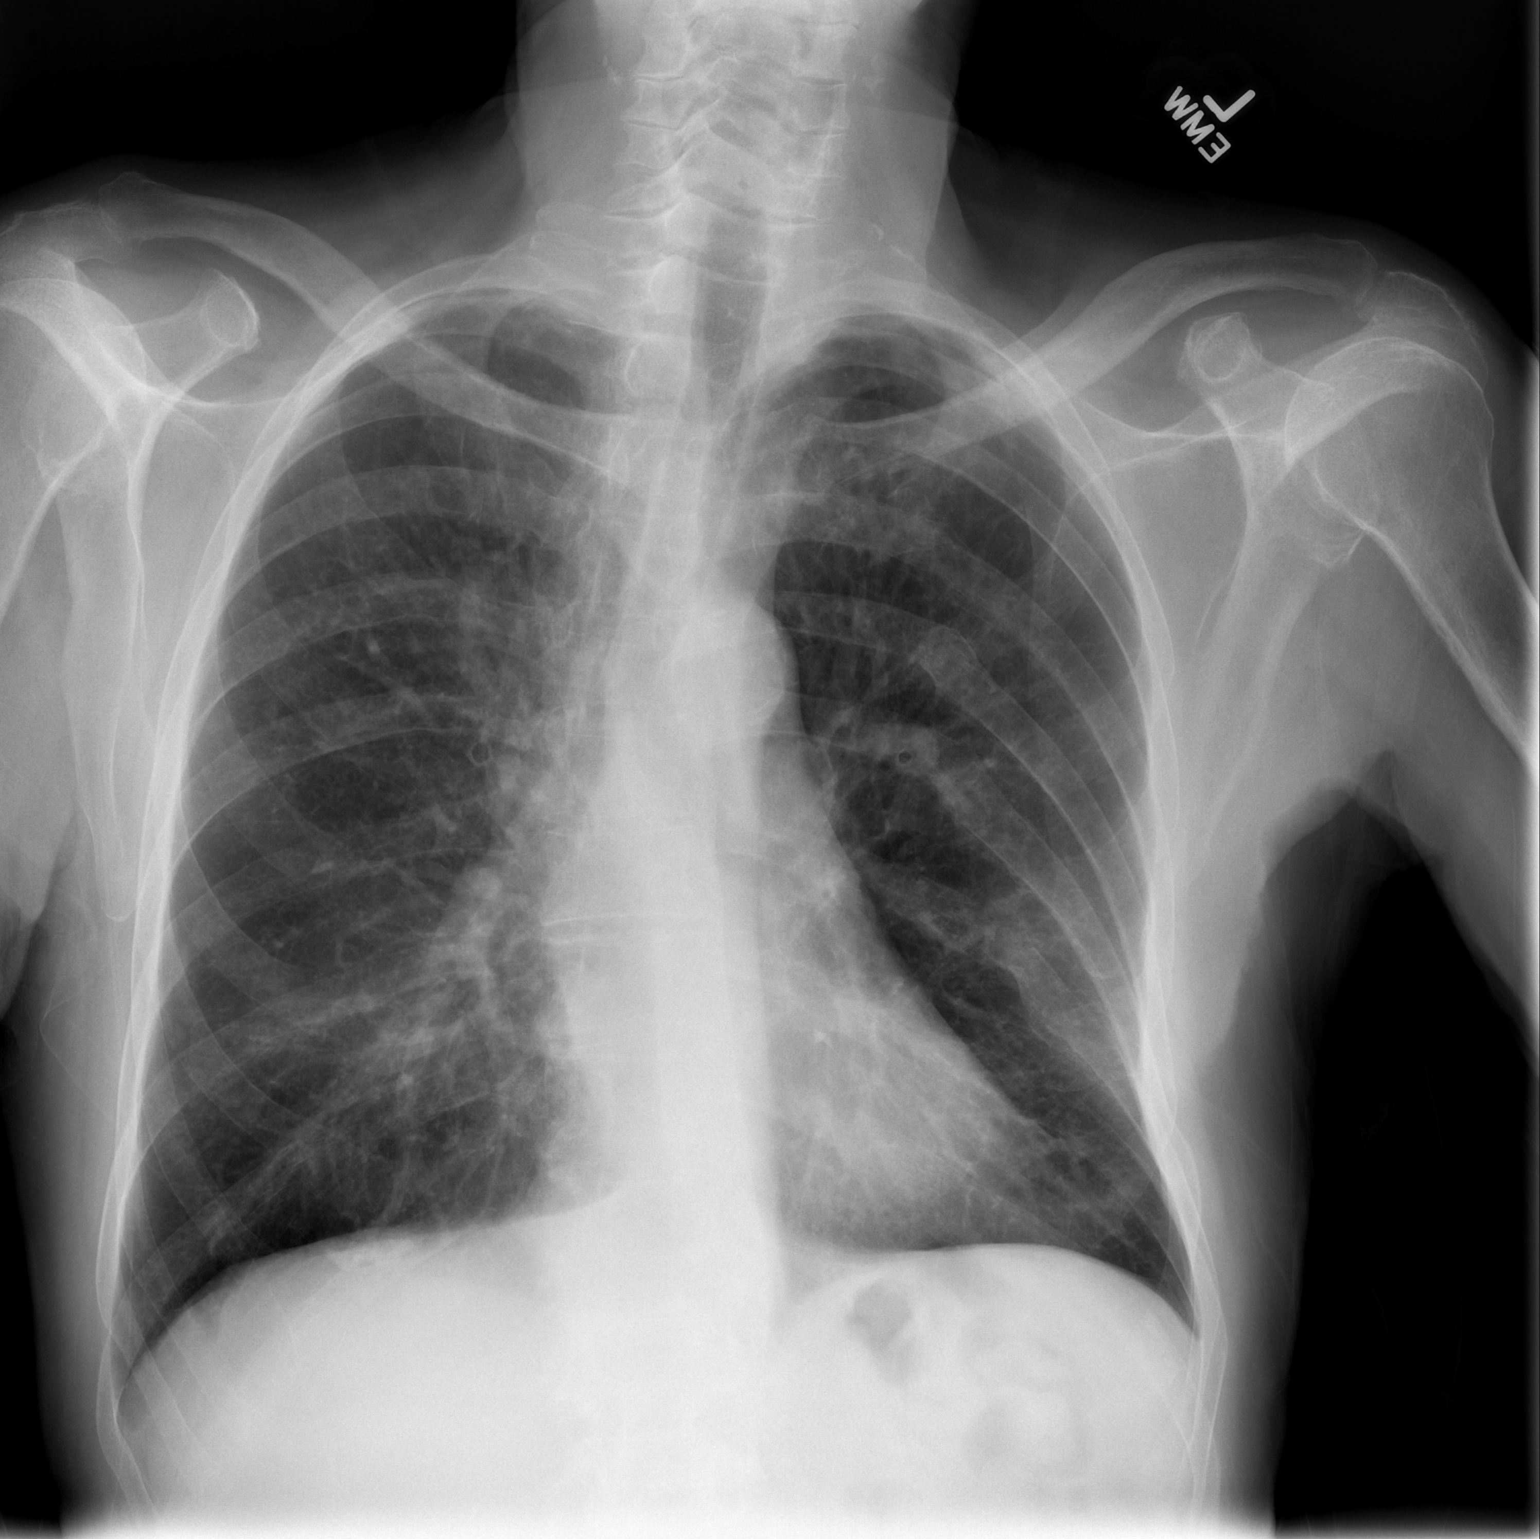

[w chest lat]
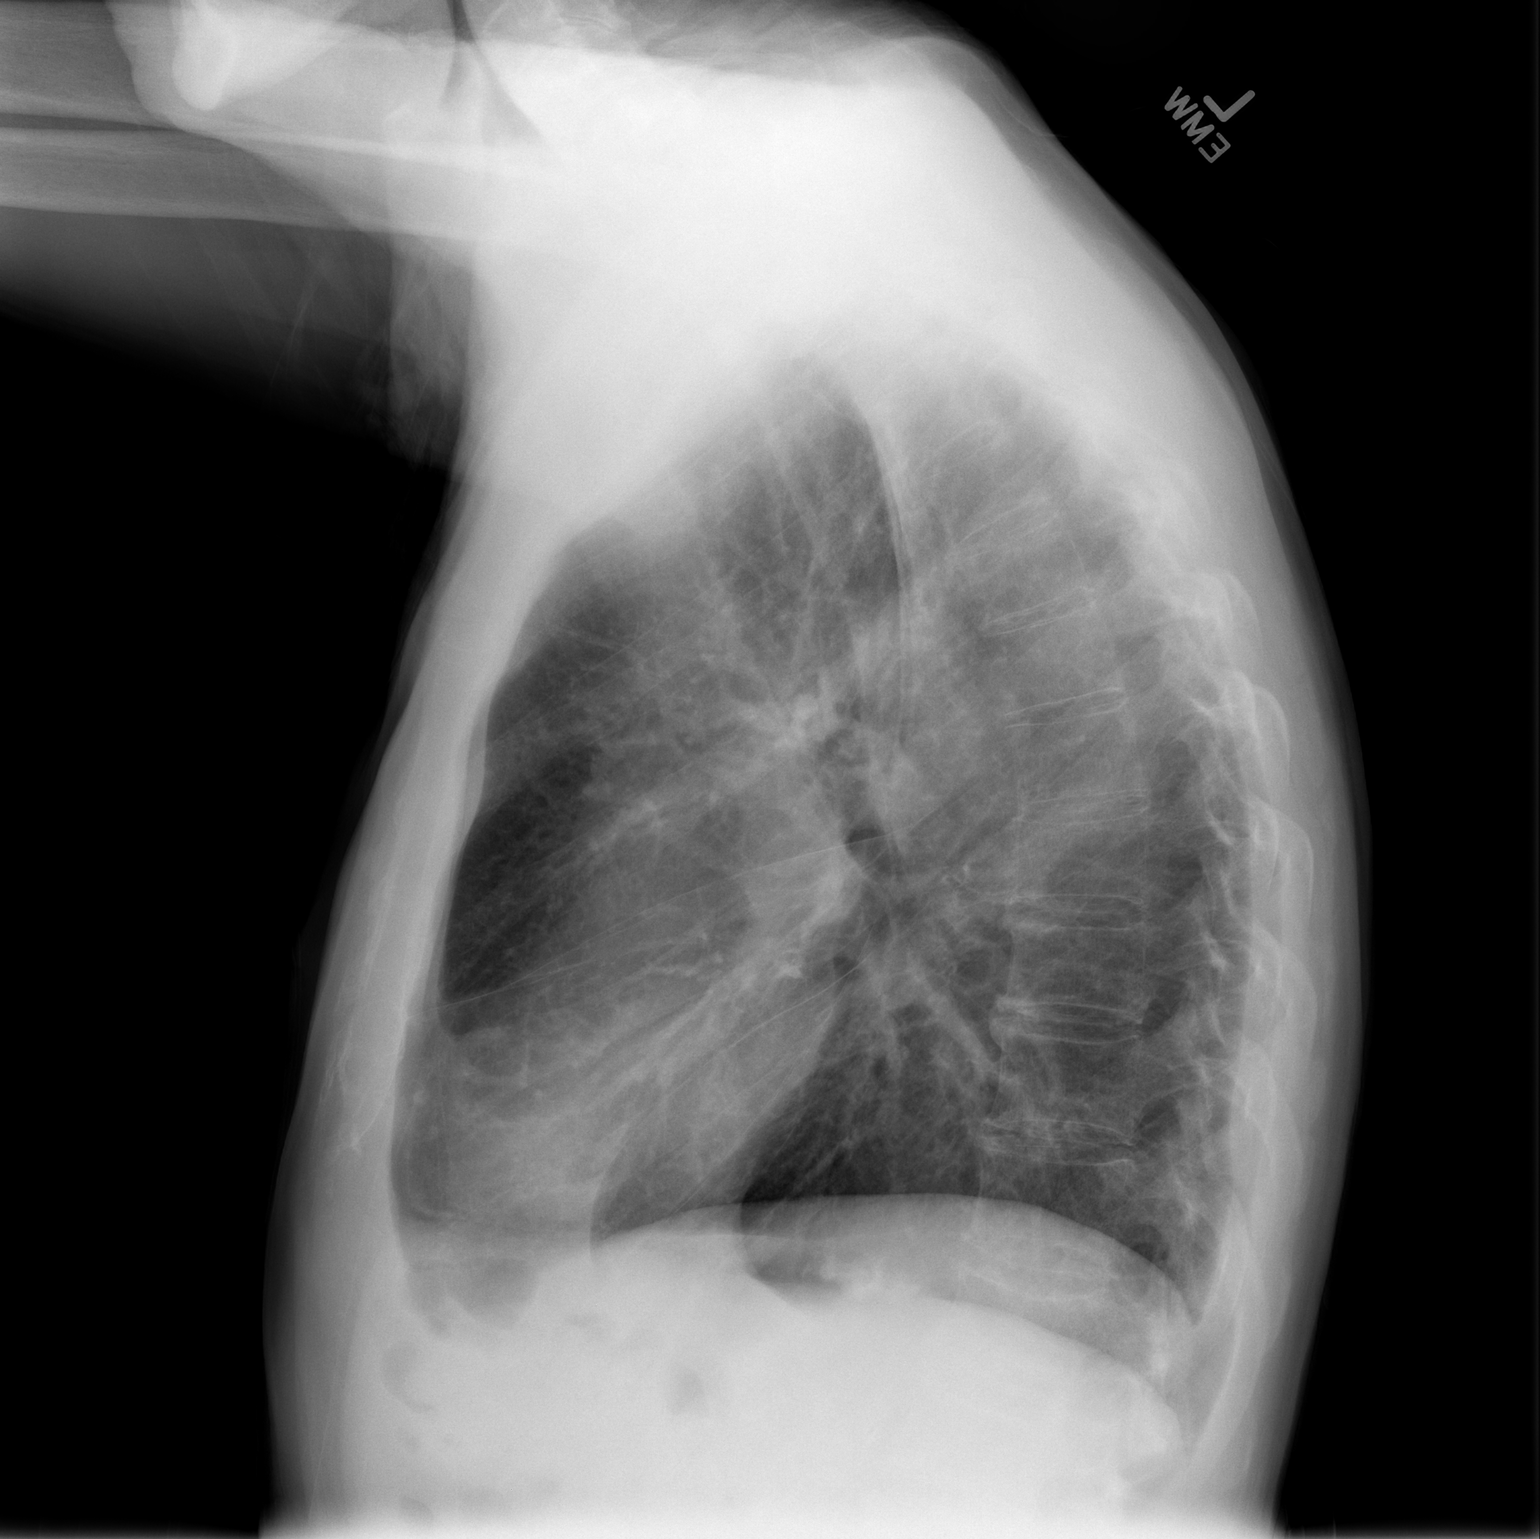

[2 of 2 positions shown; findings below may reference images not displayed]

FINDINGS: The lungs are mildly hyperinflated. The interstitial markings are
coarse. There is no alveolar infiltrate. There is no pleural
effusion or pneumothorax. The cardiac silhouette is normal in size.
The pulmonary vascularity is not engorged. There is stable gentle
curvature of the mid thoracic spine convex toward the right. There
are old fractures of the posterior aspects of the left sixth,
seventh, and eighth ribs.
IMPRESSION: COPD and pulmonary fibrotic change. There is no evidence of
pneumonia nor CHF. One cannot exclude acute bronchitis in the
appropriate clinical setting.

## 2020-01-26 ENCOUNTER — Other Ambulatory Visit: Payer: Self-pay | Admitting: Nurse Practitioner
